# Patient Record
Sex: Female | Born: 1950 | Race: White | Hispanic: No | Marital: Married | Smoking: Never smoker
Health system: Southern US, Community
[De-identification: ages and names within clinical notes are randomized; demographics above are authoritative.]

## PROBLEM LIST (undated history)

## (undated) DIAGNOSIS — I639 Cerebral infarction, unspecified: Secondary | ICD-10-CM

## (undated) DIAGNOSIS — F0390 Unspecified dementia without behavioral disturbance: Secondary | ICD-10-CM

## (undated) DIAGNOSIS — R51 Headache: Secondary | ICD-10-CM

## (undated) DIAGNOSIS — R413 Other amnesia: Secondary | ICD-10-CM

## (undated) HISTORY — DX: Other amnesia: R41.3

## (undated) HISTORY — PX: SKIN CANCER EXCISION: SHX779

## (undated) HISTORY — DX: Headache: R51

## (undated) HISTORY — PX: MOUTH SURGERY: SHX715

---

## 2002-08-29 LAB — CONVERTED CEMR LAB: Pap Smear: NORMAL

## 2008-10-24 ENCOUNTER — Emergency Department (HOSPITAL_COMMUNITY): Admission: EM | Admit: 2008-10-24 | Discharge: 2008-10-24 | Payer: Self-pay | Admitting: Emergency Medicine

## 2009-01-15 ENCOUNTER — Ambulatory Visit: Payer: Self-pay | Admitting: Family Medicine

## 2009-01-16 LAB — CONVERTED CEMR LAB
ALT: 18 units/L (ref 0–35)
AST: 25 units/L (ref 0–37)
Albumin: 4.1 g/dL (ref 3.5–5.2)
Alkaline Phosphatase: 68 units/L (ref 39–117)
BUN: 9 mg/dL (ref 6–23)
Basophils Absolute: 0 10*3/uL (ref 0.0–0.1)
Basophils Relative: 0.4 % (ref 0.0–3.0)
Bilirubin, Direct: 0.1 mg/dL (ref 0.0–0.3)
CO2: 31 meq/L (ref 19–32)
Calcium: 10.1 mg/dL (ref 8.4–10.5)
Chloride: 105 meq/L (ref 96–112)
Cholesterol: 205 mg/dL — ABNORMAL HIGH (ref 0–200)
Creatinine, Ser: 0.8 mg/dL (ref 0.4–1.2)
Direct LDL: 145.3 mg/dL
Eosinophils Absolute: 0.2 10*3/uL (ref 0.0–0.7)
Eosinophils Relative: 3.9 % (ref 0.0–5.0)
Folate: >20 ng/mL
GFR calc non Af Amer: 77.73 mL/min (ref >60)
Glucose, Bld: 83 mg/dL (ref 70–99)
HCT: 40.1 % (ref 36.0–46.0)
HDL: 51.2 mg/dL (ref >39.00)
Hemoglobin: 13.9 g/dL (ref 12.0–15.0)
Lymphocytes Relative: 30.5 % (ref 12.0–46.0)
Lymphs Abs: 1.9 10*3/uL (ref 0.7–4.0)
MCHC: 34.5 g/dL (ref 30.0–36.0)
MCV: 86.2 fL (ref 78.0–100.0)
Monocytes Absolute: 0.5 10*3/uL (ref 0.1–1.0)
Monocytes Relative: 8.4 % (ref 3.0–12.0)
Neutro Abs: 3.7 10*3/uL (ref 1.4–7.7)
Neutrophils Relative %: 56.8 % (ref 43.0–77.0)
Platelets: 256 10*3/uL (ref 150.0–400.0)
Potassium: 3.6 meq/L (ref 3.5–5.1)
RBC: 4.65 M/uL (ref 3.87–5.11)
RDW: 12 % (ref 11.5–14.6)
Sodium: 142 meq/L (ref 135–145)
TSH: 1.32 microintl units/mL (ref 0.35–5.50)
Total Bilirubin: 0.6 mg/dL (ref 0.3–1.2)
Total CHOL/HDL Ratio: 4
Total Protein: 6.7 g/dL (ref 6.0–8.3)
Triglycerides: 62 mg/dL (ref 0.0–149.0)
VLDL: 12.4 mg/dL (ref 0.0–40.0)
Vitamin B-12: 226 pg/mL (ref 211–911)
WBC: 6.3 10*3/uL (ref 4.5–10.5)

## 2009-01-19 LAB — CONVERTED CEMR LAB

## 2009-01-30 ENCOUNTER — Ambulatory Visit: Payer: Self-pay | Admitting: Family Medicine

## 2009-01-31 ENCOUNTER — Encounter: Admission: RE | Admit: 2009-01-31 | Discharge: 2009-01-31 | Payer: Self-pay | Admitting: Family Medicine

## 2009-03-04 ENCOUNTER — Encounter: Payer: Self-pay | Admitting: Family Medicine

## 2009-03-09 ENCOUNTER — Encounter: Payer: Self-pay | Admitting: Family Medicine

## 2009-04-21 ENCOUNTER — Encounter: Payer: Self-pay | Admitting: Family Medicine

## 2009-04-22 ENCOUNTER — Other Ambulatory Visit: Admission: RE | Admit: 2009-04-22 | Discharge: 2009-04-22 | Payer: Self-pay | Admitting: Family Medicine

## 2009-04-22 ENCOUNTER — Encounter: Payer: Self-pay | Admitting: Family Medicine

## 2009-04-22 ENCOUNTER — Ambulatory Visit: Payer: Self-pay | Admitting: Family Medicine

## 2009-04-27 ENCOUNTER — Encounter (INDEPENDENT_AMBULATORY_CARE_PROVIDER_SITE_OTHER): Payer: Self-pay | Admitting: *Deleted

## 2009-04-27 ENCOUNTER — Ambulatory Visit: Payer: Self-pay | Admitting: Family Medicine

## 2009-04-28 ENCOUNTER — Encounter (INDEPENDENT_AMBULATORY_CARE_PROVIDER_SITE_OTHER): Payer: Self-pay | Admitting: *Deleted

## 2009-04-28 ENCOUNTER — Encounter: Admission: RE | Admit: 2009-04-28 | Discharge: 2009-04-28 | Payer: Self-pay | Admitting: Family Medicine

## 2009-04-29 ENCOUNTER — Encounter (INDEPENDENT_AMBULATORY_CARE_PROVIDER_SITE_OTHER): Payer: Self-pay | Admitting: *Deleted

## 2009-05-25 ENCOUNTER — Encounter: Payer: Self-pay | Admitting: Family Medicine

## 2009-06-23 ENCOUNTER — Ambulatory Visit: Payer: Self-pay | Admitting: Family Medicine

## 2009-06-24 ENCOUNTER — Ambulatory Visit: Payer: Self-pay | Admitting: Psychology

## 2009-06-24 LAB — CONVERTED CEMR LAB
ALT: 13 units/L (ref 0–35)
Direct LDL: 160.6 mg/dL
Total CHOL/HDL Ratio: 5
VLDL: 19 mg/dL (ref 0.0–40.0)

## 2009-07-13 ENCOUNTER — Encounter: Payer: Self-pay | Admitting: Family Medicine

## 2009-07-22 ENCOUNTER — Encounter: Payer: Self-pay | Admitting: Family Medicine

## 2010-05-26 ENCOUNTER — Ambulatory Visit: Payer: Self-pay | Admitting: Family Medicine

## 2010-05-28 ENCOUNTER — Encounter: Admission: RE | Admit: 2010-05-28 | Discharge: 2010-05-28 | Payer: Self-pay | Admitting: Family Medicine

## 2010-08-17 ENCOUNTER — Ambulatory Visit: Payer: Self-pay | Admitting: Family Medicine

## 2010-08-23 LAB — CONVERTED CEMR LAB
ALT: 16 units/L (ref 0–35)
BUN: 11 mg/dL (ref 6–23)
Basophils Relative: 0.6 % (ref 0.0–3.0)
Bilirubin, Direct: 0.1 mg/dL (ref 0.0–0.3)
CO2: 30 meq/L (ref 19–32)
Chloride: 107 meq/L (ref 96–112)
Cholesterol: 248 mg/dL — ABNORMAL HIGH (ref 0–200)
Creatinine, Ser: 0.6 mg/dL (ref 0.4–1.2)
Direct LDL: 170.3 mg/dL
Eosinophils Absolute: 0.4 10*3/uL (ref 0.0–0.7)
Eosinophils Relative: 5.8 % — ABNORMAL HIGH (ref 0.0–5.0)
HCT: 41.6 % (ref 36.0–46.0)
Lymphs Abs: 2.4 10*3/uL (ref 0.7–4.0)
MCHC: 34.3 g/dL (ref 30.0–36.0)
MCV: 88.1 fL (ref 78.0–100.0)
Monocytes Absolute: 0.5 10*3/uL (ref 0.1–1.0)
Neutrophils Relative %: 50.3 % (ref 43.0–77.0)
Platelets: 237 10*3/uL (ref 150.0–400.0)
Potassium: 4.1 meq/L (ref 3.5–5.1)
RBC: 4.72 M/uL (ref 3.87–5.11)
TSH: 3.22 microintl units/mL (ref 0.35–5.50)
Total Protein: 6.5 g/dL (ref 6.0–8.3)
WBC: 6.7 10*3/uL (ref 4.5–10.5)

## 2010-08-27 ENCOUNTER — Ambulatory Visit: Payer: Self-pay | Admitting: Family Medicine

## 2010-08-27 LAB — CONVERTED CEMR LAB: Cholesterol, target level: 200 mg/dL

## 2010-09-08 ENCOUNTER — Ambulatory Visit
Admission: RE | Admit: 2010-09-08 | Discharge: 2010-09-08 | Payer: Self-pay | Source: Home / Self Care | Attending: Family Medicine | Admitting: Family Medicine

## 2010-09-08 ENCOUNTER — Other Ambulatory Visit: Payer: Self-pay | Admitting: Family Medicine

## 2010-09-09 LAB — FECAL OCCULT BLOOD, IMMUNOCHEMICAL: Fecal Occult Bld: NEGATIVE

## 2010-09-30 NOTE — Letter (Signed)
Summary: [facility]: Immunoassay Fecal Occult Blood (iFOB) Order Form  [facility]  [address] [place], [place] [address]   Phone: [phone]  Fax: [phone]       Lab: Immunoassay Fecal Occult Blood (iFOB) Order Form   August 27, 2010 MRN: [id number]   Toni Sullivan 1951/08/10   Physicican Name:____________Bedsole_____________  Diagnosis Code:_______________________v76.41___      [name] MD

## 2010-09-30 NOTE — Assessment & Plan Note (Signed)
Summary: FLU SHOT/CLE  Nurse Visit   Allergies: 1)  ! Penicillin  Immunizations Administered:  Influenza Vaccine # 1:    Vaccine Type: Fluvax 3+    Site: left deltoid    Mfr: GlaxoSmithKline    Dose: 0.5 ml    Route: IM    Given by: Mervin Hack CMA (AAMA)    Exp. Date: 02/26/2011    Lot #: [id number]    VIS given: 03/23/10 version given May 26, 2010.  Flu Vaccine Consent Questions:    Do you have a history of severe allergic reactions to this vaccine? no    Any prior history of allergic reactions to egg and/or gelatin? no    Do you have a sensitivity to the preservative Thimersol? no    Do you have a past history of Guillan-Barre Syndrome? no    Do you currently have an acute febrile illness? no    Have you ever had a severe reaction to latex? no    Vaccine information given and explained to patient? yes    Are you currently pregnant? no  Orders Added: 1)  Flu Vaccine 23yrs + [90658] 2)  Admin 1st Vaccine [09811]

## 2010-09-30 NOTE — Assessment & Plan Note (Signed)
Summary: CPX/CLE   Vital Signs:  Patient profile:   60 year old female Height:      64.5 inches Weight:      143.50 pounds BMI:     24.34 Temp:     98.3 degrees F oral Pulse rate:   64 / minute Pulse rhythm:   regular BP sitting:   110 / 80  (left arm) Cuff size:   regular  Vitals Entered By: Benny Lennert CMA Duncan Dull) (August 27, 2010 2:00 PM)  History of Present Illness: Chief complaint cpx  60 year old female wih history of TIA and early memory loss...seeing neuro...dx with early Alzheimer's dz. On exelon and axona...stable..no progression.     Has had to quit work, husband has retired. Next appt with Dr. Vickey Huger in 10/2010. Walking daily. Able to ADLs and driving... main problem is word finding.   Doing fairly well without depression... occ crying spells goes away in 24 hours.  No SI.  Was on zocor 40 for cholesterol.. worsened control since last check.. Goal LDL <70. Does not appear she is taking cholesterol med.   Was not at goal on simvastatin previously...Marland Kitchenno SE.            Lipid Management History:      Positive NCEP/ATP III risk factors include female age 77 years old or older.  Negative NCEP/ATP III risk factors include non-tobacco-user status.     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 As Directed On Hands 2)  Exelon 9.5 Mg/24hr Pt24 (Rivastigmine) .... Once Daily 3)  Axona  Pack (Dietary Management Product) .... 4 8 Ounces Daily 4)  Omega-3 350 Mg Caps (Omega-3 Fatty Acids) .... One Tablet Daily 5)  Algal-900 Dha 300 Mg Caps (Docosahexaenoic Acid)  .... Daily 6)  Aspir-Low 81 Mg Tbec (Aspirin) .... Daily 7)  Vitamin D3 400 Unit Tabs (Cholecalciferol) .... Daily  Allergies: 1)  ! Penicillin  Past History:  Past medical, surgical, family and social histories (including risk factors) reviewed, and no changes noted (except as noted below).  Past Surgical History: Reviewed history from 03/27/2009 and no changes required. 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 Donzetta Starch EEG negative for seizure 02/2009  Family History: Reviewed history from 01/15/2009 and no changes required. father: unknown mother: heathy at age 3 brother: healthy no CAD, no  cancer  Social History: Reviewed history from 01/15/2009 and no changes required. Occupation: Technical brewer Married 2 children: healthy..except son alcoholic Never Smoked Alcohol use-yes, wine 2-3 times a month Drug use-no Regular exercise-yes, 3 times a week Diet: fruits and veggies, water, weight watcher dinner's  Review of Systems General:  Denies fatigue and fever. CV:  Denies chest pain or discomfort. Resp:  Denies shortness of breath. GI:  Denies abdominal pain. GU:  Denies dysuria.  Physical Exam  General:  Well-developed,well-nourished,in no acute distress; alert,appropriate and cooperative throughout examination Eyes:  No corneal or conjunctival inflammation noted. EOMI. Perrla. Funduscopic exam benign, without hemorrhages, exudates or papilledema. Vision grossly normal. Ears:  External ear exam shows no significant lesions or deformities.  Otoscopic examination reveals clear canals, tympanic membranes are intact  bilaterally without bulging, retraction, inflammation or discharge. Hearing is grossly normal bilaterally. Nose:  External nasal examination shows no deformity or inflammation. Nasal mucosa are pink and moist without lesions or exudates. Mouth:  Oral mucosa and oropharynx without lesions or exudates.  Teeth in good repair. Neck:  no carotid bruit or  thyromegaly no cervical or supraclavicular lymphadenopathy  Breasts:  per GYN Lungs:  Normal respiratory effort, chest expands symmetrically. Lungs are clear to auscultation, no crackles or wheezes. Heart:  Normal rate and regular rhythm. S1 and S2 normal without gallop, murmur, click, rub or other extra sounds. Abdomen:  Bowel sounds positive,abdomen soft and non-tender without masses, organomegaly or hernias noted. Genitalia:  per GYN Pulses:  R and L posterior tibial pulses are full and equal bilaterally  Extremities:  no edema  Neurologic:  No cranial nerve deficits noted.  Constantly asking about "whats the thing called.Marland Kitchen"Station and gait are normal. DTRs are symmetrical throughout. Sensory, motor and coordinative functions appear intact. Skin:  Intact without suspicious lesions or rashes Psych:  Cognition and judgment appear intact. Alert and cooperative with normal attention span and concentration. 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 Continue exelon and axona per Neuro. Reviewed notes in detail.   Problem # 2:  HYPERLIPIDEMIA (ICD-272.4) Poor control on or off simvastatin. Goal LDL <70. Start back on chol med at higher dose.. 80 mg daily.  The following medications were removed from the medication list:    Zocor 40 Mg Tabs (Simvastatin) .Marland Kitchen... Take 1 at bedtime daily Her updated medication list for this problem includes:    Lipitor 80 Mg Tabs (Atorvastatin calcium) .Marland Kitchen... 1 tab by mouth daily  Problem # 3:  SCREENING FOR MALIGNANT NEOPLASM OF THE RECTUM (ICD-V76.41) Stool cards.   Complete Medication List: 1)  Tretinoin 0.05 % Crea (Tretinoin) .... As directed on hands 2)  Exelon 9.5 Mg/24hr Pt24 (Rivastigmine) .... Once daily 3)  Axona Pack (Dietary management product) .... 4 8 ounces daily 4)  Omega-3 350 Mg Caps (Omega-3 fatty acids) .... One  tablet daily 5)  Algal-900 Dha 300 Mg Caps (Docosahexaenoic acid) .... Daily 6)  Aspir-low 81 Mg Tbec (Aspirin) .... Daily 7)  Vitamin D3 400 Unit Tabs (Cholecalciferol) .... Daily 8)  Lipitor 80 Mg Tabs (Atorvastatin calcium) .Marland Kitchen.. 1 tab by mouth daily  Lipid Assessment/Plan:      Based on NCEP/ATP III, the patient's risk factor category is "0-1 risk factors".  The patient's lipid goals are as follows: Total cholesterol goal is 200; LDL cholesterol goal is 160; HDL cholesterol goal is 40; Triglyceride goal is 150.  Her LDL cholesterol goal has not been met.    Patient Instructions: 1)  Stop simvastatin (zocor).. .change to lipitor 80 mg daily. 2)  Recheck fasting LIPIDS, AST,ALT  in 3 months Dx 272.0    3)   Return stool cards.  4)  Call insurance about whether they cover shingles vaccine.. call office if interested.  5)  Please schedule a follow-up appointment in 1 year.  Prescriptions: LIPITOR 80 MG TABS (ATORVASTATIN CALCIUM) 1 tab by mouth daily  #30 x 11   Entered and Authorized by:   [name] MD   Signed by:   [name] MD on 08/27/2010   Method used:   Electronically to        [facility]  [address] [phone]* (retail)  [address]       [place], [place]  [address]       Ph: [phone] or [phone]       Fax: [phone]   RxID:   [id number]    Orders Added: 1)  Est. Patient Level IV [27253]    Current Allergies (reviewed today): ! 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 Donzetta Starch       EEG negative for seizure 02/2009   Colonoscopy Next Due:  Refused Last PAP:  NEGATIVE FOR INTRAEPITHELIAL LESIONS OR MALIGNANCY. (04/22/2009 12:00:00 AM) PAP Result Date:  04/29/2010 PAP Result:  normal per GYN PAP Next Due:  1 yr Last Mammogram:  ASSESSMENT: Negative - BI-RADS 1^MM DIGITAL SCREENING (05/28/2010 2:12:00 PM) Mammogram Next Due:  1 yr

## 2010-11-25 ENCOUNTER — Other Ambulatory Visit: Payer: Self-pay | Admitting: Family Medicine

## 2010-11-25 DIAGNOSIS — E7800 Pure hypercholesterolemia, unspecified: Secondary | ICD-10-CM

## 2010-11-26 ENCOUNTER — Other Ambulatory Visit (INDEPENDENT_AMBULATORY_CARE_PROVIDER_SITE_OTHER): Payer: 59 | Admitting: Family Medicine

## 2010-11-26 DIAGNOSIS — E7800 Pure hypercholesterolemia, unspecified: Secondary | ICD-10-CM

## 2010-11-26 LAB — LIPID PANEL
Cholesterol: 142 mg/dL (ref 0–200)
HDL: 60.1 mg/dL (ref >39.00)
LDL Cholesterol: 74 mg/dL (ref 0–99)
VLDL: 7.6 mg/dL (ref 0.0–40.0)

## 2010-11-26 LAB — ALT: ALT: 24 U/L (ref 0–35)

## 2011-06-20 ENCOUNTER — Other Ambulatory Visit: Payer: Self-pay | Admitting: Family Medicine

## 2011-06-20 DIAGNOSIS — Z1231 Encounter for screening mammogram for malignant neoplasm of breast: Secondary | ICD-10-CM

## 2011-06-24 ENCOUNTER — Ambulatory Visit (HOSPITAL_COMMUNITY): Payer: 59

## 2011-07-12 ENCOUNTER — Ambulatory Visit (HOSPITAL_COMMUNITY)
Admission: RE | Admit: 2011-07-12 | Discharge: 2011-07-12 | Disposition: A | Discharge status: Home / Self Care | Payer: 59 | Source: Ambulatory Visit | Attending: Family Medicine | Admitting: Family Medicine

## 2011-07-12 DIAGNOSIS — Z1231 Encounter for screening mammogram for malignant neoplasm of breast: Secondary | ICD-10-CM

## 2011-09-09 ENCOUNTER — Other Ambulatory Visit: Payer: Self-pay | Admitting: Family Medicine

## 2012-02-28 ENCOUNTER — Other Ambulatory Visit: Payer: Self-pay | Admitting: Family Medicine

## 2012-07-17 ENCOUNTER — Other Ambulatory Visit: Payer: Self-pay | Admitting: Family Medicine

## 2012-07-19 ENCOUNTER — Telehealth: Payer: Self-pay | Admitting: Family Medicine

## 2012-07-19 ENCOUNTER — Other Ambulatory Visit (INDEPENDENT_AMBULATORY_CARE_PROVIDER_SITE_OTHER): Payer: 59

## 2012-07-19 DIAGNOSIS — E785 Hyperlipidemia, unspecified: Secondary | ICD-10-CM

## 2012-07-19 LAB — LIPID PANEL
HDL: 55.8 mg/dL (ref >39.00)
Total CHOL/HDL Ratio: 2
Triglycerides: 69 mg/dL (ref 0.0–149.0)
VLDL: 13.8 mg/dL (ref 0.0–40.0)

## 2012-07-19 LAB — COMPREHENSIVE METABOLIC PANEL
AST: 31 U/L (ref 0–37)
Albumin: 3.9 g/dL (ref 3.5–5.2)
Alkaline Phosphatase: 80 U/L (ref 39–117)
BUN: 11 mg/dL (ref 6–23)
Potassium: 3.8 mEq/L (ref 3.5–5.1)
Sodium: 142 mEq/L (ref 135–145)
Total Bilirubin: 0.7 mg/dL (ref 0.3–1.2)

## 2012-07-19 NOTE — Telephone Encounter (Signed)
Message copied by Excell Seltzer on Thu Jul 19, 2012  9:35 AM ------      Message from: Alvina Chou      Created: Tue Jul 10, 2012  3:56 PM      Regarding: lab orders for Thursday, 11.21.13       Patient is scheduled for CPX labs, please order future labs, Thanks , Camelia Eng

## 2012-07-27 ENCOUNTER — Ambulatory Visit (INDEPENDENT_AMBULATORY_CARE_PROVIDER_SITE_OTHER): Payer: 59 | Admitting: Family Medicine

## 2012-07-27 ENCOUNTER — Encounter: Payer: Self-pay | Admitting: Family Medicine

## 2012-07-27 VITALS — BP 102/60 | HR 69 | Temp 98.1°F | Ht 64.5 in | Wt 134.5 lb

## 2012-07-27 DIAGNOSIS — R531 Weakness: Secondary | ICD-10-CM | POA: Insufficient documentation

## 2012-07-27 DIAGNOSIS — F0280 Dementia in other diseases classified elsewhere without behavioral disturbance: Secondary | ICD-10-CM

## 2012-07-27 DIAGNOSIS — Z23 Encounter for immunization: Secondary | ICD-10-CM

## 2012-07-27 DIAGNOSIS — N959 Unspecified menopausal and perimenopausal disorder: Secondary | ICD-10-CM

## 2012-07-27 DIAGNOSIS — Z1231 Encounter for screening mammogram for malignant neoplasm of breast: Secondary | ICD-10-CM

## 2012-07-27 DIAGNOSIS — G309 Alzheimer's disease, unspecified: Secondary | ICD-10-CM

## 2012-07-27 DIAGNOSIS — E785 Hyperlipidemia, unspecified: Secondary | ICD-10-CM

## 2012-07-27 DIAGNOSIS — R5381 Other malaise: Secondary | ICD-10-CM

## 2012-07-27 DIAGNOSIS — Z1212 Encounter for screening for malignant neoplasm of rectum: Secondary | ICD-10-CM

## 2012-07-27 DIAGNOSIS — Z780 Asymptomatic menopausal state: Secondary | ICD-10-CM

## 2012-07-27 DIAGNOSIS — Z2911 Encounter for prophylactic immunotherapy for respiratory syncytial virus (RSV): Secondary | ICD-10-CM

## 2012-07-27 DIAGNOSIS — Z0000 Encounter for general adult medical examination without abnormal findings: Secondary | ICD-10-CM

## 2012-07-27 LAB — CBC WITH DIFFERENTIAL/PLATELET
Basophils Relative: 0.5 % (ref 0.0–3.0)
Eosinophils Absolute: 0.2 10*3/uL (ref 0.0–0.7)
HCT: 43.2 % (ref 36.0–46.0)
Lymphs Abs: 2.3 10*3/uL (ref 0.7–4.0)
MCHC: 32.5 g/dL (ref 30.0–36.0)
MCV: 89.8 fl (ref 78.0–100.0)
Monocytes Absolute: 0.5 10*3/uL (ref 0.1–1.0)
Neutrophils Relative %: 58 % (ref 43.0–77.0)
Platelets: 248 10*3/uL (ref 150.0–400.0)
RBC: 4.81 Mil/uL (ref 3.87–5.11)

## 2012-07-27 LAB — TSH: TSH: 1.35 u[IU]/mL (ref 0.35–5.50)

## 2012-07-27 NOTE — Addendum Note (Signed)
Addended by: Consuello Masse on: 07/27/2012 11:22 AM   Modules accepted: Orders

## 2012-07-27 NOTE — Progress Notes (Signed)
Subjective:    Patient ID: Toni Sullivan, female    DOB: 1950-09-13, 61 y.o.   MRN: [id number]  HPI  61 year old female with early Alzheimer's Followed by Neuro presents for CPX.  On exelon, axona and namenda for Alzheimer's  Pt's husband here today. Neuro is Dr. Vickey Huger.  Some progression. She cleans.  watches TV. Has stopped golf due to decreased energy. Sleeps well at night. No apnea spells.   Diet: limited...she picks at her food and eats little. Exercise: 2-3 miles 2 times a week.   Wt Readings from Last 3 Encounters:  07/27/12 134 lb 8 oz (61.009 kg)  08/27/10 143 lb 8 oz (65.091 kg)  04/22/09 144 lb 2.1 oz (65.378 kg)     Elevated Cholesterol: Well controlled with TIA history on atorvastatin Lab Results  Component Value Date   CHOL 133 07/19/2012   HDL 55.80 07/19/2012   LDLCALC 63 07/19/2012   LDLDIRECT 170.3 08/17/2010   TRIG 69.0 07/19/2012   CHOLHDL 2 07/19/2012    Using medications without problems: Muscle aches:  Diet compliance: Exercise: Other complaints:   Review of Systems  Constitutional: Positive for fatigue. Negative for fever and unexpected weight change.  HENT: Negative for ear pain, congestion, sore throat, sneezing, trouble swallowing and sinus pressure.   Eyes: Negative for pain and itching.  Respiratory: Negative for cough, shortness of breath and wheezing.   Cardiovascular: Negative for chest pain, palpitations and leg swelling.  Gastrointestinal: Negative for nausea, abdominal pain, diarrhea, constipation and blood in stool.  Genitourinary: Negative for dysuria, hematuria, vaginal discharge, difficulty urinating and menstrual problem.  Skin: Negative for rash.  Neurological: Positive for weakness. Negative for syncope, light-headedness, numbness and headaches.  Psychiatric/Behavioral: Negative for confusion and dysphoric mood. The patient is not nervous/anxious.        Objective:   Physical Exam  Constitutional: Vital signs are  normal. She appears well-developed and well-nourished. She is cooperative.  Non-toxic appearance. She does not appear ill. No distress.  HENT:  Head: Normocephalic.  Right Ear: Hearing, tympanic membrane, external ear and ear canal normal.  Left Ear: Hearing, tympanic membrane, external ear and ear canal normal.  Nose: Nose normal.  Eyes: Conjunctivae normal, EOM and lids are normal. Pupils are equal, round, and reactive to light. No foreign bodies found.  Neck: Trachea normal and normal range of motion. Neck supple. Carotid bruit is not present. No mass and no thyromegaly present.  Cardiovascular: Normal rate, regular rhythm, S1 normal, S2 normal, normal heart sounds and intact distal pulses.  Exam reveals no gallop.   No murmur heard. Pulmonary/Chest: Effort normal and breath sounds normal. No respiratory distress. She has no wheezes. She has no rhonchi. She has no rales.  Abdominal: Soft. Normal appearance and bowel sounds are normal. She exhibits no distension, no fluid wave, no abdominal bruit and no mass. There is no hepatosplenomegaly. There is no tenderness. There is no rebound, no guarding and no CVA tenderness. No hernia.  Genitourinary: Vagina normal and uterus normal. No breast swelling, tenderness, discharge or bleeding. Pelvic exam was performed with patient prone. There is no rash, tenderness or lesion on the right labia. There is no rash, tenderness or lesion on the left labia. Uterus is not enlarged and not tender. Right adnexum displays no mass, no tenderness and no fullness. Left adnexum displays no mass, no tenderness and no fullness.  Lymphadenopathy:    She has no cervical adenopathy.    She has no axillary adenopathy.  Neurological: She is alert. She has normal strength. No cranial nerve deficit or sensory deficit.  Skin: Skin is warm, dry and intact. No rash noted.  Psychiatric: Her speech is normal and behavior is normal. Judgment normal. Her mood appears not anxious.  Cognition and memory are normal. She does not exhibit a depressed mood.          Assessment & Plan:  The patient's preventative maintenance and recommended screening tests for an annual wellness exam were reviewed in full today. Brought up to date unless services declined.  Counselled on the importance of diet, exercise, and its role in overall health and mortality. The patient's FH and SH was reviewed, including their home life, tobacco status, and drug and alcohol status.   Vaccines: uptodate with TD,  flu and  wil get shingles vaccine today .Marland Kitchen  PAP/DVE: last pap 2011, on q 3 year schedule, DVE yearly  Mammo: will schedule DEXA: will schedule  Colon: ifob Nonsmoker

## 2012-07-27 NOTE — Patient Instructions (Addendum)
Stop at front desk for referral for mammogram and bone density.

## 2012-07-27 NOTE — Assessment & Plan Note (Signed)
Eval with labs. This is more decrease in strenght than fatogue.. Mentioned for them to discuss it with Dr. Vickey Huger as well.

## 2012-07-27 NOTE — Assessment & Plan Note (Signed)
Encouraged exercise, weight loss, healthy eating habits. Well controlled. Continue current medication.  

## 2012-08-20 ENCOUNTER — Other Ambulatory Visit: Payer: Self-pay | Admitting: Family Medicine

## 2012-09-11 ENCOUNTER — Encounter: Payer: Self-pay | Admitting: *Deleted

## 2012-09-11 ENCOUNTER — Ambulatory Visit
Admission: RE | Admit: 2012-09-11 | Discharge: 2012-09-11 | Disposition: A | Discharge status: Home / Self Care | Payer: 59 | Source: Ambulatory Visit | Attending: Family Medicine | Admitting: Family Medicine

## 2012-09-11 ENCOUNTER — Encounter: Payer: Self-pay | Admitting: Family Medicine

## 2012-09-11 DIAGNOSIS — N959 Unspecified menopausal and perimenopausal disorder: Secondary | ICD-10-CM

## 2012-09-11 DIAGNOSIS — Z1231 Encounter for screening mammogram for malignant neoplasm of breast: Secondary | ICD-10-CM

## 2012-09-21 ENCOUNTER — Other Ambulatory Visit: Payer: Self-pay | Admitting: Family Medicine

## 2012-10-23 ENCOUNTER — Other Ambulatory Visit: Payer: Self-pay | Admitting: Family Medicine

## 2012-11-16 ENCOUNTER — Other Ambulatory Visit: Payer: Self-pay | Admitting: Neurology

## 2012-11-20 ENCOUNTER — Other Ambulatory Visit: Payer: Self-pay | Admitting: Family Medicine

## 2012-12-10 ENCOUNTER — Other Ambulatory Visit: Payer: Self-pay | Admitting: Neurology

## 2012-12-22 ENCOUNTER — Other Ambulatory Visit: Payer: Self-pay | Admitting: Family Medicine

## 2013-01-03 ENCOUNTER — Other Ambulatory Visit: Payer: Self-pay

## 2013-01-03 MED ORDER — RIVASTIGMINE 13.3 MG/24HR TD PT24: 13.3000 mg | MEDICATED_PATCH | Freq: Every day | TRANSDERMAL | Status: DC

## 2013-01-29 ENCOUNTER — Other Ambulatory Visit: Payer: Self-pay | Admitting: Family Medicine

## 2013-03-03 ENCOUNTER — Other Ambulatory Visit: Payer: Self-pay | Admitting: Family Medicine

## 2013-04-01 ENCOUNTER — Other Ambulatory Visit: Payer: Self-pay | Admitting: Family Medicine

## 2013-05-09 ENCOUNTER — Ambulatory Visit: Payer: Self-pay | Admitting: Nurse Practitioner

## 2013-06-01 ENCOUNTER — Other Ambulatory Visit: Payer: Self-pay | Admitting: Family Medicine

## 2013-06-29 ENCOUNTER — Other Ambulatory Visit: Payer: Self-pay | Admitting: Neurology

## 2013-07-03 ENCOUNTER — Encounter: Payer: Self-pay | Admitting: Nurse Practitioner

## 2013-07-03 ENCOUNTER — Encounter (INDEPENDENT_AMBULATORY_CARE_PROVIDER_SITE_OTHER): Payer: Self-pay

## 2013-07-03 ENCOUNTER — Ambulatory Visit (INDEPENDENT_AMBULATORY_CARE_PROVIDER_SITE_OTHER): Payer: Medicare Other | Admitting: Nurse Practitioner

## 2013-07-03 VITALS — BP 106/72 | HR 58 | Ht 65.5 in | Wt 133.0 lb

## 2013-07-03 DIAGNOSIS — F0280 Dementia in other diseases classified elsewhere without behavioral disturbance: Secondary | ICD-10-CM

## 2013-07-03 DIAGNOSIS — I6350 Cerebral infarction due to unspecified occlusion or stenosis of unspecified cerebral artery: Secondary | ICD-10-CM

## 2013-07-03 DIAGNOSIS — Z8673 Personal history of transient ischemic attack (TIA), and cerebral infarction without residual deficits: Secondary | ICD-10-CM | POA: Insufficient documentation

## 2013-07-03 DIAGNOSIS — F05 Delirium due to known physiological condition: Secondary | ICD-10-CM

## 2013-07-03 MED ORDER — MEMANTINE HCL ER 28 MG PO CP24: 28.0000 mg | ORAL_CAPSULE | Freq: Every day | ORAL | Status: DC

## 2013-07-03 MED ORDER — RIVASTIGMINE 13.3 MG/24HR TD PT24: 13.3000 mg | MEDICATED_PATCH | Freq: Every day | TRANSDERMAL | Status: DC

## 2013-07-03 NOTE — Progress Notes (Signed)
[facility]  Sullivan: CHASITIE Toni Sullivan DOB: 1951-04-08   REASON FOR VISIT: Followup for Alzheimer's disease   HISTORY OF PRESENT ILLNESS: Toni Sullivan, 62 year old female returns for followup with Toni Sullivan for memory loss. Toni memory loss has been going on for about 3 years and she is disabled from working. He claims she has no problems with memory at home however she scored poorly on Mini-Mental Status exam at last visit, 9/30 and is crying and refuses Toni test today. Sullivan denies any behavior issues, no wondering, sleeping well at night. Appetite is only fair. He was unable to obtain Namenda XR so he stopped Toni medication. She remains on Toni Exelon patch.    11-11-11 Toni. Sullivan here for memory re evaluation- and states she scored only 9 points on MMSE and 4 on AFT because "Toni Sullivan made Toni mad". She no longer cooks, Toni Sullivan does, she plays golf and walks , she reads sometimes, not longer every day Toni Sullivan added. Never did balance Toni check book. He feels she is more impulsive. He is surprisingly supportive of Toni still driving at this advanced stage of dementia, which is characterized by more agitation and aphasia.  04/26/12. Returns for followup unaccompanied.  She feels Toni memory is stable, says she continues to drive to places in Toni community, very short distances, has not gotten lost. Denies any behavior issues, no wandering, sleeping well at night. See ROS.  HISTORY:This 62 year old, Caucasian female, returns for follow-up for memory loss beginning about 2 years ago, getting progressively worse. She has seen Dr. Leonides Cave, whose detailed neuropsychological battery test was showing a likely Alzheimer's dementia with Toni Sullivan scoring under average in parts of Toni test that she would be expected to score higher (by profession and education). She presents without major vascular component, nor Toni clinical manifestations of Lewy body type.  She has trouble  spelling, getting Toni password to Toni computer and calculation is impaired. Aricept gave Toni severe headaches. Toni MMSE scores have fluctuated. She is no longer working. She had more of an episodic character of confusion, disorientation at work, forgetting Toni password, Toni way to fill out a form that must be familiar to Toni. She got a bit better in feeling not so much under stress after quitting work.  She denies headaches.  Still has a problem with spelling.   She was  evaluated by Dr. Leonides Cave  October 2011. Dr. Albertina Senegal' office show normal labs, MRI in normal limits but MMT 23/30 in early June 2010. She had one spell of severe vertigo in March and was treated in Toni ER at [facility], placed on Meclizine.  She sometimes has noticed a flickering light in Toni peripheral visual field and these are not followed by a headache or dizziness. She is no longer on Meclizine. She continues to drive without difficulty.  She is accompanied today by Toni Sullivan.   She feels Toni combination of Axona and Exelon patches have stabilized Toni memory.  She continues to have problems when she feels rushed to do things especially by Toni Sullivan. Pt says she is playing golf weekly. She is also walking. She denies depression.  REVIEW OF SYSTEMS: Full 14 system review of systems performed and notable only for:  Constitutional: N/A  Cardiovascular: N/A  Ear/Nose/Throat: N/A  Skin: N/A  Eyes: N/A  Respiratory: N/A  Gastroitestinal: N/A  Hematology/Lymphatic: N/A  Endocrine: N/A Musculoskeletal:N/A  Allergy/Immunology: N/A  Neurological: Memory loss , confusion  Psychiatric: N/A  ALLERGIES: Allergies  Allergen Reactions  . Penicillins     HOME MEDICATIONS: Outpatient Prescriptions Prior to Visit  Medication Sig Dispense Refill  . atorvastatin (LIPITOR) 80 MG tablet TAKE 1 TABLET BY MOUTH ONCE A DAY  30 tablet  2  . Dietary Management Product (AXONA) packet 1 PACKET OF POWDER MIXED DAILY.  30 packet  12  . EXELON 13.3  MG/24HR PT24 PLACE 1 PATCH (13.3 MG TOTAL) ONTO Toni SKIN DAILY.  90 patch  1  . aspirin 81 MG tablet Take 81 mg by mouth daily.      . Docosahexaenoic Acid (ALGAL-900 DHA) 300 MG CAPS Take by mouth.      Marland Kitchen NAMENDA XR 28 MG CP24 TAKE 1 CAPSULE BY MOUTH ONCE A DAY  30 capsule  11  . tretinoin (RETIN-A) 0.05 % cream Apply topically at bedtime.       No facility-administered medications prior to visit.    PAST MEDICAL HISTORY: History reviewed. No pertinent past medical history.  PAST SURGICAL HISTORY: Past Surgical History  Procedure Laterality Date  . Skin cancer excision      FAMILY HISTORY: History reviewed. No pertinent family history.  SOCIAL HISTORY: History   Social History  . Marital Status: Married    Spouse Name: Earlene Plater    Number of Children: 2  . Years of Education: 12   Occupational History  . retired    Social History Main Topics  . Smoking status: Never Smoker   . Smokeless tobacco: Never Used  . Alcohol Use: 0.0 oz/week     Comment: 1 glass daily  . Drug Use: No  . Sexual Activity: Not on file   Other Topics Concern  . Not on file   Social History Narrative   Sullivan is married Earlene Plater) and lives with Toni Sullivan.   Sullivan has two children.   Sullivan is retired.   Sullivan has a high school education.   Sullivan is right-handed.   Sullivan drinks 5-6 cups of coffee daily.           PHYSICAL EXAM  Filed Vitals:   07/03/13 1403  BP: 106/72  Pulse: 58  Height: 5' 5.5" (1.664 m)  Weight: 133 lb (60.328 kg)   Body mass index is 21.79 kg/(m^2).  Generalized: Well developed, in no acute distress  Head: normocephalic and atraumatic,. Oropharynx benign  Neck: Supple, no carotid bruits  Cardiac: Regular rate rhythm, no murmur   Neurological examination   Mentation: Alert oriented to time, place, history taking. Follows all commands speech and language fluent  Cranial nerve II-XII: Pupils were equal round reactive to light extraocular  movements were full, visual field were full on confrontational test. Facial sensation and strength were normal. hearing was intact to finger rubbing bilaterally. Uvula tongue midline. head turning and shoulder shrug and were normal and symmetric.Tongue protrusion into cheek strength was normal. Motor: normal bulk and tone, full strength in Toni BUE, BLE, fine finger movements normal, no pronator drift. No focal weakness Coordination: finger-nose-finger, heel-to-shin bilaterally, apraxia with extremities  Reflexes: 1+ upper  lower and symmetric Gait and Station: Rising up from seated position without assistance, normal stance,, moderate stride, good arm swing, smooth turning, able to perform tiptoe, and heel walking without difficulty. Tandem gait steady  DIAGNOSTIC DATA (LABS, IMAGING, TESTING) -None to review  ASSESSMENT AND PLAN  61 y.o. year old female  has no past medical history on file. to followup for memory  Will restart Namenda, given starter pack and  coupon for 1 free month with RX  Refill Exelon Followup in 6 months Nilda Riggs, [facility], [facility], APRN  [facility] [address], [address] [place], [place] [address] [phone]

## 2013-07-03 NOTE — Patient Instructions (Signed)
Will restart Namenda, given starter pack and coupon for 1 free month Refill Exelon Followup in 6 months

## 2013-08-27 ENCOUNTER — Other Ambulatory Visit: Payer: Self-pay | Admitting: Family Medicine

## 2013-09-02 ENCOUNTER — Telehealth: Payer: Self-pay | Admitting: Nurse Practitioner

## 2013-09-02 NOTE — Telephone Encounter (Signed)
Patient's husband wants to discuss new medication Toni HymenBonnie is on and the effectiveness (Namenda XR). Please call back. GB

## 2013-09-03 ENCOUNTER — Other Ambulatory Visit: Payer: Self-pay

## 2013-09-03 MED ORDER — MEMANTINE HCL ER 28 MG PO CP24: 28.0000 mg | ORAL_CAPSULE | Freq: Every day | ORAL | Status: DC

## 2013-09-03 NOTE — Telephone Encounter (Signed)
TC to husband. Patient has had some nausea beginning with the Namenda but he has decided he will let her take it for another month and if nausea continues he will stop it. I am in agreement with that.

## 2013-09-30 ENCOUNTER — Other Ambulatory Visit: Payer: Self-pay | Admitting: Family Medicine

## 2013-11-01 ENCOUNTER — Other Ambulatory Visit: Payer: Self-pay | Admitting: Dermatology

## 2013-11-14 ENCOUNTER — Other Ambulatory Visit (INDEPENDENT_AMBULATORY_CARE_PROVIDER_SITE_OTHER): Payer: 59

## 2013-11-14 DIAGNOSIS — M8580 Other specified disorders of bone density and structure, unspecified site: Secondary | ICD-10-CM

## 2013-11-14 DIAGNOSIS — E785 Hyperlipidemia, unspecified: Secondary | ICD-10-CM

## 2013-11-14 DIAGNOSIS — Z0000 Encounter for general adult medical examination without abnormal findings: Secondary | ICD-10-CM

## 2013-11-14 LAB — COMPREHENSIVE METABOLIC PANEL
ALBUMIN: 4 g/dL (ref 3.5–5.2)
ALK PHOS: 77 U/L (ref 39–117)
ALT: 24 U/L (ref 0–35)
AST: 37 U/L (ref 0–37)
BUN: 15 mg/dL (ref 6–23)
CALCIUM: 9.3 mg/dL (ref 8.4–10.5)
CHLORIDE: 105 meq/L (ref 96–112)
CO2: 26 mEq/L (ref 19–32)
Creatinine, Ser: 0.8 mg/dL (ref 0.4–1.2)
GFR: 78.79 mL/min (ref >60.00)
GLUCOSE: 84 mg/dL (ref 70–99)
Potassium: 3.9 mEq/L (ref 3.5–5.1)
Sodium: 141 mEq/L (ref 135–145)
TOTAL PROTEIN: 6.1 g/dL (ref 6.0–8.3)
Total Bilirubin: 0.6 mg/dL (ref 0.3–1.2)

## 2013-11-14 LAB — CBC WITH DIFFERENTIAL/PLATELET
BASOS ABS: 0 10*3/uL (ref 0.0–0.1)
Basophils Relative: 0.4 % (ref 0.0–3.0)
EOS ABS: 0.3 10*3/uL (ref 0.0–0.7)
Eosinophils Relative: 5.1 % — ABNORMAL HIGH (ref 0.0–5.0)
HEMATOCRIT: 39.1 % (ref 36.0–46.0)
HEMOGLOBIN: 13 g/dL (ref 12.0–15.0)
LYMPHS ABS: 2.2 10*3/uL (ref 0.7–4.0)
Lymphocytes Relative: 38.9 % (ref 12.0–46.0)
MCHC: 33.3 g/dL (ref 30.0–36.0)
MCV: 89 fl (ref 78.0–100.0)
Monocytes Absolute: 0.4 10*3/uL (ref 0.1–1.0)
Monocytes Relative: 7 % (ref 3.0–12.0)
Neutro Abs: 2.7 10*3/uL (ref 1.4–7.7)
Neutrophils Relative %: 48.6 % (ref 43.0–77.0)
Platelets: 201 10*3/uL (ref 150.0–400.0)
RBC: 4.39 Mil/uL (ref 3.87–5.11)
RDW: 13.2 % (ref 11.5–14.6)
WBC: 5.6 10*3/uL (ref 4.5–10.5)

## 2013-11-14 LAB — LIPID PANEL
CHOLESTEROL: 118 mg/dL (ref 0–200)
HDL: 54.8 mg/dL (ref >39.00)
LDL Cholesterol: 53 mg/dL (ref 0–99)
Total CHOL/HDL Ratio: 2
Triglycerides: 49 mg/dL (ref 0.0–149.0)
VLDL: 9.8 mg/dL (ref 0.0–40.0)

## 2013-11-14 LAB — TSH: TSH: 1.91 u[IU]/mL (ref 0.35–5.50)

## 2013-11-15 LAB — VITAMIN D 25 HYDROXY (VIT D DEFICIENCY, FRACTURES): Vit D, 25-Hydroxy: 71 ng/mL (ref 30–89)

## 2013-11-20 ENCOUNTER — Encounter: Payer: Self-pay | Admitting: Family Medicine

## 2013-11-20 ENCOUNTER — Other Ambulatory Visit (HOSPITAL_COMMUNITY)
Admission: RE | Admit: 2013-11-20 | Discharge: 2013-11-20 | Disposition: A | Discharge status: Home / Self Care | Payer: Medicare HMO | Source: Ambulatory Visit | Attending: Family Medicine | Admitting: Family Medicine

## 2013-11-20 ENCOUNTER — Ambulatory Visit (INDEPENDENT_AMBULATORY_CARE_PROVIDER_SITE_OTHER): Payer: 59 | Admitting: Family Medicine

## 2013-11-20 VITALS — BP 122/72 | HR 50 | Temp 98.2°F | Ht 63.75 in | Wt 130.0 lb

## 2013-11-20 DIAGNOSIS — E785 Hyperlipidemia, unspecified: Secondary | ICD-10-CM

## 2013-11-20 DIAGNOSIS — Z0000 Encounter for general adult medical examination without abnormal findings: Secondary | ICD-10-CM

## 2013-11-20 DIAGNOSIS — Z1151 Encounter for screening for human papillomavirus (HPV): Secondary | ICD-10-CM | POA: Insufficient documentation

## 2013-11-20 DIAGNOSIS — Z124 Encounter for screening for malignant neoplasm of cervix: Secondary | ICD-10-CM

## 2013-11-20 DIAGNOSIS — Z1211 Encounter for screening for malignant neoplasm of colon: Secondary | ICD-10-CM

## 2013-11-20 DIAGNOSIS — Z01419 Encounter for gynecological examination (general) (routine) without abnormal findings: Secondary | ICD-10-CM | POA: Insufficient documentation

## 2013-11-20 NOTE — Progress Notes (Addendum)
HPI  63 year old female with early Alzheimer's followed by neuro presents for CPX.   On exelon, axona and namenda for Alzheimer's  Pt's husband here today.  Neuro is Dr. Vickey Huger.  Some progression.  She cleans. watches TV.  Has stopped golf due to decreased energy. Sleeps well at night. No apnea spells.  Diet: Improvement in eating habits.  Exercise: 2-3 miles 2 times a week.   Wt Readings from Last 3 Encounters:  11/20/13 130 lb (58.968 kg)  07/03/13 133 lb (60.328 kg)  07/27/12 134 lb 8 oz (61.009 kg)    Elevated Cholesterol: Well controlled with TIA history on atorvastatin  Lab Results  Component Value Date   CHOL 118 11/14/2013   HDL 54.80 11/14/2013   LDLCALC 53 11/14/2013   LDLDIRECT 170.3 08/17/2010   TRIG 49.0 11/14/2013   CHOLHDL 2 11/14/2013  Using medications without problems:  None Muscle aches: None Diet compliance:  Improved. Exercise:  Minimal, wlaks some. Other complaints:   Review of Systems  Constitutional: Negative for fatigue.. Negative for fever and unexpected weight change.  HENT: Negative for ear pain, congestion, sore throat, sneezing, trouble swallowing and sinus pressure.  Eyes: Negative for pain and itching.  Respiratory: Negative for cough, shortness of breath and wheezing.  Cardiovascular: Negative for chest pain, palpitations and leg swelling.  Gastrointestinal: Negative for nausea, abdominal pain, diarrhea, constipation and blood in stool.  Genitourinary: Negative for dysuria, hematuria, vaginal discharge, difficulty urinating and menstrual problem.  Skin: Negative for rash.  Neurological:  Negative for syncope, light-headedness, numbness and headaches.  Psychiatric/Behavioral: Negative for confusion and dysphoric mood. The patient is not nervous/anxious.  Objective:   Physical Exam  Constitutional: Vital signs are normal. She appears well-developed and well-nourished. She is cooperative. Non-toxic appearance. She does not appear ill. No  distress.  HENT:  Head: Normocephalic.  Right Ear: Hearing, tympanic membrane, external ear and ear canal normal.  Left Ear: Hearing, tympanic membrane, external ear and ear canal normal.  Nose: Nose normal.  Eyes: Conjunctivae normal, EOM and lids are normal. Pupils are equal, round, and reactive to light. No foreign bodies found.  Neck: Trachea normal and normal range of motion. Neck supple. Carotid bruit is not present. No mass and no thyromegaly present.  Cardiovascular: Normal rate, regular rhythm, S1 normal, S2 normal, normal heart sounds and intact distal pulses. Exam reveals no gallop.  No murmur heard.  Pulmonary/Chest: Effort normal and breath sounds normal. No respiratory distress. She has no wheezes. She has no rhonchi. She has no rales.  Abdominal: Soft. Normal appearance and bowel sounds are normal. She exhibits no distension, no fluid wave, no abdominal bruit and no mass. There is no hepatosplenomegaly. There is no tenderness. There is no rebound, no guarding and no CVA tenderness. No hernia.  Genitourinary: Vagina normal and uterus normal. No breast swelling, tenderness, discharge or bleeding. Pelvic exam was performed with patient supine. There is no rash, tenderness or lesion on the right labia. There is no rash, tenderness or lesion on the left labia. No cervical changes. Uterus is not enlarged and not tender. Right adnexum displays no mass, no tenderness and no fullness. Left adnexum displays no mass, no tenderness and no fullness.  PAP performed Hemeoccult negative. Lymphadenopathy:  She has no cervical adenopathy.  She has no axillary adenopathy.  Neurological: She is alert. She has normal strength. No cranial nerve deficit or sensory deficit.  Skin: Skin is warm, dry and intact. No rash noted.  Psychiatric: Her  speech is normal and behavior is normal. Judgment normal. Her mood appears not anxious. Cognition and memory are normal. She does not exhibit a depressed mood.   Assessment & Plan:   The patient's preventative maintenance and recommended screening tests for an annual wellness exam were reviewed in full today.  Brought up to date unless services declined.  Counselled on the importance of diet, exercise, and its role in overall health and mortality.  The patient's FH and SH was reviewed, including their home life, tobacco status, and drug and alcohol status.   Vaccines: uptodate with TD, flu and will get shingles vaccine today . PAP/DVE: last pap 2011, on q 3 year schedule, DVE yearly  Mammo: will schedule  DEXA: osteopenia 2014, on C and Vit D. Colon: She never returns ifob... Refuses. Would like to move forward with colonoscopy. Did do office hemeoccult.. Negative. Nonsmoker

## 2013-11-20 NOTE — Assessment & Plan Note (Signed)
Well controlled. Continue current medication.  

## 2013-11-20 NOTE — Progress Notes (Signed)
Pre visit review using our clinic review tool, if applicable. No additional management support is needed unless otherwise documented below in the visit note. 

## 2013-11-20 NOTE — Patient Instructions (Addendum)
Call to set up mammogram on your own. Continue Ca and vit d.  Work on increasing walking. Stop at front desk to set up colonoscopy.

## 2013-11-20 NOTE — Addendum Note (Signed)
Addended by: Damita LackLORING, Booker Bhatnagar S on: 11/20/2013 02:45 PM   Modules accepted: Orders

## 2013-11-22 ENCOUNTER — Encounter: Payer: Self-pay | Admitting: *Deleted

## 2013-11-28 ENCOUNTER — Other Ambulatory Visit: Payer: Self-pay | Admitting: Family Medicine

## 2013-12-01 ENCOUNTER — Other Ambulatory Visit: Payer: Self-pay | Admitting: Family Medicine

## 2013-12-26 ENCOUNTER — Other Ambulatory Visit: Payer: Self-pay | Admitting: Neurology

## 2014-01-01 ENCOUNTER — Encounter: Payer: Self-pay | Admitting: Family Medicine

## 2014-01-06 ENCOUNTER — Other Ambulatory Visit: Payer: Self-pay | Admitting: Neurology

## 2014-01-07 ENCOUNTER — Encounter: Payer: Self-pay | Admitting: Nurse Practitioner

## 2014-01-07 ENCOUNTER — Ambulatory Visit (INDEPENDENT_AMBULATORY_CARE_PROVIDER_SITE_OTHER): Payer: Medicare HMO | Admitting: Nurse Practitioner

## 2014-01-07 VITALS — BP 102/63 | HR 64 | Ht 64.75 in | Wt 124.0 lb

## 2014-01-07 DIAGNOSIS — G309 Alzheimer's disease, unspecified: Principal | ICD-10-CM

## 2014-01-07 DIAGNOSIS — F0280 Dementia in other diseases classified elsewhere without behavioral disturbance: Secondary | ICD-10-CM

## 2014-01-07 MED ORDER — AXONA PO PACK: 40.0000 g | PACK | Freq: Every day | ORAL | Status: DC

## 2014-01-07 MED ORDER — MEMANTINE HCL ER 28 MG PO CP24: 28.0000 mg | ORAL_CAPSULE | Freq: Every day | ORAL | Status: DC

## 2014-01-07 MED ORDER — RIVASTIGMINE 13.3 MG/24HR TD PT24: 13.3000 mg | MEDICATED_PATCH | Freq: Every day | TRANSDERMAL | Status: DC

## 2014-01-07 NOTE — Progress Notes (Signed)
[facility]  PATIENT: Toni Sullivan DOB: 04/25/1951   REASON FOR VISIT: Followup for Alzheimer's disease   HISTORY OF PRESENT ILLNESS: Toni Sullivan 63 year old female returns for followup with her husband. Her memory problems have been going on for about 5 years getting progressively worse. Neuropsych testing showing likely Alzheimer's dementia by Dr. Leonides CaveZelson. She is currently on Exelon patch and Namenda. MMSE score is 10/30 today. It has fluctuated in the past. There have been no behavior issues. According to the husband she is eating well and sleeping well. She returns for reevaluation  HISTORY:This 63 year old, Caucasian female, returns for follow-up for memory loss beginning about 2 years ago, getting progressively worse. She has seen Dr. Leonides CaveZelson, whose detailed neuropsychological battery test was showing a likely Alzheimer's dementia with the patient scoring under average in parts of the test that she would be expected to score higher (by profession and education). She presents without major vascular component, nor the clinical manifestations of Lewy body type.  She has trouble spelling, getting the password to her computer and calculation is impaired. Aricept gave her severe headaches.  Her MMSE scores have fluctuated. She is no longer working. She had more of an episodic character of confusion, disorientation at work, forgetting the password, the way to fill out a form that must be familiar to her. She got a bit better in feeling not so much under stress after quitting work. She denies headaches.  Still has a problem with spelling. She was evaluated by Dr. Leonides CaveZelson October 2011. Dr. Albertina SenegalBedsoles' office show normal labs, MRI in normal limits but MMT 23/30 in early June 2010. She had one spell of severe vertigo in March and [facility], placed on Meclizine.  She sometimes has noticed a flickering light in the peripheral visual field and these are not followed by a  headache or dizziness. She is no longer on Meclizine. She continues to drive without difficulty. She is accompanied today by her husband. She feels the combination of Axona and Exelon patches have stabilized her memory. She continues to have problems when she feels rushed to do things especially by her husband. Pt says she is playing golf weekly. She is also walking. She denies depression.      REVIEW OF SYSTEMS: Full 14 system review of systems performed and notable only for those listed, all others are neg:  Constitutional: N/A  Cardiovascular: N/A  Ear/Nose/Throat: N/A  Skin: N/A  Eyes: N/A  Respiratory: N/A  Gastroitestinal: N/A  Hematology/Lymphatic: N/A  Endocrine: N/A Musculoskeletal:N/A  Allergy/Immunology: N/A  Neurological: Memory loss Psychiatric: N/A Sleep : NA   ALLERGIES: Allergies  Allergen Reactions  . Penicillins     HOME MEDICATIONS: Outpatient Prescriptions Prior to Visit  Medication Sig Dispense Refill  . atorvastatin (LIPITOR) 80 MG tablet TAKE 1 TABLET BY MOUTH ONCE A DAY  30 tablet  11  . calcium carbonate (OS-CAL) 600 MG TABS tablet Take 600 mg by mouth. Take 2 tabs in the morning      . Dietary Management Product (AXONA) packet USE ONE PACKET OF POWDER MIXED DAILY  30 packet  0  . Memantine HCl ER (NAMENDA XR) 28 MG CP24 Take 28 mg by mouth daily.  30 capsule  6  . Multiple Vitamins-Minerals (QC WOMENS DAILY MULTIVITAMIN PO) Take by mouth.      . Rivastigmine (EXELON) 13.3 MG/24HR PT24 Apply 1 patch (13.3 mg total) topically daily.  90 patch  1  . EXELON 13.3  MG/24HR PT24 PLACE 1 PATCH (13.3 MG TOTAL) ONTO THE SKIN DAILY.  90 patch  0   No facility-administered medications prior to visit.    PAST MEDICAL HISTORY: Past Medical History  Diagnosis Date  . Memory loss   . Headache(784.0)     PAST SURGICAL HISTORY: Past Surgical History  Procedure Laterality Date  . Skin cancer excision      FAMILY HISTORY: History reviewed. No pertinent  family history.  SOCIAL HISTORY: History   Social History  . Marital Status: Married    Spouse Name: Earlene Plater    Number of Children: 2  . Years of Education: 12   Occupational History  . retired    Social History Main Topics  . Smoking status: Never Smoker   . Smokeless tobacco: Never Used  . Alcohol Use: 0.0 oz/week     Comment: 1 glass daily  . Drug Use: No  . Sexual Activity: Not on file   Other Topics Concern  . Not on file   Social History Narrative   [name] and lives with her husband.   Patient has two children.   Patient is retired.   Patient has a high school education.   Patient is right-handed.   Patient drinks 5-6 cups of coffee daily.           PHYSICAL EXAM  Filed Vitals:   01/07/14 1333  BP: 102/63  Pulse: 64  Height: 5' 4.75" (1.645 m)  Weight: 124 lb (56.246 kg)   Body mass index is 20.79 kg/(m^2).  Generalized: Well developed, in no acute distress , well groomed Head: normocephalic and atraumatic,. Oropharynx benign  Neck: Supple, no carotid bruits  Cardiac: Regular rate rhythm, no murmur  Musculoskeletal: No deformity   Neurological examination   Mentation: Alert MMSE 10/30. GDS=0. Follows some  commands, word finding problems.  Cranial nerve II-XII: Pupils were equal round reactive to light extraocular movements were full, visual field were full on confrontational test. Facial sensation and strength were normal. hearing was intact to finger rubbing bilaterally. Uvula tongue midline. head turning and shoulder shrug were normal and symmetric.Tongue protrusion into cheek strength was normal. Motor: normal bulk and tone, full strength in the BUE, BLE, No focal weakness Coordination: finger-nose-finger, heel-to-shin bilaterally, apraxia with extremities  Reflexes: 1+ upper lower and symmetric,  plantar responses were flexor bilaterally. Gait and Station: Rising up from seated position without assistance, normal stance,   moderate stride, good arm swing, smooth turning, able to perform tiptoe, and heel walking without difficulty. Tandem gait is steady  DIAGNOSTIC DATA (LABS, IMAGING, TESTING) - I reviewed patient records, labs, notes, testing and imaging myself where available.  Lab Results  Component Value Date   WBC 5.6 11/14/2013   HGB 13.0 11/14/2013   HCT 39.1 11/14/2013   MCV 89.0 11/14/2013   PLT 201.0 11/14/2013      Component Value Date/Time   NA 141 11/14/2013 1242   K 3.9 11/14/2013 1242   CL 105 11/14/2013 1242   CO2 26 11/14/2013 1242   GLUCOSE 84 11/14/2013 1242   BUN 15 11/14/2013 1242   CREATININE 0.8 11/14/2013 1242   CALCIUM 9.3 11/14/2013 1242   PROT 6.1 11/14/2013 1242   ALBUMIN 4.0 11/14/2013 1242   AST 37 11/14/2013 1242   ALT 24 11/14/2013 1242   ALKPHOS 77 11/14/2013 1242   BILITOT 0.6 11/14/2013 1242   GFRNONAA 103.76 08/17/2010 0825   Lab Results  Component Value Date   CHOL  118 11/14/2013   HDL 54.80 11/14/2013   LDLCALC 53 11/14/2013   LDLDIRECT 170.3 08/17/2010   TRIG 49.0 11/14/2013   CHOLHDL 2 11/14/2013    Lab Results  Component Value Date   TSH 1.91 11/14/2013      ASSESSMENT AND PLAN  63 y.o. year old female  has a past medical history of Alzheimer's dementia here to followup. Mini-Mental Status exam 10/30.   Continue Namenda , Exelon and Axona Rx to husband per request F/U in 6 months next appt with Dr. Vickey Hugerohmeier Nilda RiggsNancy Carolyn Zakaiya Lares, [facility] Hospital[facility]NP, [facility], APRN  [facility] [address], [address] [place], [place] [address] [phone]

## 2014-01-07 NOTE — Patient Instructions (Signed)
Continue Namenda , Exelon and Axona Rx to patient F/U in 6 months next appt with dr. Vickey Hugerohmeier

## 2014-01-08 NOTE — Progress Notes (Signed)
I agree with the assessment and plan as directed by NP .The patient is known to me .   Arles Rumbold, MD  

## 2014-01-28 ENCOUNTER — Telehealth: Payer: Self-pay | Admitting: Nurse Practitioner

## 2014-01-28 NOTE — Telephone Encounter (Signed)
I called the patient back.  Toni Sullivan will ask the pharmacy to only fill 30 days rather than 90 days.  They will call back if anything further is needed.

## 2014-01-28 NOTE — Telephone Encounter (Signed)
Spouse called requesting Rx for 30 day supply of Memantine HCl ER (NAMENDA XR) 28 MG CP24, due to 90 day supply is not affordable.

## 2014-02-13 ENCOUNTER — Other Ambulatory Visit: Payer: Self-pay | Admitting: Neurology

## 2014-03-03 ENCOUNTER — Other Ambulatory Visit: Payer: Self-pay | Admitting: *Deleted

## 2014-03-03 MED ORDER — ATORVASTATIN CALCIUM 80 MG PO TABS: ORAL_TABLET | ORAL | Status: DC

## 2014-03-03 NOTE — Telephone Encounter (Signed)
[facility] [place] [facility].

## 2014-03-26 ENCOUNTER — Other Ambulatory Visit: Payer: Self-pay | Admitting: Neurology

## 2014-05-28 ENCOUNTER — Other Ambulatory Visit: Payer: Self-pay | Admitting: Nurse Practitioner

## 2014-07-16 ENCOUNTER — Ambulatory Visit: Payer: Medicare HMO | Admitting: Neurology

## 2014-07-28 ENCOUNTER — Telehealth: Payer: Self-pay | Admitting: Nurse Practitioner

## 2014-07-28 MED ORDER — RIVASTIGMINE 13.3 MG/24HR TD PT24: 13.3000 mg | MEDICATED_PATCH | Freq: Every day | TRANSDERMAL | Status: DC

## 2014-07-28 NOTE — Telephone Encounter (Signed)
Patient's husband is calling to get a new Rx called in for generic Exelon patch. The insurance company will not pay for the brand name of Exelon patch. [facility] [place].

## 2014-09-02 ENCOUNTER — Encounter: Payer: Self-pay | Admitting: Neurology

## 2014-09-02 ENCOUNTER — Ambulatory Visit (INDEPENDENT_AMBULATORY_CARE_PROVIDER_SITE_OTHER): Payer: Medicare HMO | Admitting: Neurology

## 2014-09-02 VITALS — BP 113/72 | HR 60 | Resp 12 | Ht 64.75 in | Wt 141.0 lb

## 2014-09-02 DIAGNOSIS — G309 Alzheimer's disease, unspecified: Secondary | ICD-10-CM

## 2014-09-02 DIAGNOSIS — F0280 Dementia in other diseases classified elsewhere without behavioral disturbance: Secondary | ICD-10-CM

## 2014-09-02 MED ORDER — MEMANTINE HCL ER 28 MG PO CP24: 1.0000 | ORAL_CAPSULE | Freq: Every day | ORAL | Status: DC

## 2014-09-02 MED ORDER — AXONA PO PACK: PACK | ORAL | Status: DC

## 2014-09-02 MED ORDER — RIVASTIGMINE 13.3 MG/24HR TD PT24: 13.3000 mg | MEDICATED_PATCH | Freq: Every day | TRANSDERMAL | Status: DC

## 2014-09-02 NOTE — Progress Notes (Signed)
Provider:  Melvyn Novasarmen  Jerusalem Wert, M D  Referring Provider: Excell SeltzerBedsole, Amy E, MD Primary Care Physician:  Kerby NoraAmy Bedsole, MD  Chief Complaint  Patient presents with  . RV Memory    Rm 11, husband    HPI:  Toni Sullivan is a 64 y.o. married, right handed, caucasian female   ,  And seen here as a  revisit  from Dr. Ermalene SearingBedsole for  Alzheimer's dementia.  As his every 6 months revisit we performed today another Mini-Mental Status Examination. In the past [name] had sometimes high and sometimes low scores was a great variability. Today she had a very bad day. She scored only 5 out of 30 points on the Mini-Mental status exam is unable to an image or to draw a clock face. Her husband confirms that her current cognitive processes are represented by this test results. She is easily anxious and slightly agitated and restless and I think to some degree she is very frustrated when not able to answer questions.  Sleep is regular and deep, refreshed , and she wakes without headaches or pains. Her husband has noted her behavior to have changed, she wants all shades drawn, no  sun downing. She has not been driving for years, she lives  With her husband in the same private residence for over a decade. She has no physical ailments. Her appetite is very good, her weight is stable.      Review of Systems: Out of a complete 14 system review, the patient complains of only the following symptoms, and all other reviewed systems are negative.   History   Social History  . Marital Status: Married    Spouse Name: [name]    Number of Children: 2  . Years of Education: 12   Occupational History  . retired    Social History Main Topics  . Smoking status: Never Smoker   . Smokeless tobacco: Never Used  . Alcohol Use: 0.0 oz/week     Comment: 1 glass daily  . Drug Use: No  . Sexual Activity: Not on file   Other Topics Concern  . Not on file   Social History Narrative   [name]  and lives with her husband.   Patient has two children.   Patient is retired.   Patient has a high school education.   Patient is right-handed.   Patient drinks 5-6 cups of coffee daily.          History reviewed. No pertinent family history.  Past Medical History  Diagnosis Date  . Memory loss   . ZOXWRUEA(540.9Headache(784.0)     Past Surgical History  Procedure Laterality Date  . Skin cancer excision      Current Outpatient Prescriptions  Medication Sig Dispense Refill  . atorvastatin (LIPITOR) 80 MG tablet TAKE 1 TABLET BY MOUTH ONCE A DAY 90 tablet 2  . calcium carbonate (OS-CAL) 600 MG TABS tablet Take 600 mg by mouth. Take 1 tabs in the morning    . Dietary Management Product (AXONA) packet TAKE 1 PACKET OF POWDER BY MOUTH MIXED IN LIQUID DAILY AS DIRECTED 30 packet 6  . Multiple Vitamins-Minerals (QC WOMENS DAILY MULTIVITAMIN PO) Take by mouth.    Marland Kitchen. NAMENDA XR 28 MG CP24 TAKE 1 CAPSULE BY MOUTH DAILY 90 capsule 1  . Rivastigmine (EXELON) 13.3 MG/24HR PT24 Apply 1 patch (13.3 mg total) topically daily. 90 patch 0   No current facility-administered medications for this visit.  Allergies as of 09/02/2014 - Review Complete 09/02/2014  Allergen Reaction Noted  . Penicillins      Vitals: BP 113/72 mmHg  Pulse 60  Resp 12  Ht 5' 4.75" (1.645 m)  Wt 141 lb (63.957 kg)  BMI 23.64 kg/m2 Last Weight:  Wt Readings from Last 1 Encounters:  09/02/14 141 lb (63.957 kg)   Last Height:   Ht Readings from Last 1 Encounters:  09/02/14 5' 4.75" (1.645 m)    Physical exam:  General: The patient is awake, alert and appears not in acute distress. The patient is well groomed. Head: Normocephalic, atraumatic. Neck is supple. Mallampati 2 , neck circumference: 14  Cardiovascular:  Regular rate and rhythm , without  murmurs or carotid bruit, and without distended neck veins. Respiratory: Lungs are clear to auscultation. Skin:  Without evidence of edema, or rash Trunk: the  patient   has  A normal posture.  Neurologic exam : The patient is awake, pays attention to the people in the romm and visible frustrated about not being able to answer all questions. Looks towards her husband for help.   Memory  impaired, she is restless.  & concentration ability. Speech is not longer  without  dysarthria, dysphonia - hesitant rather than  aphasia.  Small talk preserved.  Poverty of speech. Unable to understand verbal instructions.  Mood and affect are appropriate. She no longer plays golf and now walks the dog twice a day.   Cranial nerves: Pupils are equal and briskly reactive to light. Funduscopic exam without evidence of pallor . Extraocular movements  in vertical and horizontal planes coarse but covering all planes without nystagmus.  Visual fields by finger perimetry are intact. Hearing to finger rub intact.   Facial sensation intact to fine touch. Facial motor strength is symmetric and tongue and uvula move midline.  Tongue protrusion into either cheek is normal. Shoulder shrug is normal.   Motor exam: Normal tone , muscle bulk and symmetric strength in all extremities.  Sensory:  Fine touch, pinprick and vibration were tested in all extremities. The patient is unable to state if she feels the vibration.   Coordination: Rapid alternating movements in the fingers, Finger-to-nose maneuver  without evidence of ataxia, dysmetria or tremor.  Gait and station: Patient walks without assistive device and is able unassisted to climb up to the exam table. Strength within normal limits. Stance is stable and normal.  Deep tendon reflexes: in the  upper and lower extremities are symmetric and intact. Babinski maneuver response is downgoing.   Assessment:  After physical and neurologic examination,  30 minute visit  With review :, i neurophysiology testing and pre-existing records, The patient had seen Dr. self in good and detailed neuropsychological battery test in 2012. At the time she  had noticed trouble spelling getting the passwords to the computer was difficult and calculation was at least slowed if not impaired. She tried Aricept which gave her severe headache at the time. An MRI was within normal limits but her Mini-Mental test in early June 2010 was 23 out of 30 and showed early impairment. In March 2011 she had severe vertigo and was treated in the emergency room and placed on meclizine. At that time she can discontinue to drive. The combination of axonal and Exelon patches had for a while stabilized her memory. Blood tests from March and December last year were reviewed and showed normal CBC, CMED and lipid panel normal TSH.   I will refill today the  moment teen Namenda in the Exelon patch. I also will refill her axona.    I suggested to investigate the adult enrichment center or center for adult patient enrichment on [employer].    assessment is that of :  Advancing dementia, now with 35 -30 points on MMSE . Alzheimer's disease .      Plan:  Treatment plan and additional workup : continue meds, day care facility recommended.  [place] and her husband can leave her alone for the play time.  Patient's husband is the only caretaker. Discussed burn out syndrome, day activities, respite care .        Porfirio Mylar Aislynn Cifelli MD 09/02/2014

## 2014-12-01 ENCOUNTER — Telehealth: Payer: Self-pay | Admitting: Family Medicine

## 2014-12-01 NOTE — Telephone Encounter (Signed)
Patient received phone call from [place] about flu shot.  [facility].

## 2014-12-04 ENCOUNTER — Telehealth: Payer: Self-pay | Admitting: Neurology

## 2014-12-04 ENCOUNTER — Other Ambulatory Visit: Payer: Self-pay

## 2014-12-04 MED ORDER — MEMANTINE HCL 10 MG PO TABS: 10.0000 mg | ORAL_TABLET | Freq: Two times a day (BID) | ORAL | Status: DC

## 2014-12-04 NOTE — Telephone Encounter (Signed)
Pt's husband is calling back stating he would like for the Generic drug ([facility]antine) to [facility] [address]  Please call and advise.

## 2014-12-04 NOTE — Telephone Encounter (Signed)
Patient would like to change from Namenda XR 28mg  daily to generic Namenda IR 10mg  twice daily due to difference in co-pay.  I will be happy to update and send Rx if permissible.  Dr Vickey Hugerohmeier and Eber Jonesarolyn are both out of the office.  [facility] to [facility] for review.

## 2014-12-04 NOTE — Telephone Encounter (Signed)
Hi, Jessica:  Please do so, I am ok with the switch. Thanks for your help.  Marvel PlanJindong Kimmy Totten, MD PhD Stroke Neurology 12/04/2014 5:09 PM

## 2014-12-04 NOTE — Telephone Encounter (Signed)
Rx updated and sent.  I called the patient back.  They are aware.

## 2014-12-04 NOTE — Telephone Encounter (Signed)
Pt's husband is calling stating that pt is in her medicare gap and the Rx are cost $170 for Memantine HCl ER (NAMENDA XR) 28 MG CP24. He wants to know if this medication is necessary or is there something else that she can try also with Rivastigmine (EXELON) 13.3 MG/24HR PT24. Please call and advise.

## 2014-12-04 NOTE — Telephone Encounter (Signed)
I called back and provided info for Patient Assistance with this medication [phone]).  They will contact the program to see if they qualify and call us back if anything further is needed.

## 2014-12-09 ENCOUNTER — Telehealth: Payer: Self-pay | Admitting: *Deleted

## 2014-12-09 NOTE — Telephone Encounter (Signed)
[facility] Hug[facility]ier 12-09-14.

## 2014-12-10 DIAGNOSIS — Z0289 Encounter for other administrative examinations: Secondary | ICD-10-CM

## 2014-12-11 NOTE — Telephone Encounter (Signed)
Pts husband aware.  Will await for Dr. Oliva Bustardohmeier's return.

## 2014-12-11 NOTE — Telephone Encounter (Signed)
I called and spoke to husband, [name].  Relating Dr. Vickey Hugerohmeier who saw pt last is out ot office and will return 12-23-14.  He asked if Eber JonesCarolyn who saw pt perviously would do.  I would forward note to her and if does to call him back.  (if not no need to call back).

## 2014-12-11 NOTE — Telephone Encounter (Signed)
Toni CampanileSandy I saw her in May of 2015. I do  Not feel comfortable writing this letter

## 2014-12-22 ENCOUNTER — Other Ambulatory Visit: Payer: Self-pay | Admitting: Family Medicine

## 2014-12-23 NOTE — Telephone Encounter (Signed)
Please call and schedule CPE with fasting labs prior with Dr. Bedsole sometime in the next three months. 

## 2014-12-26 NOTE — Telephone Encounter (Signed)
I called and LMVM for Mr. Dickey GaveMoricle [phone] about appt for pt needed prior to letter written.  Possible appt. 12-29-14 at 1030.( can use both the 1045 and 1115 slots to make 30 min).   I relayed would not make appt until having heard from them.

## 2014-12-26 NOTE — Telephone Encounter (Signed)
She needs a Rv with Np or me to have a recent face to face  [facility].

## 2014-12-31 ENCOUNTER — Telehealth: Payer: Self-pay | Admitting: Nurse Practitioner

## 2014-12-31 NOTE — Telephone Encounter (Signed)
Patients husband returned your call about possibly making an appt. For his wife. Please call and advise. (For some reason I could not add on to the previous telephone encounter but the call was on 4/29).

## 2015-01-01 ENCOUNTER — Encounter: Payer: Self-pay | Admitting: Neurology

## 2015-01-01 ENCOUNTER — Ambulatory Visit (INDEPENDENT_AMBULATORY_CARE_PROVIDER_SITE_OTHER): Payer: Medicare HMO | Admitting: Neurology

## 2015-01-01 VITALS — BP 116/78 | HR 80 | Resp 20

## 2015-01-01 DIAGNOSIS — F0280 Dementia in other diseases classified elsewhere without behavioral disturbance: Secondary | ICD-10-CM

## 2015-01-01 DIAGNOSIS — G309 Alzheimer's disease, unspecified: Secondary | ICD-10-CM | POA: Diagnosis not present

## 2015-01-01 NOTE — Progress Notes (Signed)
Provider:  Melvyn Novasarmen  Jacklyne Baik, M D  Referring Provider: Excell SeltzerBedsole, Amy E, MD Primary Care Physician:  Kerby NoraAmy Bedsole, MD  Chief Complaint  Patient presents with  . Memory Loss    wants a letter stating competency for POA, rm 11, husband    HPI:  Toni Sullivan is a 64 y.o. married, right handed, caucasian female , seen here as a  revisit from Dr. Ermalene SearingBedsole for Alzheimer's dementia.  Rv with  Mini-Mental Status Examination. In the past Toni Sullivan had sometimes high and sometimes low scores was a great variability.  Today she had a very bad day. She scored only 5 out of 30 points on the Mini-Mental status exam is unable to an image or to draw a clock face.  Her husband confirms that her current cognitive processes are represented by this test results. She is easily anxious and slightly agitated and restless and I think to some degree she is very frustrated when not able to answer questions. Sleep is regular and deep, refreshed , and she wakes without headaches or pains. Her husband has noted her behavior to have changed, she wants all shades drawn, no sun downing.  She has not been driving for years, she lives with her husband in the same private residence for over a decade.  She has arthritis related  physical ailments.  Her appetite is very good, her weight is stable.   01-01-15 CD Is Toni Sullivan is here today to undergo a competency examination this was requested by her and her husband's attorney. Her husband would like to become the power of attorney and healthcare decision-maker however Toni Sullivan is no longer considered competent to execute a power of attorney of healthcare decision delegation by herself. I performed today a geriatric depression scale which the patient is not able to comprehend or answer a Montral cognitive assessment test in which she scored 0 points she refused a clock drawing test . she was smiling  And giggling inappropriately throughout the attempted exam. She is not  hard of hearing and she does not have known visual deficits.    Review of Systems: Out of a complete 14 system review, the patient complains of only the following symptoms, and all other reviewed systems are negative.   History   Social History  . Marital Status: Married    Spouse Name: [name]  . Number of Children: 2  . Years of Education: 12   Occupational History  . retired    Social History Main Topics  . Smoking status: Never Smoker   . Smokeless tobacco: Never Used  . Alcohol Use: 0.0 oz/week     Comment: 1 glass daily  . Drug Use: No  . Sexual Activity: Not on file   Other Topics Concern  . Not on file   Social History Narrative   [name] and lives with her husband.   Patient has two children.   Patient is retired.   Patient has a high school education.   Patient is right-handed.   Patient drinks 5-6 cups of coffee daily.          No family history on file.  Past Medical History  Diagnosis Date  . Memory loss   . ZOXWRUEA(540.9Headache(784.0)     Past Surgical History  Procedure Laterality Date  . Skin cancer excision      Current Outpatient Prescriptions  Medication Sig Dispense Refill  . atorvastatin (LIPITOR) 80 MG tablet TAKE 1 TABLET BY  MOUTH EVERY DAY 30 tablet 2  . calcium carbonate (OS-CAL) 600 MG TABS tablet Take 600 mg by mouth. Take 1 tabs in the morning    . Dietary Management Product (AXONA) packet TAKE 1 PACKET OF POWDER BY MOUTH MIXED IN LIQUID DAILY AS DIRECTED 30 packet 6  . memantine (NAMENDA) 10 MG tablet Take 1 tablet (10 mg total) by mouth 2 (two) times daily. 180 tablet 1  . Multiple Vitamins-Minerals (QC WOMENS DAILY MULTIVITAMIN PO) Take by mouth.    . Rivastigmine (EXELON) 13.3 MG/24HR PT24 Apply 1 patch (13.3 mg total) topically daily. 90 patch 2   No current facility-administered medications for this visit.    Allergies as of 01/01/2015 - Review Complete 01/01/2015  Allergen Reaction Noted  . Penicillins       Vitals: There were no vitals taken for this visit. Last Weight:  Wt Readings from Last 1 Encounters:  09/02/14 141 lb (63.957 kg)   Last Height:   Ht Readings from Last 1 Encounters:  09/02/14 5' 4.75" (1.645 m)    Physical exam:  General: The patient is awake, alert and appears not in acute distress. The patient is well groomed. Head: Normocephalic, atraumatic. Neck is supple. Mallampati 1 , neck circumference: 12.25   Cardiovascular:  Regular rate and rhythm, without  murmurs or carotid bruit, and without distended neck veins. Respiratory: Lungs are clear to auscultation. Skin:  Without evidence of edema, or rash. She is very well groomed,  Trunk: the patient  has a normal posture.   Neurologic exam : The patient is awake, inappropriate affect,  Giggling.   Memory  impaired, she is restless  & there is no measurable  concentration ability. Speech  With dysphonia -    Small talk preserved.  Fluency of speech  reduced.  Poverty of speech.  Mood and affect are appropriate. She  Is talkative.  Cranial nerves: Pupils are equal and briskly reactive to light. Funduscopic exam without evidence of pallor.  Extraocular movements  in vertical and horizontal planes coarse but covering all planes without nystagmus.  Visual fields by finger perimetry are intact. Hearing to finger rub intact.   Facial sensation intact to fine touch. Facial motor strength is symmetric and tongue and uvula move midline.  Tongue protrusion into either cheek is normal. Shoulder shrug is normal.   Motor exam: Normal tone, muscle bulk and symmetric strength in all extremities. Sensory:  Fine touch, pinprick and vibration were tested in all extremities. The patient is unable to state if she feels the vibration.  Coordination: Rapid alternating movements in the fingers, Finger-to-nose maneuver  without evidence of ataxia, dysmetria or tremor. Gait and station: Patient walks without assistive device and is able  unassisted to climb up to the exam table. Strength within normal limits. Stance is stable and normal.  Deep tendon reflexes: in the  upper and lower extremities are symmetric and intact. Babinski maneuver response is downgoing.   Assessment:  After physical and neurologic examination,  15 minute visit  With review of  neurophysiology testing and pre-existing records,    Advancing dementia, now with 0 -30 points on  MOCA ,  Alzheimer's disease progressed.   NOT COMPETENT TO DESIGNATE A  FINANCIAL POA OR HEALTH CARE POA  .      Plan:  Treatment plan and additional workup : continue meds, day care facility recommended.  POA examination is usually done by PCP,    Patient's husband is the only caretaker.  Discussed burn  out syndrome, day activities, respite care .      Toni Mylar Cashis Rill MD 01/01/2015

## 2015-01-01 NOTE — Telephone Encounter (Signed)
I called husband back and appt made today 01-01-15 at 1400 (30 min), for face to face MOCA, requesting letter.

## 2015-01-02 ENCOUNTER — Telehealth: Payer: Self-pay | Admitting: *Deleted

## 2015-01-02 NOTE — Telephone Encounter (Signed)
Letter was given to patient at appointment by [facility] and Dr Vickey Hugerohmeier 01/01/15.

## 2015-02-23 ENCOUNTER — Other Ambulatory Visit: Payer: Self-pay

## 2015-03-05 ENCOUNTER — Ambulatory Visit: Payer: Medicare HMO | Admitting: Nurse Practitioner

## 2015-03-31 ENCOUNTER — Ambulatory Visit: Payer: Medicare HMO | Admitting: Nurse Practitioner

## 2015-04-12 ENCOUNTER — Other Ambulatory Visit: Payer: Self-pay | Admitting: Family Medicine

## 2015-04-12 NOTE — Telephone Encounter (Signed)
Last office visit 11/20/2013.  No future appointments scheduled.  Refill?

## 2015-04-14 NOTE — Telephone Encounter (Signed)
Needs appt before refill

## 2015-05-02 ENCOUNTER — Other Ambulatory Visit: Payer: Self-pay | Admitting: Family Medicine

## 2015-06-07 ENCOUNTER — Other Ambulatory Visit: Payer: Self-pay

## 2015-06-07 MED ORDER — AXONA PO PACK: PACK | ORAL | Status: DC

## 2015-06-16 ENCOUNTER — Ambulatory Visit (INDEPENDENT_AMBULATORY_CARE_PROVIDER_SITE_OTHER): Payer: Medicare HMO | Admitting: Nurse Practitioner

## 2015-06-16 ENCOUNTER — Encounter: Payer: Self-pay | Admitting: Nurse Practitioner

## 2015-06-16 VITALS — BP 119/74 | HR 64 | Ht 64.0 in | Wt 162.4 lb

## 2015-06-16 DIAGNOSIS — I6350 Cerebral infarction due to unspecified occlusion or stenosis of unspecified cerebral artery: Secondary | ICD-10-CM | POA: Diagnosis not present

## 2015-06-16 DIAGNOSIS — G309 Alzheimer's disease, unspecified: Secondary | ICD-10-CM

## 2015-06-16 DIAGNOSIS — F0280 Dementia in other diseases classified elsewhere without behavioral disturbance: Secondary | ICD-10-CM

## 2015-06-16 MED ORDER — MEMANTINE HCL 10 MG PO TABS: 10.0000 mg | ORAL_TABLET | Freq: Two times a day (BID) | ORAL | Status: DC

## 2015-06-16 MED ORDER — RIVASTIGMINE 13.3 MG/24HR TD PT24: 13.3000 mg | MEDICATED_PATCH | Freq: Every day | TRANSDERMAL | Status: DC

## 2015-06-16 NOTE — Progress Notes (Signed)
I agree with the assessment and plan as directed by NP .The patient is known to me .   Lilybeth Vien, MD  

## 2015-06-16 NOTE — Patient Instructions (Signed)
Continue Namenda at current dose will refill Continue Exelon patch at current dose will refill Follow-up in 6-8 months

## 2015-06-16 NOTE — Progress Notes (Signed)
[facility]  PATIENT: Toni Sullivan DOB: 12/04/1950   REASON FOR VISIT: follow-up for severe Alzheimer dementia HISTORY FROM:husband    HISTORY OF PRESENT ILLNESS: HISTORYBonnie G Sullivan is a 64 y.o. married, right handed, caucasian female , seen here as a revisit from Dr. Ermalene SearingBedsole for Alzheimer's dementia.  Rv with Mini-Mental Status Examination. In the past [name] had sometimes high and sometimes low scores was a great variability.  Today she had a very bad day. She scored only 5 out of 30 points on the Mini-Mental status exam is unable to an image or to draw a clock face. Her husband confirms that her current cognitive processes are represented by this test results. She is easily anxious and slightly agitated and restless and I think to some degree she is very frustrated when not able to answer questions. Sleep is regular and deep, refreshed , and she wakes without headaches or pains. Her husband has noted her behavior to have changed, she wants all shades drawn, no sun downing. She has not been driving for years, she lives with her husband in the same private residence for over a decade. She has arthritis related physical ailments.  Her appetite is very good, her weight is stable. 01-01-15 CD UPDATEMonical is here today to undergo a competency examination this was requested by her and her husband's attorney. Her husband would like to become the power of attorney and healthcare decision-maker however [name] is no longer considered competent to execute a power of attorney of healthcare decision delegation by herself. I performed today a geriatric depression scale which the patient is not able to comprehend or answer a Montral cognitive assessment test in which she scored 0 points she refused a clock drawing test . she was smiling And giggling inappropriately throughout the attempted exam. She is not hard of hearing and she does not have known visual  deficits.   UPDATE 06/16/2015.Toni Sullivan, 64 year old female returns for follow-up. She was last seen by Dr. Vickey Hugerohmeier 01/01/2015.she has a history of Alzheimer's dementia and has difficulty following commands. Her husband is now her power of attorney she is no longer able to make decisions. She is currently on Namenda and Exelon patch without reported side effects. She does not attend any adult daycare program. She looks to her husband to answer questions that are asked. He reports that she is eating well and sleeping well. No wandering behavior. She returns for reevaluation   REVIEW OF SYSTEMS: Full 14 system review of systems performed and notable only for those listed, all others are neg:  Constitutional: neg  Cardiovascular: neg Ear/Nose/Throat: neg  Skin: neg Eyes: neg Respiratory: neg Gastroitestinal: neg  Hematology/Lymphatic: neg  Endocrine: neg Musculoskeletal:neg Allergy/Immunology: neg Neurological: memory loss Psychiatric: neg Sleep : neg   ALLERGIES: Allergies  Allergen Reactions  . Penicillins     HOME MEDICATIONS: Outpatient Prescriptions Prior to Visit  Medication Sig Dispense Refill  . atorvastatin (LIPITOR) 80 MG tablet TAKE 1 TABLET BY MOUTH EVERY DAY 30 tablet 2  . calcium carbonate (OS-CAL) 600 MG TABS tablet Take 600 mg by mouth. Take 1 tabs in the morning    . Dietary Management Product (AXONA) packet TAKE 1 PACKET OF POWDER BY MOUTH MIXED IN LIQUID DAILY AS DIRECTED 30 packet 6  . memantine (NAMENDA) 10 MG tablet Take 1 tablet (10 mg total) by mouth 2 (two) times daily. 180 tablet 1  . Multiple Vitamins-Minerals (QC WOMENS DAILY MULTIVITAMIN PO) Take by mouth.    .Marland Kitchen  Rivastigmine (EXELON) 13.3 MG/24HR PT24 Apply 1 patch (13.3 mg total) topically daily. 90 patch 2   No facility-administered medications prior to visit.    PAST MEDICAL HISTORY: Past Medical History  Diagnosis Date  . Memory loss   . Headache(784.0)     PAST SURGICAL HISTORY: Past  Surgical History  Procedure Laterality Date  . Skin cancer excision    . Mouth surgery      FAMILY HISTORY: History reviewed. No pertinent family history.  SOCIAL HISTORY: Social History   Social History  . Marital Status: Married    Spouse Name: Earlene Plater  . Number of Children: 2  . Years of Education: 12   Occupational History  . retired    Social History Main Topics  . Smoking status: Never Smoker   . Smokeless tobacco: Never Used  . Alcohol Use: 0.0 oz/week     Comment: 1 glass daily  . Drug Use: No  . Sexual Activity: Not on file   Other Topics Concern  . Not on file   Social History Narrative   [name] and lives with her husband.   Patient has two children.   Patient is retired.   Patient has a high school education.   Patient is right-handed.   Patient drinks 5-6 cups of coffee daily.           PHYSICAL EXAM  Filed Vitals:   06/16/15 1050  BP: 119/74  Pulse: 64  Height:  (1.626 m)  Weight: 162 lb 6.4 oz (73.664 kg)   Body mass index is 27.86 kg/(m^2). General: The patient is awake, alert and appears not in acute distress. The patient is well groomed. Head: Normocephalic, atraumatic.  Neck is supple., no bruits   Cardiovascular: Regular rate and rhythm, without murmurs  Respiratory: Lungs are clear to auscultation. Skin: Without evidence of edema, or rash.  Neurologic exam : The patient is awake, inappropriate affect, Giggling.  Memory impaired, she is restless & there is no measurable concentration ability. MMSE 2/30. Speech With dysphonia -little speech Mood and affect are appropriate.  Cranial nerves: Pupils are equal and briskly reactive to light. Funduscopic exam without evidence of pallor. Extraocular movements in vertical and horizontal planes coarse but covering all planes without nystagmus. Unable to obtain Visual fields by finger perimetry .Hearing to finger rub intact.  Facial sensation  intact to fine touch. Facial motor strength is symmetric and tongue and uvula move midline. Tongue protrusion into either cheek is normal. Shoulder shrug is normal.  Motor exam: Normal tone, muscle bulk and symmetric strength in all extremities. Sensory:withdraws to pain   Coordination: Rapid alternating movements in the fingers, Finger-to-nose maneuver without evidence of ataxia, dysmetria or tremor. Gait and station: Patient walks without assistive device and is able unassisted to climb up to the exam table. Strength within normal limits. Stance is stable and normal.  Deep tendon reflexes: in the upper and lower extremities are symmetric and intact. Babinski maneuver response is downgoing. DIAGNOSTIC DATA (LABS, IMAGING, TESTING) -    ASSESSMENT AND PLAN  64 y.o. year old female  has a past medical history of severe Memory loss/ Alzheimer's dementia here to follow up.   Continue Namenda at current dose will refill Continue Exelon patch at current dose will refill I spent additional 5 minutes in total face to face time with the husband power of attorney  more than 50% of which was spent counseling and coordination of care, reviewing test results reviewing medications  and discussing and reviewing the diagnosis of Alzheimer's dementia. I would like for him to consider adult daycare at least for occasional respite. There  also needs  to be a plan B if something happens to him such as a hospitalization. Follow-up in 6-8 months Vst time 20 min Nilda Riggs, [facility], [facility], APRN  [facility] [address] [place], [place] [address] [phone]

## 2015-07-08 ENCOUNTER — Ambulatory Visit (INDEPENDENT_AMBULATORY_CARE_PROVIDER_SITE_OTHER): Payer: Medicare HMO

## 2015-07-08 DIAGNOSIS — Z23 Encounter for immunization: Secondary | ICD-10-CM

## 2015-07-30 ENCOUNTER — Encounter: Payer: Self-pay | Admitting: *Deleted

## 2015-07-30 NOTE — Progress Notes (Signed)
Letter written for husband who is primary caregiver.  His jury duty is Monday 08-03-15.  He sent in the summons with the request for excuse or postponement siting he was the caregiver for his wife who had alzheiemer's.  They requested letter from us stating this.  Husband and wife here and waited for letter, since needing it 08-03-15.

## 2015-09-08 ENCOUNTER — Telehealth: Payer: Self-pay | Admitting: Family Medicine

## 2015-09-08 ENCOUNTER — Ambulatory Visit (INDEPENDENT_AMBULATORY_CARE_PROVIDER_SITE_OTHER): Payer: PPO | Admitting: Primary Care

## 2015-09-08 ENCOUNTER — Encounter: Payer: Self-pay | Admitting: Primary Care

## 2015-09-08 ENCOUNTER — Ambulatory Visit (INDEPENDENT_AMBULATORY_CARE_PROVIDER_SITE_OTHER)
Admission: RE | Admit: 2015-09-08 | Discharge: 2015-09-08 | Disposition: A | Discharge status: Home / Self Care | Payer: PPO | Source: Ambulatory Visit | Attending: Primary Care | Admitting: Primary Care

## 2015-09-08 VITALS — BP 124/74 | HR 79 | Temp 97.9°F | Ht 64.0 in | Wt 168.8 lb

## 2015-09-08 DIAGNOSIS — M25512 Pain in left shoulder: Secondary | ICD-10-CM | POA: Diagnosis not present

## 2015-09-08 NOTE — Telephone Encounter (Signed)
Pt was in to see Toni Sullivan today for fall.  Spouse stated pt has alzheimer and refuses to see dr.  He wanted to know if she could have labs done and go from there on what she needs to do  She has not be taking her chol meds  He stated her hygiene has gotten bad and she has gain a lot weight From size 8 to 18 in 2 years

## 2015-09-08 NOTE — Progress Notes (Signed)
Subjective:    Patient ID: Toni Sullivan, female    DOB: Nov 24, 1950, 65 y.o.   MRN: [id number]  HPI  Toni Sullivan is a 65 year old female who presents today with a chief complaint of left shoulder pain. Her pain has been present since falling on 09/07/2015. She was trying to walk across the ice and fell from standing. Her husband was with her and attemtped to help her to the ground. The patient has a history of alzheimers and has been complaining of pain since the incident. She is non-contributory to the [facility] today. Her husband has noticed pain with movement of her arm, especially when putting on her coat. Denies bruising, swelling, obvious deformity. She did not hit her head on the ice. Her husband has not provided her with any medication for pain OTC and has not applied heat or ice.  Review of Systems  Musculoskeletal: Negative for back pain, joint swelling and neck pain.       Left shoulder pain.  Neurological: Negative for headaches.       Past Medical History  Diagnosis Date  . Memory loss   . Headache(784.0)     Social History   Social History  . Marital Status: Married    Spouse Name: Earlene Plater  . Number of Children: 2  . Years of Education: 12   Occupational History  . retired    Social History Main Topics  . Smoking status: Never Smoker   . Smokeless tobacco: Never Used  . Alcohol Use: 0.0 oz/week     Comment: 1 glass daily  . Drug Use: No  . Sexual Activity: Not on file   Other Topics Concern  . Not on file   Social History Narrative   [name] and lives with her husband.   Patient has two children.   Patient is retired.   Patient has a high school education.   Patient is right-handed.   Patient drinks 5-6 cups of coffee daily.          Past Surgical History  Procedure Laterality Date  . Skin cancer excision    . Mouth surgery      No family history on file.  Allergies  Allergen Reactions  . Penicillins     Current  Outpatient Prescriptions on File Prior to Visit  Medication Sig Dispense Refill  . atorvastatin (LIPITOR) 80 MG tablet TAKE 1 TABLET BY MOUTH EVERY DAY 30 tablet 2  . calcium carbonate (OS-CAL) 600 MG TABS tablet Take 600 mg by mouth. Take 1 tabs in the morning    . Dietary Management Product (AXONA) packet TAKE 1 PACKET OF POWDER BY MOUTH MIXED IN LIQUID DAILY AS DIRECTED 30 packet 6  . memantine (NAMENDA) 10 MG tablet Take 1 tablet (10 mg total) by mouth 2 (two) times daily. 60 tablet 6  . Multiple Vitamins-Minerals (QC WOMENS DAILY MULTIVITAMIN PO) Take by mouth.    . Rivastigmine (EXELON) 13.3 MG/24HR PT24 Apply 1 patch (13.3 mg total) topically daily. 30 patch 6   No current facility-administered medications on file prior to visit.    BP 124/74 mmHg  Pulse 79  Temp(Src) 97.9 F (36.6 C) (Oral)  Ht 5\' 4"  (1.626 m)  Wt 168 lb 12.8 oz (76.567 kg)  BMI 28.96 kg/m2  SpO2 98%    Objective:   Physical Exam  Constitutional: She appears well-nourished.  Cardiovascular: Normal rate and regular rhythm.   Pulmonary/Chest: Effort normal and breath sounds normal.  Musculoskeletal: Normal range of motion.  No complaints of pain during active and passive ROM. No obvious swelling or deformity noted to left shoulder or chest wall.  Neurological: She is alert.  Late stage alzheimers.  Skin: Skin is warm and dry.          Assessment & Plan:  Left Shoulder Pain;  S/p fall on 09/07/15. Patient had reported pain with movement yesterday. No complaints of pain during active and passive ROM in office today. No obvious deformity to chest wall. Will obtain xray today due to recent trauma and her late alzhemiers diagnosis. Xray returned and is unremarkable. Ibuprofen or tylenol PRN. Ice/heat PRN. Her husband is to call me if no improvement in pain in 2 weeks.

## 2015-09-08 NOTE — Telephone Encounter (Signed)
Okay to bring her in for labs if he is able to bring her.   She will need chol, CMET and hep C. Will order once scheduled.

## 2015-09-08 NOTE — Progress Notes (Signed)
Pre visit review using our clinic review tool, if applicable. No additional management support is needed unless otherwise documented below in the visit note. 

## 2015-09-08 NOTE — Patient Instructions (Signed)
Complete xray(s) prior to leaving today. I will notify you of your results once received.  Pain: Ibuprofen 600 mg three times daily for 3-4 days or Tylenol 500 mg three times daily for 3-4 days.  Apply ice/heat to shoulder for relief.   Please call if no improvement in pain in 2 weeks.  It was a pleasure seeing you!

## 2015-09-10 ENCOUNTER — Other Ambulatory Visit: Payer: Self-pay | Admitting: Family Medicine

## 2015-09-10 ENCOUNTER — Other Ambulatory Visit (INDEPENDENT_AMBULATORY_CARE_PROVIDER_SITE_OTHER): Payer: PPO

## 2015-09-10 DIAGNOSIS — Z1159 Encounter for screening for other viral diseases: Secondary | ICD-10-CM

## 2015-09-10 DIAGNOSIS — E785 Hyperlipidemia, unspecified: Secondary | ICD-10-CM | POA: Diagnosis not present

## 2015-09-10 LAB — COMPREHENSIVE METABOLIC PANEL
ALBUMIN: 3.8 g/dL (ref 3.5–5.2)
ALK PHOS: 99 U/L (ref 39–117)
ALT: 16 U/L (ref 0–35)
AST: 23 U/L (ref 0–37)
BILIRUBIN TOTAL: 0.4 mg/dL (ref 0.2–1.2)
BUN: 15 mg/dL (ref 6–23)
CALCIUM: 9.5 mg/dL (ref 8.4–10.5)
CO2: 29 mEq/L (ref 19–32)
Chloride: 107 mEq/L (ref 96–112)
Creatinine, Ser: 0.75 mg/dL (ref 0.40–1.20)
GFR: 81.98 mL/min (ref >60.00)
GLUCOSE: 94 mg/dL (ref 70–99)
POTASSIUM: 4 meq/L (ref 3.5–5.1)
Sodium: 141 mEq/L (ref 135–145)
TOTAL PROTEIN: 6.4 g/dL (ref 6.0–8.3)

## 2015-09-10 LAB — LIPID PANEL
CHOLESTEROL: 206 mg/dL — AB (ref 0–200)
HDL: 49.3 mg/dL (ref >39.00)
LDL Cholesterol: 140 mg/dL — ABNORMAL HIGH (ref 0–99)
NonHDL: 157.17
TRIGLYCERIDES: 87 mg/dL (ref 0.0–149.0)
Total CHOL/HDL Ratio: 4
VLDL: 17.4 mg/dL (ref 0.0–40.0)

## 2015-09-10 NOTE — Telephone Encounter (Signed)
i spoke with spouse he would like to bring pt in this morning for lab work

## 2015-09-11 LAB — HEPATITIS C ANTIBODY: HCV AB: NEGATIVE

## 2015-09-22 ENCOUNTER — Telehealth: Payer: Self-pay | Admitting: Neurology

## 2015-09-22 MED ORDER — RIVASTIGMINE 13.3 MG/24HR TD PT24: 13.3000 mg | MEDICATED_PATCH | Freq: Every day | TRANSDERMAL | Status: DC

## 2015-09-22 NOTE — Telephone Encounter (Signed)
Pt's husband called requesting refill for Rivastigmine (EXELON) 13.3 MG/24HR PT24 . Please refill with 90day supply. Please send to [facility] Fax # [phone]

## 2015-11-19 ENCOUNTER — Other Ambulatory Visit: Payer: Self-pay | Admitting: Neurology

## 2015-11-19 MED ORDER — MEMANTINE HCL 10 MG PO TABS: 10.0000 mg | ORAL_TABLET | Freq: Two times a day (BID) | ORAL | Status: DC

## 2015-11-19 NOTE — Telephone Encounter (Signed)
Namenda RX refilled and [facility] [place]

## 2015-11-19 NOTE — Telephone Encounter (Signed)
Pt's husband called requesting refill for memantine (NAMENDA) 10 MG tablet  90 day supply. [facility] [place] [facility]

## 2015-11-25 MED ORDER — MEMANTINE HCL 10 MG PO TABS: 10.0000 mg | ORAL_TABLET | Freq: Two times a day (BID) | ORAL | Status: DC

## 2015-11-25 NOTE — Telephone Encounter (Signed)
Called pt's husband to inform him that Dr. Vickey Hugerohmeier fixed the RX for namenda to a 90 day supply and it was sent to [facility]. No answer, left a detailed message per DPR on the home phone explaining this and asked them to call back for further questions.

## 2015-11-25 NOTE — Addendum Note (Signed)
Addended by: Geronimo RunningINKINS, Brick Ketcher A on: 11/25/2015 10:41 AM   Modules accepted: Orders

## 2015-11-25 NOTE — Telephone Encounter (Signed)
Spouse called back regarding request for 90 day refill of memantine (NAMENDA) 10 MG tablet, states pharmacy received the refill but it was only for 30 day supply and mail order pharmacy will only do a minimum of 90 day supply.

## 2015-12-15 ENCOUNTER — Telehealth: Payer: Self-pay | Admitting: Neurology

## 2015-12-15 MED ORDER — RIVASTIGMINE 13.3 MG/24HR TD PT24: 13.3000 mg | MEDICATED_PATCH | Freq: Every day | TRANSDERMAL | Status: DC

## 2015-12-15 NOTE — Telephone Encounter (Signed)
Exxelon patches refilled and sent to [facility].

## 2015-12-15 NOTE — Telephone Encounter (Signed)
Patient's husband is calling to order Rx Rivastigmine 13.3 MG/24 hr PT24 to [facility].  Thanks!

## 2016-02-02 ENCOUNTER — Telehealth: Payer: Self-pay | Admitting: Nurse Practitioner

## 2016-02-02 NOTE — Telephone Encounter (Signed)
Patient's husband is calling and states [facility] [place] says the pharmacy has quit making the Axona packet.  Is there something else that can be prescribed?  Thanks!

## 2016-02-02 NOTE — Telephone Encounter (Signed)
Do you have any recommendations for another dietary supplement for this pt since Axona is not being made?

## 2016-02-03 NOTE — Telephone Encounter (Signed)
I am not aware it is no longer made. Check another drug store.

## 2016-02-03 NOTE — Telephone Encounter (Signed)
[facility] [place]. They report that they are unable to get axona either (it is not being made by the manufacturer currently and there is not a new release date).  Do you know of any alternatives for axona? Or should patient speak to their PCP?

## 2016-02-03 NOTE — Telephone Encounter (Signed)
A diet with less carbohydrates and more protein would be the best way to go, there is no other supplement out that seems to do the same.   We can ask a nutritionist - Toni Sullivan.

## 2016-02-03 NOTE — Telephone Encounter (Signed)
Toni Sullivan was a  "ketonic" supplement, i did not know it was not longer produced, or did [facility] just stop carrying it ?  I have not investigated alternatives. I will. Let me send this to Darrol Angelarolyn martin, NP-

## 2016-02-18 ENCOUNTER — Ambulatory Visit (INDEPENDENT_AMBULATORY_CARE_PROVIDER_SITE_OTHER): Payer: PPO | Admitting: Nurse Practitioner

## 2016-02-18 ENCOUNTER — Encounter: Payer: Self-pay | Admitting: Nurse Practitioner

## 2016-02-18 DIAGNOSIS — G309 Alzheimer's disease, unspecified: Secondary | ICD-10-CM | POA: Diagnosis not present

## 2016-02-18 DIAGNOSIS — F0280 Dementia in other diseases classified elsewhere without behavioral disturbance: Secondary | ICD-10-CM

## 2016-02-18 MED ORDER — RIVASTIGMINE 13.3 MG/24HR TD PT24: 13.3000 mg | MEDICATED_PATCH | Freq: Every day | TRANSDERMAL | Status: DC

## 2016-02-18 NOTE — Patient Instructions (Signed)
Continue Namenda at current dose  Continue Exelon patch at current dose  [facility] for [facility] check into their programs [facility]  Follow up in 6 months next with Dr. Vickey Hugerohmeier

## 2016-02-18 NOTE — Progress Notes (Signed)
[facility]  PATIENT: Toni Sullivan DOB: 11/05/1950   REASON FOR VISIT: Follow-up for Alzheimer's dementia,  HISTORY FROM: Husband    HISTORY OF PRESENT ILLNESS: HISTORYBonnie G Sullivan is a 65 y.o. married, right handed, caucasian female , seen here as a revisit from Dr. Ermalene SearingBedsole for Alzheimer's dementia.  Rv with Mini-Mental Status Examination. In the past [name] had sometimes high and sometimes low scores was a great variability.  Today she had a very bad day. She scored only 5 out of 30 points on the Mini-Mental status exam is unable to an image or to draw a clock face. Her husband confirms that her current cognitive processes are represented by this test results. She is easily anxious and slightly agitated and restless and I think to some degree she is very frustrated when not able to answer questions. Sleep is regular and deep, refreshed , and she wakes without headaches or pains. Her husband has noted her behavior to have changed, she wants all shades drawn, no sun downing. She has not been driving for years, she lives with her husband in the same private residence for over a decade. She has arthritis related physical ailments.  Her appetite is very good, her weight is stable. 01-01-15 CD UPDATEMonical is here today to undergo a competency examination this was requested by her and her husband's attorney. Her husband would like to become the power of attorney and healthcare decision-maker however [name] is no longer considered competent to execute a power of attorney of healthcare decision delegation by herself. I performed today a geriatric depression scale which the patient is not able to comprehend or answer a Montral cognitive assessment test in which she scored 0 points she refused a clock drawing test . she was smiling And giggling inappropriately throughout the attempted exam. She is not hard of hearing and she does not have known visual  deficits.   UPDATE 06/16/2015.CMMs [place], 65 year old female returns for follow-up. She was last seen by Dr. Vickey Hugerohmeier 01/01/2015.she has a history of Alzheimer's dementia and has difficulty following commands. Her husband is now her power of attorney she is no longer able to make decisions. She is currently on Namenda and Exelon patch without reported side effects. She does not attend any adult daycare program. She looks to her husband to answer questions that are asked. He reports that she is eating well and sleeping well. No wandering behavior. She returns for reevaluation UPDATE 06/22/2017[name], 65 year old female returns for follow-up. She has a history of Alzheimer's dementia without behavioral disturbance. She returns for reevaluation. She no longer follows commands. Her husband is her power of attorney. She continues to have a good appetite no swallowing difficulty no safety issues identified. She looks to her husband to answer questions that are asked on exam. She is left alone 2 days a week while he gets out for respite for several hours. She has not fallen. She returns for reevaluation  REVIEW OF SYSTEMS: Full 14 system review of systems performed and notable only for those listed, all others are neg:  Constitutional: neg  Cardiovascular: neg Ear/Nose/Throat: neg  Skin: neg Eyes: neg Respiratory: neg Gastroitestinal: neg  Hematology/Lymphatic: neg  Endocrine: neg Musculoskeletal:neg Allergy/Immunology: neg Neurological: Memory loss Psychiatric: neg Sleep : neg   ALLERGIES: Allergies  Allergen Reactions  . Penicillins     HOME MEDICATIONS: Outpatient Prescriptions Prior to Visit  Medication Sig Dispense Refill  . calcium carbonate (OS-CAL) 600 MG TABS tablet Take  600 mg by mouth. Take 1 tabs in the morning    . memantine (NAMENDA) 10 MG tablet Take 1 tablet (10 mg total) by mouth 2 (two) times daily. 180 tablet 2  . Multiple Vitamins-Minerals (QC WOMENS DAILY  MULTIVITAMIN PO) Take by mouth.    . Rivastigmine (EXELON) 13.3 MG/24HR PT24 Apply 1 patch (13.3 mg total) topically daily. 90 patch 0  . atorvastatin (LIPITOR) 80 MG tablet TAKE 1 TABLET BY MOUTH EVERY DAY (Patient not taking: Reported on 02/18/2016) 30 tablet 2  . Dietary Management Product (AXONA) packet TAKE 1 PACKET OF POWDER BY MOUTH MIXED IN LIQUID DAILY AS DIRECTED (Patient not taking: Reported on 02/18/2016) 30 packet 6   No facility-administered medications prior to visit.    PAST MEDICAL HISTORY: Past Medical History  Diagnosis Date  . Memory loss   . Headache(784.0)     PAST SURGICAL HISTORY: Past Surgical History  Procedure Laterality Date  . Skin cancer excision    . Mouth surgery      FAMILY HISTORY: History reviewed. No pertinent family history.  SOCIAL HISTORY: Social History   Social History  . Marital Status: Married    Spouse Name: [name]  . Number of Children: 2  . Years of Education: 12   Occupational History  . retired    Social History Main Topics  . Smoking status: Never Smoker   . Smokeless tobacco: Never Used  . Alcohol Use: 0.0 oz/week     Comment: 1 glass daily  . Drug Use: No  . Sexual Activity: Not on file   Other Topics Concern  . Not on file   Social History Narrative   [name] and lives with her husband.   Patient has two children.   Patient is retired.   Patient has a high school education.   Patient is right-handed.   Patient drinks 5-6 cups of coffee daily.           PHYSICAL EXAM  Filed Vitals:   02/18/16 1053  BP: 104/76  Pulse: 64  Height: 5\' 4"  (1.626 m)  Weight: 167 lb 9.6 oz (76.023 kg)   Body mass index is 28.75 kg/(m^2). General: The patient is awake, alert and appears not in acute distress. The patient is well groomed. Head: Normocephalic, atraumatic.  Neck is supple., no bruits   Cardiovascular: Regular rate and rhythm, without murmurs  Respiratory: Lungs are clear to  auscultation. Skin: Without evidence of edema, or rash.  Neurologic exam : The patient is awake, inappropriate affect, Giggling.  Memory impaired, she is restless & there is no measurable concentration ability. MMSE unable to cooperate  -little speech Mood and affect are appropriate.  Cranial nerves:Pupils are equal and briskly reactive to light. Extraocular movements in vertical and horizontal planes coarse but covering all planes without nystagmus. Unable to obtain Visual fields by finger perimetry .Hearing to finger rub intact.  Facial sensation intact to fine touch. Facial motor strength is symmetric and tongue and uvula move midline. Tongue protrusion into either cheek is normal.   Motor exam: Normal tone, muscle bulk and symmetric strength in all extremities. Sensory:withdraws to pain  Coordination: Unable to perform Gait and station: Patient walks without assistive device Stance is stable and normal.  Deep tendon reflexes: in the upper and lower extremities are symmetric and intact. Babinski maneuver response is downgoing. DIAGNOSTIC DATA (LABS, IMAGING, TESTING) -    Component Value Date/Time   NA 141 09/10/2015 1122   K 4.0  09/10/2015 1122   CL 107 09/10/2015 1122   CO2 29 09/10/2015 1122   GLUCOSE 94 09/10/2015 1122   BUN 15 09/10/2015 1122   CREATININE 0.75 09/10/2015 1122   CALCIUM 9.5 09/10/2015 1122   PROT 6.4 09/10/2015 1122   ALBUMIN 3.8 09/10/2015 1122   AST 23 09/10/2015 1122   ALT 16 09/10/2015 1122   ALKPHOS 99 09/10/2015 1122   BILITOT 0.4 09/10/2015 1122   GFRNONAA 103.76 08/17/2010 0825   Lab Results  Component Value Date   CHOL 206* 09/10/2015   HDL 49.30 09/10/2015   LDLCALC 140* 09/10/2015   LDLDIRECT 170.3 08/17/2010   TRIG 87.0 09/10/2015   CHOLHDL 4 09/10/2015      ASSESSMENT AND PLAN 65 y.o. year old female has a past medical history of severe Memory loss/ Alzheimer's dementia here to follow up.   Continue Namenda  at current dose  Continue Exelon patch at current dose [facility] check into their programs [employer] for older adults check [facility]  Follow up in 6 months next with Dr. Vickey Huger Additional questions answered. There also needs to be a plan B if something happens to him such as a hospitalization. Nilda Riggs, [facility], [facility], APRN  [facility] [address], [address] [place], [place] [address] [phone]

## 2016-02-18 NOTE — Progress Notes (Signed)
I agree with the assessment and plan as directed by NP .The patient is known to me .   Ruta Capece, MD  

## 2016-03-14 ENCOUNTER — Telehealth: Payer: Self-pay | Admitting: Neurology

## 2016-03-14 NOTE — Telephone Encounter (Signed)
I spoke to [facility]. The RX for exelon patches was last refilled on 12/17/15. They will send out a shipment tomorrow.  I spoke to pt's husband and advised him of this. Pt's husband verbalized understanding.

## 2016-03-14 NOTE — Telephone Encounter (Signed)
Patient's husband is calling to get a 90 day supply Rx Rivastigmine 13.3 MG/24 HR PT24 sent to [facility].

## 2016-04-26 ENCOUNTER — Telehealth: Payer: Self-pay | Admitting: Nurse Practitioner

## 2016-04-26 DIAGNOSIS — G309 Alzheimer's disease, unspecified: Principal | ICD-10-CM

## 2016-04-26 DIAGNOSIS — F0280 Dementia in other diseases classified elsewhere without behavioral disturbance: Secondary | ICD-10-CM

## 2016-04-26 NOTE — Telephone Encounter (Signed)
Do you want to send to palliative care?

## 2016-04-26 NOTE — Telephone Encounter (Signed)
Ok to send I dont think they do custodial care. I had given them numbers for respite at last visit.

## 2016-04-26 NOTE — Telephone Encounter (Signed)
Pt called said he needs help in taking care of her. Hospice advised provider could send palliative care order to help. Please call

## 2016-04-27 NOTE — Telephone Encounter (Signed)
Spoke to husband.   He called and spoke to [facility] of [place] [phone] and they do care for chronic illness, ALZ pts.  He would like copy of referral sent to his address too.  [facility] and gave her some information.  They do service [place], [place].  They have access to EPIC.  Referral placed.

## 2016-05-03 DIAGNOSIS — R531 Weakness: Secondary | ICD-10-CM | POA: Diagnosis not present

## 2016-06-14 ENCOUNTER — Ambulatory Visit (INDEPENDENT_AMBULATORY_CARE_PROVIDER_SITE_OTHER): Payer: PPO

## 2016-06-14 DIAGNOSIS — Z23 Encounter for immunization: Secondary | ICD-10-CM

## 2016-06-27 ENCOUNTER — Telehealth: Payer: Self-pay | Admitting: Nurse Practitioner

## 2016-06-27 MED ORDER — MEMANTINE HCL 10 MG PO TABS: 10.0000 mg | ORAL_TABLET | Freq: Two times a day (BID) | ORAL | 1 refills | Status: DC

## 2016-06-27 MED ORDER — RIVASTIGMINE 13.3 MG/24HR TD PT24: 13.3000 mg | MEDICATED_PATCH | Freq: Every day | TRANSDERMAL | 3 refills | Status: DC

## 2016-06-27 NOTE — Telephone Encounter (Signed)
Re ordered to pharmacy's per request.

## 2016-06-27 NOTE — Telephone Encounter (Signed)
Pt's husband called request refill for memantine (NAMENDA) 10 MG tablet 60 day supply sent to [facility]. Rivastigmine (EXELON) 13.3 MG/24HR PT24 30 day to [facility].  He said the pt is in the "doughnut hole" right now and he is trying to find the cheapest pharmacy to get these meds.

## 2016-07-28 ENCOUNTER — Telehealth: Payer: Self-pay | Admitting: Family Medicine

## 2016-07-28 NOTE — Telephone Encounter (Signed)
LVM for pt to call back and schedule AWV + labs with Lesia and CPE with PCP. °

## 2016-09-01 ENCOUNTER — Encounter: Payer: Self-pay | Admitting: Nurse Practitioner

## 2016-09-01 ENCOUNTER — Ambulatory Visit (INDEPENDENT_AMBULATORY_CARE_PROVIDER_SITE_OTHER): Payer: PPO | Admitting: Nurse Practitioner

## 2016-09-01 VITALS — HR 66 | Ht 64.0 in | Wt 164.8 lb

## 2016-09-01 DIAGNOSIS — R443 Hallucinations, unspecified: Secondary | ICD-10-CM

## 2016-09-01 DIAGNOSIS — R451 Restlessness and agitation: Secondary | ICD-10-CM

## 2016-09-01 DIAGNOSIS — F0280 Dementia in other diseases classified elsewhere without behavioral disturbance: Secondary | ICD-10-CM | POA: Diagnosis not present

## 2016-09-01 DIAGNOSIS — G309 Alzheimer's disease, unspecified: Secondary | ICD-10-CM

## 2016-09-01 MED ORDER — RIVASTIGMINE 13.3 MG/24HR TD PT24: 13.3000 mg | MEDICATED_PATCH | Freq: Every day | TRANSDERMAL | 1 refills | Status: AC

## 2016-09-01 MED ORDER — MEMANTINE HCL 10 MG PO TABS: 10.0000 mg | ORAL_TABLET | Freq: Two times a day (BID) | ORAL | 1 refills | Status: DC

## 2016-09-01 MED ORDER — DIVALPROEX SODIUM 125 MG PO CSDR: 125.0000 mg | DELAYED_RELEASE_CAPSULE | Freq: Three times a day (TID) | ORAL | 1 refills | Status: DC

## 2016-09-01 NOTE — Progress Notes (Signed)
[facility]  PATIENT: Toni Sullivan DOB: [birth year]   REASON FOR VISIT: Follow-up for Alzheimer's dementia, now with agitation HISTORY FROM: Husband who is her POA    HISTORY OF PRESENT ILLNESS: HISTORYBonnie G Sullivan is a 66 y.o. married, right handed, caucasian female , seen here as a revisit from Dr. Ermalene Searing for Alzheimer's dementia.  Rv with Mini-Mental Status Examination. In the past Toni Sullivan had sometimes high and sometimes low scores was a great variability.  Today she had a very bad day. She scored only 5 out of 30 points on the Mini-Mental status exam is unable to an image or to draw a clock face. Her husband confirms that her current cognitive processes are represented by this test results. She is easily anxious and slightly agitated and restless and I think to some degree she is very frustrated when not able to answer questions. Sleep is regular and deep, refreshed , and she wakes without headaches or pains. Her husband has noted her behavior to have changed, she wants all shades drawn, no sun downing. She has not been driving for years, she lives with her husband in the same private residence for over a decade. She has arthritis related physical ailments.  Her appetite is very good, her weight is stable. 01-01-15 CD UPDATEMonical is here today to undergo a competency examination this was requested by her and her husband's attorney. Her husband would like to become the power of attorney and healthcare decision-maker however Toni Sullivan is no longer considered competent to execute a power of attorney of healthcare decision delegation by herself. I performed today a geriatric depression scale which the patient is not able to comprehend or answer a Montral cognitive assessment test in which she scored 0 points she refused a clock drawing test . she was smiling And giggling inappropriately throughout the attempted exam. She is not hard of hearing and  she does not have known visual deficits.   UPDATE 06/16/2015.CMMs Opd[place], 66 year old female returns for follow-up. She was last seen by Dr. Vickey Huger 01/01/2015.she has a history of Alzheimer's dementia and has difficulty following commands. Her husband is now her power of attorney she is no longer able to make decisions. She is currently on Namenda and Exelon patch without reported side effects. She does not attend any adult daycare program. She looks to her husband to answer questions that are asked. He reports that she is eating well and sleeping well. No wandering behavior. She returns for reevaluation UPDATE 06/22/2017[name], 66 year old female returns for follow-up. She has a history of Alzheimer's dementia without behavioral disturbance. She returns for reevaluation. She no longer follows commands. Her husband is her power of attorney. She continues to have a good appetite no swallowing difficulty no safety issues identified. She looks to her husband to answer questions that are asked on exam. She is left alone 2 days a week while he gets out for respite for several hours. She has not fallen. She returns for reevaluation  UPDATE 01/04/2018[name], 66 year old female returns with her husband. She has a history of Alzheimer's dementia without behavior disturbance previously but is now becoming more agitated. Husband also reports that she has occasional hallucinations. She is uncooperative with exam today. She does not follow commands. She will go a week for 2 weeks without changing clothes or taking a bath. Home instead comes once a week for several hours for respite. He reports that he is getting ready to activate a long-term care  policy. She returns for reevaluation   REVIEW OF SYSTEMS: Full 14 system review of systems performed and notable only for those listed, all others are neg:  Constitutional: neg  Cardiovascular: neg Ear/Nose/Throat: neg  Skin: neg Eyes: neg Respiratory:  neg Gastroitestinal: neg  Hematology/Lymphatic: neg  Endocrine: neg Musculoskeletal:neg Allergy/Immunology: neg Neurological: Memory loss Psychiatric: Agitation behavior problem confusion, rare hallucination Sleep : neg   ALLERGIES: Allergies  Allergen Reactions  . Penicillins     HOME MEDICATIONS: Outpatient Medications Prior to Visit  Medication Sig Dispense Refill  . memantine (NAMENDA) 10 MG tablet Take 1 tablet (10 mg total) by mouth 2 (two) times daily. 120 tablet 1  . Rivastigmine (EXELON) 13.3 MG/24HR PT24 Apply 1 patch (13.3 mg total) topically daily. 30 patch 3  . Dietary Management Product (AXONA) packet TAKE 1 PACKET OF POWDER BY MOUTH MIXED IN LIQUID DAILY AS DIRECTED (Patient not taking: Reported on 09/01/2016) 30 packet 6  . atorvastatin (LIPITOR) 80 MG tablet TAKE 1 TABLET BY MOUTH EVERY DAY (Patient not taking: Reported on 09/01/2016) 30 tablet 2  . calcium carbonate (OS-CAL) 600 MG TABS tablet Take 600 mg by mouth. Take 1 tabs in the morning    . Multiple Vitamins-Minerals (QC WOMENS DAILY MULTIVITAMIN PO) Take by mouth.     No facility-administered medications prior to visit.     PAST MEDICAL HISTORY: Past Medical History:  Diagnosis Date  . Headache(784.0)   . Memory loss     PAST SURGICAL HISTORY: Past Surgical History:  Procedure Laterality Date  . MOUTH SURGERY    . SKIN CANCER EXCISION      FAMILY HISTORY: No family history on file.  SOCIAL HISTORY: Social History   Social History  . Marital status: Married    Spouse name: Earlene Plater  . Number of children: 2  . Years of education: 12   Occupational History  . retired Unemployed   Social History Main Topics  . Smoking status: Never Smoker  . Smokeless tobacco: Never Used  . Alcohol use 0.0 oz/week     Comment: 1 glass daily  . Drug use: No  . Sexual activity: Not on file   Other Topics Concern  . Not on file   Social History Narrative   [name] and lives with  her husband.   Patient has two children.   Patient is retired.   Patient has a high school education.   Patient is right-handed.   Patient drinks 5-6 cups of coffee daily.           PHYSICAL EXAM  Vitals:   09/01/16 1040  Pulse: 66  Weight: 164 lb 12.8 oz (74.8 kg)  Height: 5\' 4"  (1.626 m)   Body mass index is 28.29 kg/m. General: The patient is awake, alert and appears not in acute distress. The patient is well groomed. Head: Normocephalic, atraumatic.  Neck is supple., no bruits   Skin: Without evidence of edema, or rash, but very dry.  Neurologic exam : The patient is awake, inappropriate affect, Giggling.  Memory impaired, she is restless & there is no measurable concentration ability. MMSE unable to cooperate  -no  speech   Cranial nerves:Pupils are equal and briskly reactive to light. Extraocular movements in vertical and horizontal planes coarse but covering all planes without nystagmus. Unable to obtain Visual fields by finger perimetry .Hearing to finger rub intact.  Facial sensation intact to fine touch. Facial motor strength is symmetric and tongue and uvula move midline.  Motor exam: Normal tone, muscle bulk and symmetric strength in all extremities. Sensory:withdraws to pain  Coordination: Unable to perform Gait and station: Patient walks without assistive device Stance is stable and normal.  Deep tendon reflexes: in the upper and lower extremities are symmetric and intact. Babinski maneuver response is downgoing. DIAGNOSTIC DATA (LABS, IMAGING, TESTING) -    Component Value Date/Time   NA 141 09/10/2015 1122   K 4.0 09/10/2015 1122   CL 107 09/10/2015 1122   CO2 29 09/10/2015 1122   GLUCOSE 94 09/10/2015 1122   BUN 15 09/10/2015 1122   CREATININE 0.75 09/10/2015 1122   CALCIUM 9.5 09/10/2015 1122   PROT 6.4 09/10/2015 1122   ALBUMIN 3.8 09/10/2015 1122   AST 23 09/10/2015 1122   ALT 16 09/10/2015 1122   ALKPHOS 99 09/10/2015  1122   BILITOT 0.4 09/10/2015 1122   GFRNONAA 103.76 08/17/2010 0825   Lab Results  Component Value Date   CHOL 206 (H) 09/10/2015   HDL 49.30 09/10/2015   LDLCALC 140 (H) 09/10/2015   LDLDIRECT 170.3 08/17/2010   TRIG 87.0 09/10/2015   CHOLHDL 4 09/10/2015      ASSESSMENT AND PLAN 66 y.o. year old female has a past medical history of severe Memory loss/ Alzheimer's dementia here to follow up. She has recently developed more agitation and having rare hallucination. She does not follow commands uncooperative with exam.The patient is a current patient of Dr. Vickey Hugerohmeier who is out of the office today . This note is sent to the work in doctor.      Continue Exelon patch will refill Continue Namenda twice daily will refill Try Depakote 125 sprinkle on food 3 times daily for agitation Discussed antipsychotic medications however due to stroke risk patient's husband is not interested Follow up in 6 months next with Dr. Vickey Hugerohmeier Additional questions answered. There also needs to be a plan B if something happens to him such as a hospitalization. I recommend that he activate his long-term care policy for his wife Create a safe environment, remove locks on bathroom  Doors Reduced confusion, keep familiar objects and people around, stick to a routine Use effective communication such as simple words and short sentences Reduce nighttime restlessness, a consistent nighttime routine,  avoid napping during the day Encourage good nutrition and hydrationVst time 25 min Nilda RiggsNancy Carolyn Martin, [facility]GNP, [facility], APRN  [facility] [address], [address] [place], [place] [id number] [phone]

## 2016-09-01 NOTE — Patient Instructions (Signed)
Continue Exelon patch will refill Continue Namenda twice daily will refill Try Depakote 125 sprinkle on food 3 times daily Follow-up in 6 months next visit with Dr. Vickey Hugerohmeier

## 2016-09-01 NOTE — Progress Notes (Signed)
I have reviewed and agreed above plan. 

## 2016-11-01 ENCOUNTER — Telehealth: Payer: Self-pay | Admitting: Family Medicine

## 2016-11-01 NOTE — Telephone Encounter (Signed)
LVM for pt to call back and schedule AWV + labs with Lesia and CPE with PCP. °

## 2016-11-23 ENCOUNTER — Telehealth: Payer: Self-pay

## 2016-11-23 NOTE — Telephone Encounter (Signed)
Noted  

## 2016-11-23 NOTE — Telephone Encounter (Signed)
Betty nurse with [facility] left v/m; last time NP was out to see pt was 04/2016 and Kathie RhodesBetty has been calling to speak with pts husband and the last time pts husband said do not think need palliative care now. Kathie RhodesBetty going to sign off on pt case and at any time if PCP wants to restart palliative care send order in.FYI to Dr Ermalene SearingBedsole.

## 2016-12-20 NOTE — Telephone Encounter (Signed)
Left pt message asking to call Allison back directly at [phone] to schedule AWV.+ labs with Lesia and CPE with PCP. °

## 2017-01-26 ENCOUNTER — Telehealth: Payer: Self-pay | Admitting: Family Medicine

## 2017-01-26 ENCOUNTER — Ambulatory Visit (INDEPENDENT_AMBULATORY_CARE_PROVIDER_SITE_OTHER): Payer: PPO | Admitting: Family Medicine

## 2017-01-26 ENCOUNTER — Encounter: Payer: Self-pay | Admitting: Family Medicine

## 2017-01-26 VITALS — BP 119/84 | HR 59 | Ht 64.0 in

## 2017-01-26 DIAGNOSIS — G300 Alzheimer's disease with early onset: Secondary | ICD-10-CM

## 2017-01-26 DIAGNOSIS — F0281 Dementia in other diseases classified elsewhere with behavioral disturbance: Secondary | ICD-10-CM

## 2017-01-26 DIAGNOSIS — Z7189 Other specified counseling: Secondary | ICD-10-CM

## 2017-01-26 DIAGNOSIS — Z111 Encounter for screening for respiratory tuberculosis: Secondary | ICD-10-CM | POA: Diagnosis not present

## 2017-01-26 NOTE — Assessment & Plan Note (Addendum)
FL2 form completed for admission to memory care. Total visit time 25 minutes, > 50% spent counseling, cordinating patients care and completion of paperwork.
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
## 2017-01-26 NOTE — Assessment & Plan Note (Signed)
Husband has been named as her guardian. Counseled husband on advance directive and info given.

## 2017-01-26 NOTE — Patient Instructions (Signed)
Continue to follow up with neurology as planned.

## 2017-01-26 NOTE — Telephone Encounter (Signed)
Pt does not NEED AMW given dementia severe

## 2017-01-27 ENCOUNTER — Ambulatory Visit: Payer: PPO | Admitting: Family Medicine

## 2017-01-30 ENCOUNTER — Telehealth: Payer: Self-pay | Admitting: *Deleted

## 2017-01-30 MED ORDER — LORAZEPAM 0.5 MG PO TABS: 0.5000 mg | ORAL_TABLET | Freq: Two times a day (BID) | ORAL | 0 refills | Status: DC | PRN

## 2017-01-30 NOTE — Telephone Encounter (Addendum)
[facility] approximately 1 hour ago.  She is very angry, hostile towards staff and belligerent.  Claris CheMargaret is asking for something to be called in asap for her agitation and anxiety.    Ph[facility] is [facility].  Patient last seen by Dr. Ermalene SearingBedsole on 01/26/17 and FL2 completed.    Current meds are: Namenda 10mg  daily  Exelon Patch.  **Our medication list states that she also has depakote for anxiety but husband has not been giving it to her at home and this was not ordered on the FL2.  Please advise.

## 2017-01-30 NOTE — Telephone Encounter (Addendum)
Dr. Patsy Lageropland gone for the day.  Discussed with Mayra ReelKate Clark, NP who orders the following:  1.  Patient to start back on the Depakote 125mg  1 capsule tid with meals.    2.  Okay order for Lorazepam 0.5mg  1 po bid PRN anxiety #10, 0 refills.  Patient's husband has the Depakote.  R/X phoned in for the Lorazepam per above orders.  Margaret [facility] notified and copy of orders faxed to the facility.

## 2017-01-30 NOTE — Telephone Encounter (Signed)
Dr. Ermalene SearingBedsole out of the office today.  Will forward to Dr. Patsy Lageropland for review.

## 2017-01-31 ENCOUNTER — Telehealth: Payer: Self-pay | Admitting: Family Medicine

## 2017-01-31 MED ORDER — LORAZEPAM 2 MG/ML PO CONC: 0.6000 mg | Freq: Two times a day (BID) | ORAL | 0 refills | Status: DC | PRN

## 2017-01-31 NOTE — Telephone Encounter (Signed)
I appreciate assistance - personal appt late afternoon. This is very appropriate.   I will forward to Dr. Leonard SchwartzB, her PCP.

## 2017-01-31 NOTE — Telephone Encounter (Signed)
Claris CheMargaret called to discuss this patient.  She did not indicate the reason for the call.  Can you please please call her at [phone]

## 2017-01-31 NOTE — Telephone Encounter (Signed)
Print[facility] out [facility]  As order.

## 2017-01-31 NOTE — Telephone Encounter (Signed)
Spoke to Toni Sullivan. Was asking if they could get an order for lorazepam gel instead of the pills. The pt is resisting taking the pill.

## 2017-02-01 ENCOUNTER — Telehealth: Payer: Self-pay | Admitting: Nurse Practitioner

## 2017-02-01 ENCOUNTER — Telehealth: Payer: Self-pay | Admitting: Internal Medicine

## 2017-02-01 NOTE — Telephone Encounter (Signed)
If she continues to have episodes she can be admitted to behavioral health at [facility].

## 2017-02-01 NOTE — Telephone Encounter (Signed)
Spoke to husband and relayed that if she had continued episodes she could be admitted to behavioral health at [facility]  I told him that they would evaluate and depending on evaluation behavioral health would be consulted.  I was not familiar with [facility].  I looked online and stated to pt that it was on [address].  He was to investigate this.
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
## 2017-02-01 NOTE — Telephone Encounter (Signed)
Spoke with Claris CheMargaret at [facility].  They are wanting a prescription for Lorazepam gel that comes in a syringe that can be placed on back of patient's wrist.  They Rx needs to be sent to patient's pharmacy for husband to pick up.  They also need a written order for the administration of the lorazepam gel faxed to [facility] at [phone].  Spoke with [facility] and they can't get that medication commercially.  Spoke to [place] at [facility] and she states they use [facility] for that medication.  Spoke with Pharmacist at [facility]. Rx needs to be written like 0.5 mg/ml (or what ever mg doctor want to prescribe) to apply 1 ml to skin with frequency doctor wishes to prescribe (q 4 hours, etc)  He states use dose  just like the oral tablets and then order the number of syringes they want to prescribe.  Fax Rx to [phone].
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
## 2017-02-03 ENCOUNTER — Inpatient Hospital Stay (HOSPITAL_COMMUNITY)
Admission: EM | Admit: 2017-02-03 | Discharge: 2017-02-16 | DRG: 071 | Disposition: A | Discharge status: Home / Self Care | Payer: PPO | Attending: Nephrology | Admitting: Nephrology

## 2017-02-03 ENCOUNTER — Encounter (HOSPITAL_COMMUNITY): Payer: Self-pay | Admitting: Emergency Medicine

## 2017-02-03 DIAGNOSIS — E876 Hypokalemia: Secondary | ICD-10-CM | POA: Diagnosis not present

## 2017-02-03 DIAGNOSIS — Z85828 Personal history of other malignant neoplasm of skin: Secondary | ICD-10-CM

## 2017-02-03 DIAGNOSIS — R4689 Other symptoms and signs involving appearance and behavior: Secondary | ICD-10-CM

## 2017-02-03 DIAGNOSIS — Z880 Allergy status to penicillin: Secondary | ICD-10-CM

## 2017-02-03 DIAGNOSIS — G9341 Metabolic encephalopathy: Secondary | ICD-10-CM | POA: Diagnosis not present

## 2017-02-03 DIAGNOSIS — M6281 Muscle weakness (generalized): Secondary | ICD-10-CM | POA: Diagnosis not present

## 2017-02-03 DIAGNOSIS — Z79899 Other long term (current) drug therapy: Secondary | ICD-10-CM

## 2017-02-03 DIAGNOSIS — R4589 Other symptoms and signs involving emotional state: Secondary | ICD-10-CM | POA: Diagnosis not present

## 2017-02-03 DIAGNOSIS — E7220 Disorder of urea cycle metabolism, unspecified: Secondary | ICD-10-CM | POA: Diagnosis not present

## 2017-02-03 DIAGNOSIS — G309 Alzheimer's disease, unspecified: Secondary | ICD-10-CM | POA: Diagnosis not present

## 2017-02-03 DIAGNOSIS — F918 Other conduct disorders: Secondary | ICD-10-CM | POA: Diagnosis not present

## 2017-02-03 DIAGNOSIS — G300 Alzheimer's disease with early onset: Secondary | ICD-10-CM | POA: Diagnosis not present

## 2017-02-03 DIAGNOSIS — N390 Urinary tract infection, site not specified: Secondary | ICD-10-CM | POA: Diagnosis not present

## 2017-02-03 DIAGNOSIS — R54 Age-related physical debility: Secondary | ICD-10-CM | POA: Diagnosis not present

## 2017-02-03 DIAGNOSIS — G9340 Encephalopathy, unspecified: Secondary | ICD-10-CM | POA: Diagnosis not present

## 2017-02-03 DIAGNOSIS — F419 Anxiety disorder, unspecified: Secondary | ICD-10-CM | POA: Diagnosis not present

## 2017-02-03 DIAGNOSIS — F0281 Dementia in other diseases classified elsewhere with behavioral disturbance: Secondary | ICD-10-CM | POA: Diagnosis present

## 2017-02-03 DIAGNOSIS — I639 Cerebral infarction, unspecified: Secondary | ICD-10-CM | POA: Diagnosis not present

## 2017-02-03 DIAGNOSIS — Z8673 Personal history of transient ischemic attack (TIA), and cerebral infarction without residual deficits: Secondary | ICD-10-CM | POA: Diagnosis not present

## 2017-02-03 LAB — URINALYSIS, ROUTINE W REFLEX MICROSCOPIC
BILIRUBIN URINE: NEGATIVE
GLUCOSE, UA: NEGATIVE mg/dL
Ketones, ur: 5 mg/dL — AB
NITRITE: NEGATIVE
PH: 5 (ref 5.0–8.0)
Protein, ur: 30 mg/dL — AB
SPECIFIC GRAVITY, URINE: 1.02 (ref 1.005–1.030)
Squamous Epithelial / LPF: NONE SEEN

## 2017-02-03 LAB — CBC WITH DIFFERENTIAL/PLATELET
Basophils Absolute: 0 10*3/uL (ref 0.0–0.1)
Basophils Relative: 0 %
EOS ABS: 0.1 10*3/uL (ref 0.0–0.7)
EOS PCT: 2 %
HCT: 38 % (ref 36.0–46.0)
Hemoglobin: 12.9 g/dL (ref 12.0–15.0)
LYMPHS PCT: 30 %
Lymphs Abs: 2.1 10*3/uL (ref 0.7–4.0)
MCH: 28.4 pg (ref 26.0–34.0)
MCHC: 33.9 g/dL (ref 30.0–36.0)
MCV: 83.5 fL (ref 78.0–100.0)
MONO ABS: 0.5 10*3/uL (ref 0.1–1.0)
MONOS PCT: 7 %
Neutro Abs: 4.2 10*3/uL (ref 1.7–7.7)
Neutrophils Relative %: 61 %
PLATELETS: 210 10*3/uL (ref 150–400)
RBC: 4.55 MIL/uL (ref 3.87–5.11)
RDW: 13.6 % (ref 11.5–15.5)
WBC: 6.9 10*3/uL (ref 4.0–10.5)

## 2017-02-03 LAB — COMPREHENSIVE METABOLIC PANEL
ALT: 13 U/L — AB (ref 14–54)
ANION GAP: 6 (ref 5–15)
AST: 21 U/L (ref 15–41)
Albumin: 3.4 g/dL — ABNORMAL LOW (ref 3.5–5.0)
Alkaline Phosphatase: 89 U/L (ref 38–126)
BUN: 18 mg/dL (ref 6–20)
CALCIUM: 8.9 mg/dL (ref 8.9–10.3)
CHLORIDE: 111 mmol/L (ref 101–111)
CO2: 26 mmol/L (ref 22–32)
Creatinine, Ser: 0.78 mg/dL (ref 0.44–1.00)
Glucose, Bld: 93 mg/dL (ref 65–99)
Potassium: 3.5 mmol/L (ref 3.5–5.1)
SODIUM: 143 mmol/L (ref 135–145)
Total Bilirubin: 0.7 mg/dL (ref 0.3–1.2)
Total Protein: 6.2 g/dL — ABNORMAL LOW (ref 6.5–8.1)

## 2017-02-03 LAB — RAPID URINE DRUG SCREEN, HOSP PERFORMED
AMPHETAMINES: NOT DETECTED
Barbiturates: NOT DETECTED
Benzodiazepines: NOT DETECTED
Cocaine: NOT DETECTED
Opiates: NOT DETECTED
TETRAHYDROCANNABINOL: NOT DETECTED

## 2017-02-03 LAB — ETHANOL: Alcohol, Ethyl (B): <5 mg/dL (ref <5)

## 2017-02-03 MED ORDER — DIVALPROEX SODIUM 125 MG PO CSDR
125.0000 mg | DELAYED_RELEASE_CAPSULE | Freq: Three times a day (TID) | ORAL | Status: DC
  Filled 2017-02-03 (×2): qty 1

## 2017-02-03 MED ORDER — DEXTROSE 5 % IV SOLN
1.0000 g | INTRAVENOUS | Status: DC
  Administered 2017-02-03 – 2017-02-06 (×4): 1 g via INTRAVENOUS
  Filled 2017-02-03 (×4): qty 10

## 2017-02-03 MED ORDER — ONDANSETRON HCL 4 MG/2ML IJ SOLN: 4.0000 mg | Freq: Four times a day (QID) | INTRAMUSCULAR | Status: DC | PRN

## 2017-02-03 MED ORDER — HALOPERIDOL LACTATE 5 MG/ML IJ SOLN
INTRAMUSCULAR | Status: AC
  Administered 2017-02-03: 2 mg via INTRAMUSCULAR
  Filled 2017-02-03: qty 1

## 2017-02-03 MED ORDER — ONDANSETRON HCL 4 MG PO TABS: 4.0000 mg | ORAL_TABLET | Freq: Four times a day (QID) | ORAL | Status: DC | PRN

## 2017-02-03 MED ORDER — RIVASTIGMINE 13.3 MG/24HR TD PT24
13.3000 mg | MEDICATED_PATCH | Freq: Every day | TRANSDERMAL | Status: DC
  Administered 2017-02-04 – 2017-02-15 (×12): 13.3 mg via TOPICAL
  Filled 2017-02-03: qty 1

## 2017-02-03 MED ORDER — DIVALPROEX SODIUM 125 MG PO CSDR: 125.0000 mg | DELAYED_RELEASE_CAPSULE | Freq: Three times a day (TID) | ORAL | Status: DC

## 2017-02-03 MED ORDER — ACETAMINOPHEN 650 MG RE SUPP: 650.0000 mg | Freq: Four times a day (QID) | RECTAL | Status: DC | PRN

## 2017-02-03 MED ORDER — SODIUM CHLORIDE 0.9% FLUSH
3.0000 mL | Freq: Two times a day (BID) | INTRAVENOUS | Status: DC
  Administered 2017-02-09: 3 mL via INTRAVENOUS

## 2017-02-03 MED ORDER — SODIUM CHLORIDE 0.9 % IV SOLN
INTRAVENOUS | Status: DC
  Administered 2017-02-03 – 2017-02-08 (×9): via INTRAVENOUS

## 2017-02-03 MED ORDER — HALOPERIDOL LACTATE 5 MG/ML IJ SOLN
2.0000 mg | Freq: Four times a day (QID) | INTRAMUSCULAR | Status: DC | PRN
  Administered 2017-02-06: 2 mg via INTRAMUSCULAR

## 2017-02-03 MED ORDER — LORAZEPAM 0.5 MG PO TABS: 0.5000 mg | ORAL_TABLET | Freq: Two times a day (BID) | ORAL | Status: DC

## 2017-02-03 MED ORDER — LORAZEPAM 2 MG/ML IJ SOLN
1.0000 mg | Freq: Four times a day (QID) | INTRAMUSCULAR | Status: DC | PRN
  Administered 2017-02-03 – 2017-02-08 (×7): 1 mg via INTRAVENOUS
  Filled 2017-02-03 (×8): qty 1

## 2017-02-03 MED ORDER — SODIUM CHLORIDE 0.9 % IV SOLN
INTRAVENOUS | Status: DC
  Administered 2017-02-03: 125 mL/h via INTRAVENOUS

## 2017-02-03 MED ORDER — DIVALPROEX SODIUM 125 MG PO CSDR
125.0000 mg | DELAYED_RELEASE_CAPSULE | Freq: Three times a day (TID) | ORAL | Status: DC
  Administered 2017-02-03 – 2017-02-06 (×8): 125 mg via ORAL
  Filled 2017-02-03 (×9): qty 1

## 2017-02-03 MED ORDER — LORAZEPAM 2 MG/ML IJ SOLN
INTRAMUSCULAR | Status: AC
  Administered 2017-02-03: 1 mg via INTRAMUSCULAR
  Filled 2017-02-03: qty 1

## 2017-02-03 MED ORDER — LORAZEPAM 2 MG/ML IJ SOLN
1.0000 mg | Freq: Once | INTRAMUSCULAR | Status: AC
  Administered 2017-02-03: 1 mg via INTRAMUSCULAR

## 2017-02-03 MED ORDER — HALOPERIDOL LACTATE 5 MG/ML IJ SOLN
2.0000 mg | Freq: Once | INTRAMUSCULAR | Status: AC
  Administered 2017-02-03: 2 mg via INTRAMUSCULAR

## 2017-02-03 MED ORDER — MEMANTINE HCL 10 MG PO TABS
10.0000 mg | ORAL_TABLET | Freq: Two times a day (BID) | ORAL | Status: DC
  Administered 2017-02-03 – 2017-02-06 (×6): 10 mg via ORAL
  Filled 2017-02-03 (×6): qty 1

## 2017-02-03 MED ORDER — ACETAMINOPHEN 325 MG PO TABS
650.0000 mg | ORAL_TABLET | Freq: Four times a day (QID) | ORAL | Status: DC | PRN
Start: 2017-02-03 — End: 2017-02-16
  Administered 2017-02-08: 650 mg via ORAL
  Filled 2017-02-03 (×2): qty 2

## 2017-02-03 NOTE — Telephone Encounter (Signed)
noted 
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
## 2017-02-03 NOTE — ED Notes (Signed)
Patient resting with eyes closed.  Unhooked all restraints from bed.  Husband at bedside.
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
## 2017-02-03 NOTE — Telephone Encounter (Signed)
Rx for Lorazepam gel faxed to [facility] at [phone]. Orders to administer the Lor[facility]epam gel [facility] at [phone].
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
## 2017-02-03 NOTE — Progress Notes (Signed)
Consult request has been received. CSW attempting to follow up at present time.  CSW reviewed nots and pt's legal guardian is the pt's husband Theodis BlazeWallace Priestly at ph: [phone].    Dorothe PeaJonathan F. Marionna Gonia, Theresia MajorsLCSWA, LCAS Clinical Social Worker Ph: [phone]

## 2017-02-03 NOTE — H&P (Signed)
History and Physical    Toni Sullivan:[id number] DOB: [birth year] DOA: 02/03/2017  Referring MD/NP/PA: Dr. Freida Busman  PCP: Excell Seltzer, MD   Outpatient Specialists: Neurology, Dr. Vickey Huger   Patient coming from: home    Chief Complaint: altered mental status  HPI: Toni Sullivan is a 66 y.o. female with medical history significant   Pt presented to ED from with worsening mental status changes. Patient husband is at the bedside reported pt was a very suddenly aggressive, confused, combative. Apparently she has as needed ativan but did not receive any recently. She was discharged recently from ALF due to very aggressive behavior. No respiratory distress. No abdominal pain, nausea or vomiting. No fevers or chills. No cough, no chest pain. No falls or lightheadedness or loss of consciousness.  ED Course: BP was 212/80 and subsequently 126/96. Blood work was relatively unremarkable. She was   Review of Systems:  Constitutional: Negative for fever, chills, diaphoresis, activity change, appetite change and fatigue.  HENT: Negative for ear pain, nosebleeds, congestion, facial swelling, rhinorrhea, neck pain, neck stiffness and ear discharge.   Eyes: Negative for pain, discharge, redness, itching and visual disturbance.  Respiratory: Negative for cough, choking, chest tightness, shortness of breath, wheezing and stridor.   Cardiovascular: Negative for chest pain, palpitations and leg swelling.  Gastrointestinal: Negative for abdominal distention.  Genitourinary: Negative for dysuria, urgency, frequency, hematuria, flank pain, decreased urine volume, difficulty urinating and dyspareunia.  Musculoskeletal: Negative for back pain, joint swelling, arthralgias and gait problem.  Neurological: per HPI Hematological: Negative for adenopathy. Does not bruise/bleed easily.  Psychiatric/Behavioral: Negative for hallucinations, behavioral problems, confusion, dysphoric mood, decreased concentration  and agitation.   Past Medical History:  Diagnosis Date  . Headache(784.0)   . Memory loss     Past Surgical History:  Procedure Laterality Date  . MOUTH SURGERY    . SKIN CANCER EXCISION      Social history:  reports that she has never smoked. She has never used smokeless tobacco. She reports that she drinks alcohol. She reports that she does not use drugs.  Ambulation: ambulates without assistance at baseline. Uses as needed cane.  Allergies  Allergen Reactions  . Penicillins     Has patient had a PCN reaction causing immediate rash, facial/tongue/throat swelling, SOB or lightheadedness with hypotension: Unknown Has patient had a PCN reaction causing severe rash involving mucus membranes or skin necrosis: Unknown Has patient had a PCN reaction that required hospitalization: Unknown Has patient had a PCN reaction occurring within the last 10 years: No If all of the above answers are "NO", then may proceed with Cephalosporin use.     Family history: mother had hypertension.   Prior to Admission medications   Medication Sig Start Date End Date Taking? Authorizing Provider  divalproex (DEPAKOTE SPRINKLE) 125 MG capsule Take 1 capsule (125 mg total) by mouth 3 (three) times daily. With meals 09/01/16  Yes Nilda Riggs, NP  LORazepam (ATIVAN) 0.5 MG tablet Take 0.5 mg by mouth 2 (two) times daily.   Yes [provider]  memantine (NAMENDA) 10 MG tablet Take 1 tablet (10 mg total) by mouth 2 (two) times daily. 09/01/16  Yes Nilda Riggs, NP  Rivastigmine (EXELON) 13.3 MG/24HR PT24 Apply 1 patch (13.3 mg total) topically daily. 09/01/16  Yes Nilda Riggs, NP  LORazepam (LORAZEPAM INTENSOL) 2 MG/ML concentrated solution Take 0.3 mLs (0.6 mg total) by mouth 2 (two) times daily as needed for anxiety. 01/31/17  Excell Seltzer, MD    Physical Exam: Vitals:   02/03/17 1416 02/03/17 1500 02/03/17 1530 02/03/17 1540  BP: (!) 109/97 109/73 110/73 110/73    Pulse: 62 (!) 56  60  Resp: 15 13  14   Temp:      TempSrc:      SpO2: 97% 99%  98%    Constitutional: combative, restless  Vitals:   02/03/17 1416 02/03/17 1500 02/03/17 1530 02/03/17 1540  BP: (!) 109/97 109/73 110/73 110/73  Pulse: 62 (!) 56  60  Resp: 15 13  14   Temp:      TempSrc:      SpO2: 97% 99%  98%   Eyes: PERRL, lids and conjunctivae normal ENMT: Mucous membranes are moist. Posterior pharynx clear of any exudate or lesions. Neck: normal, supple, no masses, no thyromegaly Respiratory: clear to auscultation bilaterally, no wheezing, no crackles. Normal respiratory effort. No accessory muscle use.  Cardiovascular: Regular rate and rhythm, no murmurs / rubs / gallops. No extremity edema. 2+ pedal pulses. No carotid bruits.  Abdomen: no tenderness, no masses palpated. No hepatosplenomegaly. Bowel sounds positive.  Musculoskeletal: no clubbing / cyanosis. No joint deformity upper and lower extremities. Good ROM, no contractures. Normal muscle tone.  Skin: no rashes, lesions, ulcers. No induration Neurologic: CN 2-12 grossly intact. Sensation intact, DTR normal. Strength 5/5 in all 4.  Psychiatric: confused, disoriented, restless    Labs on Admission: I have personally reviewed following labs and imaging studies  CBC:  Recent Labs Lab 02/03/17 1357  WBC 6.9  NEUTROABS 4.2  HGB 12.9  HCT 38.0  MCV 83.5  PLT 210   Basic Metabolic Panel:  Recent Labs Lab 02/03/17 1357  NA 143  K 3.5  CL 111  CO2 26  GLUCOSE 93  BUN 18  CREATININE 0.78  CALCIUM 8.9   GFR: CrCl cannot be calculated (Unknown ideal weight.). Liver Function Tests:  Recent Labs Lab 02/03/17 1357  AST 21  ALT 13*  ALKPHOS 89  BILITOT 0.7  PROT 6.2*  ALBUMIN 3.4*   No results for input(s): LIPASE, AMYLASE in the last 168 hours. No results for input(s): AMMONIA in the last 168 hours. Coagulation Profile: No results for input(s): INR, PROTIME in the last 168 hours. Cardiac  Enzymes: No results for input(s): CKTOTAL, CKMB, CKMBINDEX, TROPONINI in the last 168 hours. BNP (last 3 results) No results for input(s): PROBNP in the last 8760 hours. HbA1C: No results for input(s): HGBA1C in the last 72 hours. CBG: No results for input(s): GLUCAP in the last 168 hours. Lipid Profile: No results for input(s): CHOL, HDL, LDLCALC, TRIG, CHOLHDL, LDLDIRECT in the last 72 hours. Thyroid Function Tests: No results for input(s): TSH, T4TOTAL, FREET4, T3FREE, THYROIDAB in the last 72 hours. Anemia Panel: No results for input(s): VITAMINB12, FOLATE, FERRITIN, TIBC, IRON, RETICCTPCT in the last 72 hours. Urine analysis:    Component Value Date/Time   COLORURINE YELLOW 02/03/2017 1357   APPEARANCEUR HAZY (A) 02/03/2017 1357   LABSPEC 1.020 02/03/2017 1357   PHURINE 5.0 02/03/2017 1357   GLUCOSEU NEGATIVE 02/03/2017 1357   HGBUR MODERATE (A) 02/03/2017 1357   BILIRUBINUR NEGATIVE 02/03/2017 1357   KETONESUR 5 (A) 02/03/2017 1357   PROTEINUR 30 (A) 02/03/2017 1357   NITRITE NEGATIVE 02/03/2017 1357   LEUKOCYTESUR LARGE (A) 02/03/2017 1357   Sepsis Labs: @LABRCNTIP (procalcitonin:4,lacticidven:4) )No results found for this or any previous visit (from the past 240 hour(s)).   Radiological Exams on Admission: No results found.  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 - Likely due to UTI and dementia - Use restraints if need if ativan and haldol PRN don't help - PT eval pending   Active Problems:   Acute lower UTI - UA with large leukocytes - Follow up urine cx results - Continue rocephin   DVT prophylaxis: SCD's Code Status: full code Family Communication: husband at bedside Disposition Plan: admission to telemetry due ot hypertension Consults called: none  Admission status: inpatient, requires IV rocephin for uti and follow up of  urine cx results    Manson PasseyAlma Aubryn Spinola MD [facility] Pager [phone]  If 7PM-7AM, please contact night-coverage [email or web address] Password [email or web address]  02/03/2017, 4:09 PM

## 2017-02-03 NOTE — ED Provider Notes (Signed)
[facility]-EMERGENCY DEPT Provider Note   CSN: [id number] Arrival date & time: 02/03/17  1229     History   Chief Complaint Chief Complaint  Patient presents with  . Aggressive Behavior    HPI Toni Sullivan is a 66 y.o. female.  66 year old female with history of dementia according to the husband who presents here with increased agitation and aggression times several days. Patient had been in a facility this week but because of her aggression had to be discharged. Husband states that she has been attacking him and other people. States that she has been prescribed when necessary Ativan but has not received any recently. Has been prescribed other medications but is currently not taking.      Past Medical History:  Diagnosis Date  . Headache(784.0)   . Memory loss     Patient Active Problem List   Diagnosis Date Noted  . 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 Counseling regarding end of life decision making 01/26/2017  . History of CVA (cerebrovascular accident) 07/03/2013  . Osteopenia 09/11/2012  . TRANSIENT ISCHEMIC ATTACKS, HX OF 04/22/2009  . HYPERLIPIDEMIA 01/15/2009  . NEOPLASM, MALIGNANT, CARCINOMA, BASAL CELL, NOSE 01/15/2009    Past Surgical History:  Procedure Laterality Date  . MOUTH SURGERY    . SKIN CANCER EXCISION      OB History    No data available       Home Medications    Prior to Admission medications   Medication Sig Start Date End Date Taking? Authorizing Provider  divalproex (DEPAKOTE SPRINKLE) 125 MG capsule Take 1 capsule (125 mg total) by mouth 3 (three) times daily. With meals 09/01/16   Nilda RiggsMartin, Nancy Carolyn, NP  LORazepam (LORAZEPAM INTENSOL) 2 MG/ML concentrated solution Take 0.3 mLs (0.6 mg total) by mouth 2 (two) times daily as needed for anxiety. 01/31/17   Bedsole, Amy E, MD  memantine (NAMENDA) 10 MG tablet Take 1 tablet (10 mg total) by mouth 2 (two) times daily. 09/01/16   Nilda RiggsMartin,  Nancy Carolyn, NP  Rivastigmine (EXELON) 13.3 MG/24HR PT24 Apply 1 patch (13.3 mg total) topically daily. 09/01/16   Nilda RiggsMartin, Nancy Carolyn, NP    Family History No family history on file.  Social History Social History  Substance Use Topics  . Smoking status: Never Smoker  . Smokeless tobacco: Never Used  . Alcohol use 0.0 oz/week     Comment: 1 glass daily     Allergies   Penicillins   Review of Systems Review of Systems  Unable to perform ROS: Psychiatric disorder     Physical Exam Updated Vital Signs There were no vitals taken for this visit.  Physical Exam  Constitutional: She is oriented to person, place, and time. She appears well-developed and well-nourished.  Non-toxic appearance. No distress.  HENT:  Head: Normocephalic and atraumatic.  Eyes: Conjunctivae, EOM and lids are normal. Pupils are equal, round, and reactive to light.  Neck: Normal range of motion. Neck supple. No tracheal deviation present. No thyroid mass present.  Cardiovascular: Normal rate, regular rhythm and normal heart sounds.  Exam reveals no gallop.   No murmur heard. Pulmonary/Chest: Effort normal and breath sounds normal. No stridor. No respiratory distress. She has no decreased breath sounds. She has no wheezes. She has no rhonchi. She has no rales.  Abdominal: Soft. Normal appearance and bowel sounds are normal. She exhibits no distension. There is no tenderness. There is no rebound and no CVA tenderness.  Musculoskeletal:  Normal range of motion. She exhibits no edema or tenderness.  Neurological: She is alert and oriented to person, place, and time. She has normal strength. No cranial nerve deficit or sensory deficit. GCS eye subscore is 4. GCS verbal subscore is 5. GCS motor subscore is 6.  Skin: Skin is warm and dry. No abrasion and no rash noted.  Psychiatric: Her affect is labile and inappropriate. Her speech is rapid and/or pressured. She is agitated and aggressive.  Nursing note and  vitals reviewed.    ED Treatments / Results  Labs (all labs ordered are listed, but only abnormal results are displayed) Labs Reviewed  ETHANOL  RAPID URINE DRUG SCREEN, HOSP PERFORMED  URINALYSIS, ROUTINE W REFLEX MICROSCOPIC  CBC WITH DIFFERENTIAL/PLATELET  COMPREHENSIVE METABOLIC PANEL    EKG  EKG Interpretation None       Radiology No results found.  Procedures Procedures (including critical care time)  Medications Ordered in ED Medications  haloperidol lactate (HALDOL) injection 2 mg (2 mg Intramuscular Given 02/03/17 1254)  LORazepam (ATIVAN) injection 1 mg (1 mg Intramuscular Given 02/03/17 1255)     Initial Impression / Assessment and Plan / ED Course  I have reviewed the triage vital signs and the nursing notes.  Pertinent labs & imaging results that were available during my care of the patient were reviewed by me and considered in my medical decision making (see chart for details).     Patient aggressive and combative here. Required sedation with Haldol and Ativan. Reassessment will times in is not much more cooperative. Has evidence of UTI on urinalysis and started on Rocephin. Will be admitted to the hospitalist service  CRITICAL CARE Performed by: Toy Baker Total critical care time: 45 minutes Critical care time was exclusive of separately billable procedures and treating other patients. Critical care was necessary to treat or prevent imminent or life-threatening deterioration. Critical care was time spent personally by me on the following activities: development of treatment plan with patient and/or surrogate as well as nursing, discussions with consultants, evaluation of patient's response to treatment, examination of patient, obtaining history from patient or surrogate, ordering and performing treatments and interventions, ordering and review of laboratory studies, ordering and review of radiographic studies, pulse oximetry and re-evaluation of  patient's condition.   Final Clinical Impressions(s) / ED Diagnoses   Final diagnoses:  None    New Prescriptions New Prescriptions   No medications on file     Lorre Nick, MD 02/03/17 1530

## 2017-02-03 NOTE — ED Notes (Signed)
Dr Elisabeth Pigeonevine hospitalist at bedside

## 2017-02-03 NOTE — Telephone Encounter (Signed)
Prescription in donna's box.   [facility] and if there is a MD that sees patient's inhouse.. As this may be most appropriate for pt.

## 2017-02-03 NOTE — Telephone Encounter (Signed)
[facility] [place] [facility]  She states that Mrs. Fariss was acting out so bad that her husband came yesterday and picked her up and took her home.  [facility] but if [facility], they have a doctor that does house calls but not on staff.

## 2017-02-03 NOTE — ED Notes (Signed)
Patient yelling, screaming and running through through hallway of ED.   Verbal order for restraints given from Dr Freida BusmanAllen.

## 2017-02-04 LAB — CBC
HEMATOCRIT: 38.3 % (ref 36.0–46.0)
Hemoglobin: 13.1 g/dL (ref 12.0–15.0)
MCH: 28.5 pg (ref 26.0–34.0)
MCHC: 34.2 g/dL (ref 30.0–36.0)
MCV: 83.3 fL (ref 78.0–100.0)
PLATELETS: 216 10*3/uL (ref 150–400)
RBC: 4.6 MIL/uL (ref 3.87–5.11)
RDW: 13.3 % (ref 11.5–15.5)
WBC: 8 10*3/uL (ref 4.0–10.5)

## 2017-02-04 LAB — GLUCOSE, CAPILLARY: Glucose-Capillary: 146 mg/dL — ABNORMAL HIGH (ref 65–99)

## 2017-02-04 LAB — BASIC METABOLIC PANEL
Anion gap: 8 (ref 5–15)
BUN: 10 mg/dL (ref 6–20)
CHLORIDE: 108 mmol/L (ref 101–111)
CO2: 25 mmol/L (ref 22–32)
Calcium: 8.6 mg/dL — ABNORMAL LOW (ref 8.9–10.3)
Creatinine, Ser: 0.55 mg/dL (ref 0.44–1.00)
GFR calc Af Amer: >60 mL/min (ref >60)
GFR calc non Af Amer: >60 mL/min (ref >60)
GLUCOSE: 104 mg/dL — AB (ref 65–99)
POTASSIUM: 3.2 mmol/L — AB (ref 3.5–5.1)
Sodium: 141 mmol/L (ref 135–145)

## 2017-02-04 MED ORDER — POTASSIUM CHLORIDE CRYS ER 20 MEQ PO TBCR
40.0000 meq | EXTENDED_RELEASE_TABLET | Freq: Two times a day (BID) | ORAL | Status: AC
  Administered 2017-02-04 (×2): 40 meq via ORAL
  Filled 2017-02-04 (×2): qty 2

## 2017-02-04 NOTE — Progress Notes (Signed)
PROGRESS NOTE    Toni Sullivan  UJW:[id number] DOB: 09/12/1950 DOA: 02/03/2017 PCP: Excell SeltzerBedsole, Amy E, MD    Brief Narrative:  66 y.o. female who presented to ED from with worsening mental status changes. Patient husband is at the bedside reported pt was a very suddenly aggressive, confused, combative. Apparently she has as needed ativan but did not receive any recently. She was discharged recently from ALF due to very aggressive behavior. No respiratory distress. No abdominal pain, nausea or vomiting. No fevers or chills. No cough, no chest pain. No falls or lightheadedness or loss of consciousness.  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 - Likely due to UTI and dementia - continue with restraints if need if ativan and haldol PRN - PT consulted - Continue with empiric rocephin per below  Active Problems:   Acute lower UTI - UA with large leukocytes - Follow up urine cx results - Continue rocephin as tolerated  Hypokalemia - replaced - repeat bmet in AM  DVT prophylaxis: SCD's Code Status: Full Family Communication: Family at bedside Disposition Plan: Uncertain at this time  Consultants:     Procedures:     Antimicrobials: Anti-infectives    Start     Dose/Rate Route Frequency Ordered Stop   02/03/17 1500  cefTRIAXone (ROCEPHIN) 1 g in dextrose 5 % 50 mL IVPB     1 g 100 mL/hr over 30 Minutes Intravenous Every 24 hours 02/03/17 1445         Subjective: Sedated, cannot obtain  Objective: Vitals:   02/03/17 1744 02/03/17 2148 02/04/17 0409 02/04/17 0700  BP: (!) 126/96 (!) 122/91  114/78  Pulse: 82 79  62  Resp: 18 18  18   Temp: 98.1 F (36.7 C) 98.7 F (37.1 C)  98 F (36.7  C)  TempSrc: Axillary Axillary  Axillary  SpO2:  98%  99%  Weight: 73.8 kg (162 lb 11.2 oz)  70.4 kg (155 lb 3.3 oz)   Height: 5\' 4"  (1.626 m)       Intake/Output Summary (Last 24 hours) at 02/04/17 1643 Last data filed at 02/04/17 1522  Gross per 24 hour  Intake          2680.42 ml  Output                0 ml  Net          2680.42 ml   Filed Weights   02/03/17 1744 02/04/17 0409  Weight: 73.8 kg (162 lb 11.2 oz) 70.4 kg (155 lb 3.3 oz)    Examination:  General exam: Appears calm and comfortable  Respiratory system: Clear to auscultation. Respiratory effort normal. Cardiovascular system: S1 & S2 heard, RRR.  Gastrointestinal system: Abdomen is nondistended, soft and nontender. No organomegaly or masses felt. Normal bowel sounds heard. Central nervous system: sedated. No focal neurological deficits. No tremors Extremities: Symmetric 5 x 5 power. Skin: No rashes, lesions Psychiatry: cannot obtain, currently sedated  Data Reviewed: I have personally reviewed following labs and imaging studies  CBC:  Recent Labs Lab 02/03/17 1357 02/04/17 0640  WBC 6.9 8.0  NEUTROABS 4.2  --   HGB 12.9 13.1  HCT 38.0 38.3  MCV 83.5 83.3  PLT 210 216  Basic Metabolic Panel:  Recent Labs Lab 02/03/17 1357 02/04/17 0640  NA 143 141  K 3.5 3.2*  CL 111 108  CO2 26 25  GLUCOSE 93 104*  BUN 18 10  CREATININE 0.78 0.55  CALCIUM 8.9 8.6*   GFR: Estimated Creatinine Clearance: 64.8 mL/min (by C-G formula based on SCr of 0.55 mg/dL). Liver Function Tests:  Recent Labs Lab 02/03/17 1357  AST 21  ALT 13*  ALKPHOS 89  BILITOT 0.7  PROT 6.2*  ALBUMIN 3.4*   No results for input(s): LIPASE, AMYLASE in the last 168 hours. No results for input(s): AMMONIA in the last 168 hours. Coagulation Profile: No results for input(s): INR, PROTIME in the last 168 hours. Cardiac Enzymes: No results for input(s): CKTOTAL, CKMB, CKMBINDEX, TROPONINI in the last 168 hours. BNP (last 3  results) No results for input(s): PROBNP in the last 8760 hours. HbA1C: No results for input(s): HGBA1C in the last 72 hours. CBG:  Recent Labs Lab 02/04/17 0851  GLUCAP 146*   Lipid Profile: No results for input(s): CHOL, HDL, LDLCALC, TRIG, CHOLHDL, LDLDIRECT in the last 72 hours. Thyroid Function Tests: No results for input(s): TSH, T4TOTAL, FREET4, T3FREE, THYROIDAB in the last 72 hours. Anemia Panel: No results for input(s): VITAMINB12, FOLATE, FERRITIN, TIBC, IRON, RETICCTPCT in the last 72 hours. Sepsis Labs: No results for input(s): PROCALCITON, LATICACIDVEN in the last 168 hours.  No results found for this or any previous visit (from the past 240 hour(s)).   Radiology Studies: No results found.  Scheduled Meds: . divalproex  125 mg Oral TID WC  . memantine  10 mg Oral BID  . potassium chloride  40 mEq Oral BID  . Rivastigmine  13.3 mg Topical Daily  . sodium chloride flush  3 mL Intravenous Q12H   Continuous Infusions: . sodium chloride Stopped (02/03/17 1900)  . sodium chloride 75 mL/hr at 02/04/17 1522  . cefTRIAXone (ROCEPHIN)  IV Stopped (02/04/17 1551)     LOS: 1 day   Destinae Neubecker, Scheryl Marten, MD [facility] Pager [phone]  If 7PM-7AM, please contact night-coverage [email or web address] Password [facility] 02/04/2017, 4:43 PM

## 2017-02-04 NOTE — Progress Notes (Signed)
PT Cancellation Note  Patient Details Name: Toni Sullivan MRN: [id number] DOB: 03/09/1951   Cancelled Treatment:    Reason Eval/Treat Not Completed: Other (comment) (pt aggressive/combative and in restraints. RN requested no PT this morning as pt is unsafe when unrestrained. Will attempt again tomorrow. )   [name], [name] 02/04/2017, 8:01 AM [phone]

## 2017-02-05 LAB — BASIC METABOLIC PANEL
Anion gap: 7 (ref 5–15)
BUN: 8 mg/dL (ref 6–20)
CHLORIDE: 110 mmol/L (ref 101–111)
CO2: 26 mmol/L (ref 22–32)
Calcium: 9.1 mg/dL (ref 8.9–10.3)
Creatinine, Ser: 0.65 mg/dL (ref 0.44–1.00)
GFR calc Af Amer: >60 mL/min (ref >60)
GFR calc non Af Amer: >60 mL/min (ref >60)
GLUCOSE: 85 mg/dL (ref 65–99)
POTASSIUM: 4.1 mmol/L (ref 3.5–5.1)
SODIUM: 143 mmol/L (ref 135–145)

## 2017-02-05 LAB — URINE CULTURE

## 2017-02-05 LAB — GLUCOSE, CAPILLARY: GLUCOSE-CAPILLARY: 78 mg/dL (ref 65–99)

## 2017-02-05 NOTE — Social Work (Signed)
Passr obtained; FL2 pending as no PT evaluation completed yet.   CSW met with spouse who advised that patient resided at [facility] and thinks patient will be accepted back.  CSW called [facility] [phone] and was advised that admission staff, Butch Penny, will be in office on Monday.   CSW discussed with spouse other options for SNF ([facility]) if [facility] does not accept patient back.    In review of chart appears  IVC completed on 02/03/17.

## 2017-02-05 NOTE — Progress Notes (Signed)
PT Cancellation Note  Patient Details Name: Toni Sullivan MRN: [id number] DOB: 11/04/1950   Cancelled Treatment:    Reason Eval/Treat Not Completed: Patient's level of consciousness-in restraints  DarlingtonKaren Nichola Warren PT [phone]  Rada HayHill, Mykeal Carrick Elizabeth 02/05/2017, 12:39 PM

## 2017-02-05 NOTE — Progress Notes (Signed)
PROGRESS NOTE    Toni Sullivan  ZOX:[id number] DOB: Jan 26, 1951 DOA: 02/03/2017 PCP: Excell Seltzer, MD    Brief Narrative:  66 y.o. female who presented to ED from with worsening mental status changes. Patient husband is at the bedside reported pt was a very suddenly aggressive, confused, combative. Apparently she has as needed ativan but did not receive any recently. She was discharged recently from ALF due to very aggressive behavior. No respiratory distress. No abdominal pain, nausea or vomiting. No fevers or chills. No cough, no chest pain. No falls or lightheadedness or loss of consciousness.  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 Multiple species present, recommendations for re-collection. Will reorder urine culture -Patient remains encephalopathic. Continue with anxiolytics and restraints as needed for safety  Active Problems:   Acute lower UTI - UA with large leukocytes - Per above, will recheck urine culture - Continue rocephin as tolerated  Hypokalemia - replaced -Currently within normal limits -Repeat basic metabolic panel in the morning  DVT prophylaxis: SCD's Code Status: Full Family Communication: Family at bedside Disposition Plan: Uncertain at this time  Consultants:     Procedures:     Antimicrobials: Anti-infectives    Start     Dose/Rate Route Frequency Ordered Stop   02/03/17 1500  cefTRIAXone (ROCEPHIN) 1 g in dextrose 5 % 50 mL IVPB     1 g 100 mL/hr over 30 Minutes  Intravenous Every 24 hours 02/03/17 1445        Subjective: Unable to obtain, patient sedated  Objective: Vitals:   02/04/17 0700 02/04/17 1645 02/04/17 2102 02/05/17 0555  BP: 114/78 130/87 134/88 (!) 144/88  Pulse: 62 79 71 60  Resp: 18 18 18 16   Temp: 98 F (36.7 C)  98.8 F (37.1 C) 98 F (36.7 C)  TempSrc: Axillary  Axillary Axillary  SpO2: 99%  97% 100%  Weight:    68.8 kg (151 lb 10.8 oz)  Height:        Intake/Output Summary (Last 24 hours) at 02/05/17 1501 Last data filed at 02/05/17 1100  Gross per 24 hour  Intake            437.5 ml  Output             1600 ml  Net          -1162.5 ml   Filed Weights   02/03/17 1744 02/04/17 0409 02/05/17 0555  Weight: 73.8 kg (162 lb 11.2 oz) 70.4 kg (155 lb 3.3 oz) 68.8 kg (151 lb 10.8 oz)    Examination: General exam: Sedated, laying in bed, in nad Respiratory system: Normal respiratory effort, no wheezing Cardiovascular system: regular rate, s1, s2 Gastrointestinal system: Soft, nondistended, positive BS Central nervous system: CN2-12 grossly intact, strength intact Extremities: Perfused, no clubbing Skin: Normal skin turgor, no notable skin lesions seen Psychiatry: Unable to fully assess, patient sedated   Data Reviewed: I have personally reviewed following labs and imaging studies  CBC:  Recent Labs Lab 02/03/17 1357 02/04/17 0640  WBC 6.9 8.0  NEUTROABS 4.2  --   HGB 12.9 13.1  HCT 38.0 38.3  MCV 83.5 83.3  PLT 210 216   Basic Metabolic Panel:  Recent Labs Lab 02/03/17 1357 02/04/17 0640 02/05/17 0535  NA 143 141 143  K 3.5 3.2* 4.1  CL 111 108 110  CO2 26 25 26   GLUCOSE 93 104* 85  BUN 18 10 8   CREATININE 0.78 0.55 0.65  CALCIUM 8.9 8.6* 9.1   GFR: Estimated Creatinine Clearance: 64.1 mL/min (by C-G formula based on SCr of 0.65 mg/dL). Liver Function Tests:  Recent Labs Lab 02/03/17 1357  AST 21  ALT 13*  ALKPHOS 89  BILITOT 0.7  PROT 6.2*  ALBUMIN 3.4*   No results for  input(s): LIPASE, AMYLASE in the last 168 hours. No results for input(s): AMMONIA in the last 168 hours. Coagulation Profile: No results for input(s): INR, PROTIME in the last 168 hours. Cardiac Enzymes: No results for input(s): CKTOTAL, CKMB, CKMBINDEX, TROPONINI in the last 168 hours. BNP (last 3 results) No results for input(s): PROBNP in the last 8760 hours. HbA1C: No results for input(s): HGBA1C in the last 72 hours. CBG:  Recent Labs Lab 02/04/17 0851 02/05/17 0728  GLUCAP 146* 78   Lipid Profile: No results for input(s): CHOL, HDL, LDLCALC, TRIG, CHOLHDL, LDLDIRECT in the last 72 hours. Thyroid Function Tests: No results for input(s): TSH, T4TOTAL, FREET4, T3FREE, THYROIDAB in the last 72 hours. Anemia Panel: No results for input(s): VITAMINB12, FOLATE, FERRITIN, TIBC, IRON, RETICCTPCT in the last 72 hours. Sepsis Labs: No results for input(s): PROCALCITON, LATICACIDVEN in the last 168 hours.  Recent Results (from the past 240 hour(s))  Culture, Urine     Status: Abnormal   Collection Time: 02/03/17  1:57 PM  Result Value Ref Range Status   Specimen Description URINE, RANDOM  Final   Special Requests NONE  Final   Culture MULTIPLE SPECIES PRESENT, SUGGEST RECOLLECTION (A)  Final   Report Status 02/05/2017 FINAL  Final     Radiology Studies: No results found.  Scheduled Meds: . divalproex  125 mg Oral TID WC  . memantine  10 mg Oral BID  . Rivastigmine  13.3 mg Topical Daily  . sodium chloride flush  3 mL Intravenous Q12H   Continuous Infusions: . sodium chloride 75 mL/hr at 02/05/17 0344  . cefTRIAXone (ROCEPHIN)  IV 1 g (02/05/17 1451)     LOS: 2 days   CHIU, Scheryl MartenSTEPHEN K, MD [facility] Pager [phone]  If 7PM-7AM, please contact night-coverage [email or web address] Password [email or web address] 02/05/2017, 3:01 PM

## 2017-02-06 DIAGNOSIS — F0281 Dementia in other diseases classified elsewhere with behavioral disturbance: Secondary | ICD-10-CM

## 2017-02-06 DIAGNOSIS — G9341 Metabolic encephalopathy: Principal | ICD-10-CM

## 2017-02-06 DIAGNOSIS — R4689 Other symptoms and signs involving appearance and behavior: Secondary | ICD-10-CM

## 2017-02-06 DIAGNOSIS — G300 Alzheimer's disease with early onset: Secondary | ICD-10-CM

## 2017-02-06 DIAGNOSIS — N390 Urinary tract infection, site not specified: Secondary | ICD-10-CM

## 2017-02-06 DIAGNOSIS — R4589 Other symptoms and signs involving emotional state: Secondary | ICD-10-CM

## 2017-02-06 LAB — BASIC METABOLIC PANEL
ANION GAP: 7 (ref 5–15)
BUN: 8 mg/dL (ref 6–20)
CO2: 27 mmol/L (ref 22–32)
Calcium: 9.2 mg/dL (ref 8.9–10.3)
Chloride: 108 mmol/L (ref 101–111)
Creatinine, Ser: 0.65 mg/dL (ref 0.44–1.00)
Glucose, Bld: 90 mg/dL (ref 65–99)
POTASSIUM: 4.3 mmol/L (ref 3.5–5.1)
SODIUM: 142 mmol/L (ref 135–145)

## 2017-02-06 LAB — AMMONIA: AMMONIA: 75 umol/L — AB (ref 9–35)

## 2017-02-06 LAB — TSH: TSH: 2.282 u[IU]/mL (ref 0.350–4.500)

## 2017-02-06 LAB — GLUCOSE, CAPILLARY: Glucose-Capillary: 110 mg/dL — ABNORMAL HIGH (ref 65–99)

## 2017-02-06 LAB — T4, FREE: FREE T4: 1.25 ng/dL — AB (ref 0.61–1.12)

## 2017-02-06 LAB — VITAMIN B12: VITAMIN B 12: 571 pg/mL (ref 180–914)

## 2017-02-06 MED ORDER — DIVALPROEX SODIUM 125 MG PO CSDR
250.0000 mg | DELAYED_RELEASE_CAPSULE | Freq: Three times a day (TID) | ORAL | Status: DC
  Administered 2017-02-06 – 2017-02-16 (×27): 250 mg via ORAL
  Filled 2017-02-06 (×32): qty 2

## 2017-02-06 MED ORDER — QUETIAPINE FUMARATE 25 MG PO TABS
25.0000 mg | ORAL_TABLET | Freq: Every day | ORAL | Status: DC
  Administered 2017-02-06: 25 mg via ORAL
  Filled 2017-02-06 (×2): qty 1

## 2017-02-06 MED ORDER — QUETIAPINE FUMARATE 25 MG PO TABS: 25.0000 mg | ORAL_TABLET | Freq: Two times a day (BID) | ORAL | Status: DC

## 2017-02-06 NOTE — Progress Notes (Addendum)
Full assessment completed on 6/8. CSW and psychiatrist with patient and spouse-legal guardian at bedside. Patient in 5 point restraints.  Patient has dementia and unable to provide information. Patient spouse reports he has been patient caretaker for the past seven years and recently was able to transition patient [facility]. He reports the patient was agitated and physically agressive to staff and was told patient may not be able to return to facility. Facility representative plans to visit patient, Unit CSW assisting with process. Patient spouse reports he is agreeable for patient to receive medication to help with her behaviors.    Plan: to follow and assist with psychiatrist recommendations  ALF placement vs. Geriatric Psychiatric   Vivi BarrackNicole Chancy Smigiel, Theresia MajorsLCSWA, MSW Clinical Social Worker 5E and Psychiatric Service Line [phone] 02/06/2017  3:20 PM

## 2017-02-06 NOTE — Evaluation (Signed)
Physical Therapy Evaluation Patient Details Name: Toni Sullivan MRN: [id number] DOB: 07/08/1951 Today's Date: 02/06/2017   History of Present Illness  66 yo female admitted with UTI, acute metabolic encephalopathy, agitation, aggression. Hx of Alz, CVA, osteopenia.   Clinical Impression  On eval, pt required Min assist for mobility. She walked ~125 feet with 1 HHA. Pt is unsteady-LOB intermittently during session requiring external assist to correct/prevent fall. Husband was present during session and assisted with getting pt to participate. No agitation or aggression occurred during the session. Will follow and progress activity as able.     Follow Up Recommendations Home health PT;Supervision/Assistance - 24 hour (at ALF, if possible)    Equipment Recommendations  None recommended by PT    Recommendations for Other Services       Precautions / Restrictions Precautions Precautions: Fall Restrictions Weight Bearing Restrictions: No      Mobility  Bed Mobility Overal bed mobility: Needs Assistance Bed Mobility: Supine to Sit     Supine to sit: Min assist;HOB elevated     General bed mobility comments: Asssit to get to EOB. Mostly, to encourage pt's participation.   Transfers Overall transfer level: Needs assistance   Transfers: Sit to/from Stand Sit to Stand: Min assist         General transfer comment: Assist to rise, stabilize, control descent. Unsteady.   Ambulation/Gait Ambulation/Gait assistance: Min assist Ambulation Distance (Feet): 125 Feet Assistive device: 1 person hand held assist Gait Pattern/deviations: Step-through pattern;Decreased stride length;Staggering left;Staggering right;Narrow base of support;Drifts right/left     General Gait Details: Assist to stabilize throughout ambulation distance. LOB intermittently, especially with head turns. Pt tolerated distance well.   Stairs            Wheelchair Mobility    Modified Rankin (Stroke  Patients Only)       Balance Overall balance assessment: Needs assistance           Standing balance-Leahy Scale: Poor                               Pertinent Vitals/Pain Pain Assessment: No/denies pain    Home Living Family/patient expects to be discharged to:: Unsure Living Arrangements: Spouse/significant other             Home Equipment: None      Prior Function Level of Independence: Needs assistance   Gait / Transfers Assistance Needed: ambulatory without a device.   ADL's / Homemaking Assistance Needed: assist needed for bathing, dressing - when pt would allow husband to help her        Hand Dominance        Extremity/Trunk Assessment   Upper Extremity Assessment Upper Extremity Assessment: Generalized weakness    Lower Extremity Assessment Lower Extremity Assessment: Generalized weakness    Cervical / Trunk Assessment Cervical / Trunk Assessment: Normal  Communication   Communication: No difficulties  Cognition Arousal/Alertness: Awake/alert Behavior During Therapy: WFL for tasks assessed/performed Overall Cognitive Status: History of cognitive impairments - at baseline                                        General Comments      Exercises     Assessment/Plan    PT Assessment Patient needs continued PT services  PT Problem List Decreased strength;Decreased mobility;Decreased activity tolerance;Decreased balance;Decreased  knowledge of use of DME;Decreased cognition       PT Treatment Interventions DME instruction;Therapeutic activities;Gait training;Therapeutic exercise;Patient/family education;Balance training;Functional mobility training    PT Goals (Current goals can be found in the Care Plan section)  Acute Rehab PT Goals Patient Stated Goal: per husband, goal if for pt to return to ALF PT Goal Formulation: With family Time For Goal Achievement: 02/20/17 Potential to Achieve Goals: Fair     Frequency Min 3X/week   Barriers to discharge        Co-evaluation               AM-PAC PT "6 Clicks" Daily Activity  Outcome Measure Difficulty turning over in bed (including adjusting bedclothes, sheets and blankets)?: A Little Difficulty moving from lying on back to sitting on the side of the bed? : A Little Difficulty sitting down on and standing up from a chair with arms (e.g., wheelchair, bedside commode, etc,.)?: A Little Help needed moving to and from a bed to chair (including a wheelchair)?: A Little Help needed walking in hospital room?: A Little Help needed climbing 3-5 steps with a railing? : A Little 6 Click Score: 18    End of Session Equipment Utilized During Treatment: Gait belt Activity Tolerance: Patient tolerated treatment well Patient left: in bed;with call bell/phone within reach;with family/visitor present;with bed alarm set   PT Visit Diagnosis: Muscle weakness (generalized) (M62.81);Difficulty in walking, not elsewhere classified (R26.2)    Time: 1610-9604 PT Time Calculation (min) (ACUTE ONLY): 19 min   Charges:   PT Evaluation $PT Eval Moderate Complexity: 1 Procedure     PT G Codes:          Rebeca Alert, MPT Pager: [phone]

## 2017-02-06 NOTE — Progress Notes (Signed)
PROGRESS NOTE  Toni Sullivan [id number]:[id number] DOB: 11/15/1950 DOA: 02/03/2017 PCP: Excell SeltzerBedsole, Amy E, MD  Brief History:  66 y.o.femalewho presented to ED from with worsening mental status changes. Patient husband is at the bedside reported pt has developed more aggressive, confused, combative behavior x 2-3 weeks. Apparently she has as needed ativan but did not receive any recently. She was discharged recently from ALF due to very aggressive behavior. No respiratory distress. No abdominal pain, nausea or vomiting. No fevers or chills. No cough, no chest pain. No falls or lightheadedness or loss of consciousness.  Patient husband is at bedside reported that she has been diagnosed with dementia 2011 and at the same time she had a stroke and followed up by a neurologist who has been prescribing.  Patient was recently started Depakote 125 mg 3 times daily for controlling it agitation which was not helpful. Patient has been reported patient cannot care for herself   Assessment/Plan: Acute metabolic encephalopathy /Dementia in Alzheimer's disease with behavioral disturbance - Likely due to UTI and dementia progresssion -d/c memantine as this can cause aggressive behavior - continue with restraints if need if ativan and haldol PRN - PT consulted - Patient is continued on Rocephin for now, tolerating  -Patient remains encephalopathic. Continue with anxiolytics and restraints as needed for safety -consulted psychiatry-->increase depakote and agrees with seroquel -check B12 -TSH--2.282 -RPR--pending  Hyperammonemia -Ammonia 75 -unclear clinical significance as pt is awake and alert, but aggressive -recheck in am  Active Problems: Pyuria - UA with TNTC WBC - urine culture= multiple organisms - finish 3 days ceftriaxone  Hypokalemia - replaced -Currently within normal limits -check mag   Disposition Plan:   ALF/SNF 6/12 Family Communication:   Spouse updated at bedside--Total  time spent 35 minutes.  Greater than 50% spent face to face counseling and coordinating care.   Consultants:  psychiatry  Code Status:  FULL   DVT Prophylaxis:  SCDs   Procedures: As Listed in Progress Note Above  Antibiotics: None    Subjective: Pt is agitated and confused.  ROS limited due to encephalopathy.  Denies CP, sob, abd pain.  No reports of vomiting, diarrhea.  Objective: Vitals:   02/05/17 0555 02/05/17 1500 02/06/17 0445 02/06/17 1522  BP: (!) 144/88 (!) 132/92 132/86 128/80  Pulse: 60 (!) 114 64 86  Resp: 16 18 18 18   Temp: 98 F (36.7 C)  98.5 F (36.9 C) 98.8 F (37.1 C)  TempSrc: Axillary  Axillary Oral  SpO2: 100%  99% 99%  Weight: 68.8 kg (151 lb 10.8 oz)  67.5 kg (148 lb 13 oz)   Height:        Intake/Output Summary (Last 24 hours) at 02/06/17 1620 Last data filed at 02/06/17 1500  Gross per 24 hour  Intake             2600 ml  Output             2200 ml  Net              400 ml   Weight change: -1.3 kg (-2 lb 13.9 oz) Exam:   General:  Pt is alert, follows commands appropriately, not in acute distress  HEENT: No icterus, No thrush, No neck mass, [place]/AT  Cardiovascular: RRR, S1/S2, no rubs, no gallops  Respiratory: poor inspiratory effort, but CTA, no wheeze  Abdomen: Soft/+BS, non tender, non distended, no guarding  Extremities: No edema, No  lymphangitis, No petechiae, No rashes, no synovitis   Data Reviewed: I have personally reviewed following labs and imaging studies Basic Metabolic Panel:  Recent Labs Lab 02/03/17 1357 02/04/17 0640 02/05/17 0535 02/06/17 0536  NA 143 141 143 142  K 3.5 3.2* 4.1 4.3  CL 111 108 110 108  CO2 26 25 26 27   GLUCOSE 93 104* 85 90  BUN 18 10 8 8   CREATININE 0.78 0.55 0.65 0.65  CALCIUM 8.9 8.6* 9.1 9.2   Liver Function Tests:  Recent Labs Lab 02/03/17 1357  AST 21  ALT 13*  ALKPHOS 89  BILITOT 0.7  PROT 6.2*  ALBUMIN 3.4*   No results for input(s): LIPASE, AMYLASE in the  last 168 hours.  Recent Labs Lab 02/06/17 1504  AMMONIA 75*   Coagulation Profile: No results for input(s): INR, PROTIME in the last 168 hours. CBC:  Recent Labs Lab 02/03/17 1357 02/04/17 0640  WBC 6.9 8.0  NEUTROABS 4.2  --   HGB 12.9 13.1  HCT 38.0 38.3  MCV 83.5 83.3  PLT 210 216   Cardiac Enzymes: No results for input(s): CKTOTAL, CKMB, CKMBINDEX, TROPONINI in the last 168 hours. BNP: Invalid input(s): POCBNP CBG:  Recent Labs Lab 02/04/17 0851 02/05/17 0728  GLUCAP 146* 78   HbA1C: No results for input(s): HGBA1C in the last 72 hours. Urine analysis:    Component Value Date/Time   COLORURINE YELLOW 02/03/2017 1357   APPEARANCEUR HAZY (A) 02/03/2017 1357   LABSPEC 1.020 02/03/2017 1357   PHURINE 5.0 02/03/2017 1357   GLUCOSEU NEGATIVE 02/03/2017 1357   HGBUR MODERATE (A) 02/03/2017 1357   BILIRUBINUR NEGATIVE 02/03/2017 1357   KETONESUR 5 (A) 02/03/2017 1357   PROTEINUR 30 (A) 02/03/2017 1357   NITRITE NEGATIVE 02/03/2017 1357   LEUKOCYTESUR LARGE (A) 02/03/2017 1357   Sepsis Labs: @LABRCNTIP (procalcitonin:4,lacticidven:4) ) Recent Results (from the past 240 hour(s))  Culture, Urine     Status: Abnormal   Collection Time: 02/03/17  1:57 PM  Result Value Ref Range Status   Specimen Description URINE, RANDOM  Final   Special Requests NONE  Final   Culture MULTIPLE SPECIES PRESENT, SUGGEST RECOLLECTION (A)  Final   Report Status 02/05/2017 FINAL  Final     Scheduled Meds: . divalproex  250 mg Oral TID WC  . memantine  10 mg Oral BID  . QUEtiapine  25 mg Oral QHS  . Rivastigmine  13.3 mg Topical Daily  . sodium chloride flush  3 mL Intravenous Q12H   Continuous Infusions: . sodium chloride 75 mL/hr at 02/06/17 1337  . cefTRIAXone (ROCEPHIN)  IV Stopped (02/05/17 1516)    Procedures/Studies: No results found.  Eino Whitner, DO  [facility] Pager [phone]  If 7PM-7AM, please contact night-coverage [email or web address] Password  [email or web address] 02/06/2017, 4:20 PM   LOS: 3 days

## 2017-02-06 NOTE — Progress Notes (Signed)
[phone]) to inquire about patient's ability to return at discharge. CSW spoke with staff member Claris CheMargaret, who reported that patient will need to be reevaluated prior to being able to return to facility. Staff reported that someone from their facility would come out to evaluate patient when patient is ready for discharge. CSW agreed to keep staff/facility updated. CSW will continue to follow assist patient/patient's husband with discharge planning back to ALF.   Celso SickleKimberly Ishan Sanroman, [place]CSWA Clinical Social Worker [facility] Cell#: [phone]

## 2017-02-06 NOTE — Consult Note (Signed)
[facility] Face-to-Face Psychiatry Consult   Reason for Consult:  Dementia with behavioral problem Referring Physician:  Dr. Carles Collet Patient Identification: Toni Sullivan MRN:  [id number] Principal Diagnosis: Acute metabolic encephalopathy Diagnosis:   Patient Active Problem List   Diagnosis Date Noted  . Acute metabolic encephalopathy [P61.95] 02/03/2017  . Acute lower UTI [N39.0] 02/03/2017  . 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 History of CVA (cerebrovascular accident) [Z86.73] 07/03/2013    Total Time spent with patient: 45 minutes  Subjective:   Toni Sullivan is a 66 y.o. female patient admitted with agitation, aggression, confusion and questionable UTI.  HPI:  Toni Sullivan is a 66 y.o. female with medical history significant significant for dementia and stroke admitted to [facility] from recent assisted living facility due to increased agitation and aggressive behavior. Patient is awake, alert but not oriented to time place person and situation. Patient husband is at bedside reported that she has been diagnosed with dementia 2011 and at the same time she had a stroke and followed up by a neurologist who has been prescribing anti-dementia medication Namenda and Exelon patch. Patient was recently started Depakote 125 mg 3 times daily for controlling it agitation which was not helpful. Patient has been reported patient cannot care for herself, reportedly messing her close, toilet and smearing, disturbed sleep history get as stated and slamming the dose and found walking to the mailbox and talking to the mailbox. Patient has 2 children and married for 38 weeks with her husband. Patient does not have a capacity to make her own medical dishes and living arrangements and patient husband has medical care power of attorney and legal guardianship. Patient husband agreed increasing her Depakote doses and add on  Seroquel for controlling her agitation and aggressive behavior.  Past Psychiatric History: Dementia diagnosed since 2011 and has no history of acute psychiatric hospitalization or outpatient medication management.  Risk to Self: Is patient at risk for suicide?: No, but patient needs Medical Clearance Risk to Others:   Prior Inpatient Therapy:   Prior Outpatient Therapy:    Past Medical History:  Past Medical History:  Diagnosis Date  . Headache(784.0)   . Memory loss     Past Surgical History:  Procedure Laterality Date  . MOUTH SURGERY    . SKIN CANCER EXCISION     Family History: No family history on file. Family Psychiatric  History: Unknown Social History:  History  Alcohol Use  . 0.0 oz/week    Comment: 1 glass daily     History  Drug Use No    Social History   Social History  . Marital status: Married    Spouse name: Juleen China  . Number of children: 2  . Years of education: 12   Occupational History  . retired Unemployed   Social History Main Topics  . Smoking status: Never Smoker  . Smokeless tobacco: Never Used  . Alcohol use 0.0 oz/week     Comment: 1 glass daily  . Drug use: No  . Sexual activity: Not Asked   Other Topics Concern  . None   Social History Narrative   Patient is married Juleen China) and lives with her husband.   Patient has two children.   Patient is retired.   Patient has a high school education.   Patient is right-handed.   Patient drinks 5-6 cups of coffee daily.         Additional  Social History:    Allergies:   Allergies  Allergen Reactions  . Penicillins     Has patient had a PCN reaction causing immediate rash, facial/tongue/throat swelling, SOB or lightheadedness with hypotension: Unknown Has patient had a PCN reaction causing severe rash involving mucus membranes or skin necrosis: Unknown Has patient had a PCN reaction that required hospitalization: Unknown Has patient had a PCN reaction occurring within the last 10  years: No If all of the above answers are "NO", then may proceed with Cephalosporin use.     Labs:  Results for orders placed or performed during the hospital encounter of 02/03/17 (from the past 48 hour(s))  Basic metabolic panel     Status: None   Collection Time: 02/05/17  5:35 AM  Result Value Ref Range   Sodium 143 135 - 145 mmol/L   Potassium 4.1 3.5 - 5.1 mmol/L    Comment: DELTA CHECK NOTED REPEATED TO VERIFY SLIGHT HEMOLYSIS    Chloride 110 101 - 111 mmol/L   CO2 26 22 - 32 mmol/L   Glucose, Bld 85 65 - 99 mg/dL   BUN 8 6 - 20 mg/dL   Creatinine, Ser 0.65 0.44 - 1.00 mg/dL   Calcium 9.1 8.9 - 10.3 mg/dL   GFR calc non Af Amer >60 >60 mL/min   GFR calc Af Amer >60 >60 mL/min    Comment: (NOTE) The eGFR has been calculated using the CKD EPI equation. This calculation has not been validated in all clinical situations. eGFR's persistently <60 mL/min signify possible Chronic Kidney Disease.    Anion gap 7 5 - 15  Glucose, capillary     Status: None   Collection Time: 02/05/17  7:28 AM  Result Value Ref Range   Glucose-Capillary 78 65 - 99 mg/dL  Basic metabolic panel     Status: None   Collection Time: 02/06/17  5:36 AM  Result Value Ref Range   Sodium 142 135 - 145 mmol/L   Potassium 4.3 3.5 - 5.1 mmol/L   Chloride 108 101 - 111 mmol/L   CO2 27 22 - 32 mmol/L   Glucose, Bld 90 65 - 99 mg/dL   BUN 8 6 - 20 mg/dL   Creatinine, Ser 0.65 0.44 - 1.00 mg/dL   Calcium 9.2 8.9 - 10.3 mg/dL   GFR calc non Af Amer >60 >60 mL/min   GFR calc Af Amer >60 >60 mL/min    Comment: (NOTE) The eGFR has been calculated using the CKD EPI equation. This calculation has not been validated in all clinical situations. eGFR's persistently <60 mL/min signify possible Chronic Kidney Disease.    Anion gap 7 5 - 15    Current Facility-Administered Medications  Medication Dose Route Frequency Provider Last Rate Last Dose  . 0.9 %  sodium chloride infusion   Intravenous Continuous  Robbie Lis, MD 75 mL/hr at 02/05/17 2047    . acetaminophen (TYLENOL) tablet 650 mg  650 mg Oral Q6H PRN Robbie Lis, MD       Or  . acetaminophen (TYLENOL) suppository 650 mg  650 mg Rectal Q6H PRN Robbie Lis, MD      . cefTRIAXone (ROCEPHIN) 1 g in dextrose 5 % 50 mL IVPB  1 g Intravenous Q24H Lacretia Leigh, MD   Stopped at 02/05/17 1516  . divalproex (DEPAKOTE SPRINKLE) capsule 125 mg  125 mg Oral TID WC Robbie Lis, MD   125 mg at 02/06/17 0801  . haloperidol lactate (HALDOL)  injection 2 mg  2 mg Intramuscular Q6H PRN Robbie Lis, MD      . LORazepam (ATIVAN) injection 1 mg  1 mg Intravenous Q6H PRN Robbie Lis, MD   1 mg at 02/05/17 0756  . memantine (NAMENDA) tablet 10 mg  10 mg Oral BID Robbie Lis, MD   10 mg at 02/06/17 0801  . ondansetron (ZOFRAN) tablet 4 mg  4 mg Oral Q6H PRN Robbie Lis, MD       Or  . ondansetron [facility]) injection 4 mg  4 mg Intravenous Q6H PRN Robbie Lis, MD      . QUEtiapine (SEROQUEL) tablet 25 mg  25 mg Oral Benay Pike, MD      . Rivastigmine PT24 13.3 mg  13.3 mg Topical Daily Robbie Lis, MD   13.3 mg at 02/05/17 0810  . sodium chloride flush (NS) 0.9 % injection 3 mL  3 mL Intravenous Q12H Robbie Lis, MD        Musculoskeletal: Strength & Muscle Tone: within normal limits Gait & Station: unable to stand Patient leans: N/A  Psychiatric Specialty Exam: Physical Exam as per history and physical   ROS unable to complete due to her current mental status.   Blood pressure 132/86, pulse 64, temperature 98.5 F (36.9 C), temperature source Axillary, resp. rate 18, height _0  (1.626 m), weight 67.5 kg (148 lb 13 oz), SpO2 99 %.Body mass index is 25.54 kg/m.  General Appearance: Bizarre and Disheveled  Eye Contact:  Fair  Speech:  clear and incoherant  Volume:  Normal  Mood:  Anxious  Affect:  Labile  Thought Process:  Disorganized and Irrelevant  Orientation:  Negative  Thought Content:  she has empty talk  without substance.  Suicidal Thoughts:  No  Homicidal Thoughts:  No  Memory:  Immediate;   Poor Recent;   Poor Remote;   Poor  Judgement:  Poor  Insight:  Lacking  Psychomotor Activity:  Increased  Concentration:  Concentration: Poor and Attention Span: Poor  Recall:  Poor  Fund of Knowledge:  Poor  Language:  Fair  Akathisia:  Negative  Handed:  Right  AIMS (if indicated):     Assets:  Catering manager Housing Leisure Time Social Support Transportation  ADL's:  Impaired  Cognition:  Impaired,  Severe  Sleep:        Treatment Plan Summary: 66 years old female with history of dementia and recent placement at SNF with memory unit x one week, sent to the emergency department with increased irritability, agitation and aggressive behaviors towards to staff members. Patient was previously diagnosed dementia by a neurologist and also following up with her current medication management.   Dementia with behavioral problems including agitation and aggressive behavior  Patient required 5 point restraints to prevent harm to other people and herself  Medication management:  Will increase Depakote ER 250 mg 3 times daily, monitor for the therapeutic levels of valproic acid level in few days We'll continue Seroquel 25 mg twice daily   Daily contact with patient to assess and evaluate symptoms and progress in treatment and Medication management  Appreciate psychiatric consultation and follow up as clinically required Please contact [phone] or [phone] if needs further assistance  Disposition: Patient may benefit from the geriatric psychiatric placement if not will refer her to skilled nursing facility with memory unit.  Supportive therapy provided about ongoing stressors.  Ambrose Finland, MD 02/06/2017 11:10 AM

## 2017-02-07 LAB — BASIC METABOLIC PANEL
Anion gap: 8 (ref 5–15)
BUN: 13 mg/dL (ref 6–20)
CALCIUM: 9.2 mg/dL (ref 8.9–10.3)
CO2: 28 mmol/L (ref 22–32)
CREATININE: 0.73 mg/dL (ref 0.44–1.00)
Chloride: 107 mmol/L (ref 101–111)
GFR calc non Af Amer: >60 mL/min (ref >60)
Glucose, Bld: 87 mg/dL (ref 65–99)
Potassium: 3.7 mmol/L (ref 3.5–5.1)
SODIUM: 143 mmol/L (ref 135–145)

## 2017-02-07 LAB — URINE CULTURE: Culture: NO GROWTH

## 2017-02-07 LAB — GLUCOSE, CAPILLARY: GLUCOSE-CAPILLARY: 82 mg/dL (ref 65–99)

## 2017-02-07 LAB — HIV ANTIBODY (ROUTINE TESTING W REFLEX): HIV SCREEN 4TH GENERATION: NONREACTIVE

## 2017-02-07 LAB — RPR: RPR Ser Ql: NONREACTIVE

## 2017-02-07 LAB — MAGNESIUM: Magnesium: 2 mg/dL (ref 1.7–2.4)

## 2017-02-07 LAB — AMMONIA: AMMONIA: 15 umol/L (ref 9–35)

## 2017-02-07 MED ORDER — DIVALPROEX SODIUM 125 MG PO CSDR: 250.0000 mg | DELAYED_RELEASE_CAPSULE | Freq: Three times a day (TID) | ORAL | 1 refills | Status: DC

## 2017-02-07 MED ORDER — LORAZEPAM 2 MG/ML PO CONC: 0.6000 mg | Freq: Two times a day (BID) | ORAL | 0 refills | Status: DC | PRN

## 2017-02-07 MED ORDER — QUETIAPINE FUMARATE 25 MG PO TABS
25.0000 mg | ORAL_TABLET | Freq: Two times a day (BID) | ORAL | Status: DC
  Administered 2017-02-07 – 2017-02-16 (×16): 25 mg via ORAL
  Filled 2017-02-07 (×18): qty 1

## 2017-02-07 MED ORDER — QUETIAPINE FUMARATE 25 MG PO TABS: 25.0000 mg | ORAL_TABLET | Freq: Two times a day (BID) | ORAL | 0 refills | Status: DC

## 2017-02-07 NOTE — Progress Notes (Signed)
[facility] and staff member Claris CheMargaret informed CSW that patient would be evaluated by ED. Staff advised CSW to contact ED. CSW contacted staff member [phone]) and left voice message for ED requesting return call about patient's need for evaluation to return back to facility. CSW will continue to reach out to facility regarding evaluation for patient's return to ALF.    Celso SickleKimberly Jamiah Homeyer, [place] Clinical Social Worker [facility] Cell#: [phone]

## 2017-02-07 NOTE — Progress Notes (Signed)
Physical Therapy Treatment Patient Details Name: Toni Sullivan MRN: [id number] DOB: Jul 26, 1951 Today's Date: 02/07/2017    History of Present Illness 66 yo female admitted with UTI, acute metabolic encephalopathy, agitation, aggression. Hx of Alz, CVA, osteopenia.     PT Comments    Pt required increased assistance on today likely due to drowsiness. A few minutes were spent getting her to open her eyes/wake up. She was able to participate. No aggression occurred this session. She required some redirection intermittently. Discharge plan is dependent on whether ALF will take pt back. May have to consider SNF if ALF is not an option. Will continue to follow.     Follow Up Recommendations  Home health PT;Supervision/Assistance - 24 hour (at ALF if they can provide current level of assist. If ALF is not an option, then may have to consider SNF)     Equipment Recommendations       Recommendations for Other Services       Precautions / Restrictions Precautions Precautions: Fall Restrictions Weight Bearing Restrictions: No    Mobility  Bed Mobility Overal bed mobility: Needs Assistance Bed Mobility: Sit to Supine       Sit to supine: Mod assist   General bed mobility comments: Assist for trunk and LEs. Pt did not really want to return to bed.   Transfers Overall transfer level: Needs assistance Equipment used: 2 person hand held assist Transfers: Sit to/from Stand Sit to Stand: Min assist;+2 physical assistance;+2 safety/equipment         General transfer comment: Assist to rise, stabilize, control descent. Unsteady.   Ambulation/Gait Ambulation/Gait assistance: Min assist;+2 physical assistance;+2 safety/equipment Ambulation Distance (Feet): 100 Feet Assistive device: 2 person hand held assist Gait Pattern/deviations: Step-through pattern;Decreased stride length     General Gait Details: Assist to stabilize throughout ambulation distance. Multiple loses of  balance.    Stairs            Wheelchair Mobility    Modified Rankin (Stroke Patients Only)       Balance Overall balance assessment: Needs assistance           Standing balance-Leahy Scale: Poor                              Cognition Arousal/Alertness: Awake/alert Behavior During Therapy: WFL for tasks assessed/performed Overall Cognitive Status: History of cognitive impairments - at baseline                                        Exercises      General Comments        Pertinent Vitals/Pain Pain Assessment: No/denies pain    Home Living                      Prior Function            PT Goals (current goals can now be found in the care plan section) Progress towards PT goals: Progressing toward goals    Frequency    Min 3X/week      PT Plan Current plan remains appropriate    Co-evaluation              AM-PAC PT "6 Clicks" Daily Activity  Outcome Measure  Difficulty turning over in bed (including adjusting bedclothes, sheets and blankets)?: A Lot Difficulty moving  from lying on back to sitting on the side of the bed? : A Lot Difficulty sitting down on and standing up from a chair with arms (e.g., wheelchair, bedside commode, etc,.)?: A Lot Help needed moving to and from a bed to chair (including a wheelchair)?: A Lot Help needed walking in hospital room?: A Lot Help needed climbing 3-5 steps with a railing? : A Lot 6 Click Score: 12    End of Session Equipment Utilized During Treatment: Gait belt Activity Tolerance: Patient tolerated treatment well Patient left: in bed;with call bell/phone within reach;with family/visitor present;with bed alarm set   PT Visit Diagnosis: Muscle weakness (generalized) (M62.81);Difficulty in walking, not elsewhere classified (R26.2)     Time: [id number] PT Time Calculation (min) (ACUTE ONLY): 14 min  Charges:  $Gait Training: 8-22 mins                    G  Codes:          Rebeca AlertJannie Natisha Trzcinski, MPT Pager: [phone]

## 2017-02-07 NOTE — Discharge Summary (Signed)
Physician Discharge Summary  Toni Sullivan ZOX:[id number] DOB: July 31, 1951 DOA: 02/03/2017  PCP: Excell Seltzer, MD  Admit date: 02/03/2017 Discharge date: 02/08/17  Admitted From: Home Disposition:  ALF  Recommendations for Outpatient Follow-up:  1. Follow up with PCP in 1-2 weeks 2. Please obtain BMP/CBC in one week   Home Health: YES Equipment/Devices:HHPT  Discharge Condition: Stable CODE STATUS: FULL Diet recommendation: Heart Healthy / Carb Modified / Dysphagia / Regular   Brief/Interim Summary: 66 y.o.femalewith hx of dementia and no other documented chronic medical problems who presented to ED with worsening mental status changes. Patient husband is at the bedside reported pt has developed more aggressive, confused, combative behavior x 2-3 weeks. Apparently she has as needed ativan but did not receive any recently. After further discussion with the patient's husband, it was revealed that the patient has had a long history of cognitive decline and intermittent episodes of aggressive behavior. It was only when she was sent to [facility] ALF, the patient became even more agitated.   As a result she was discharged from ALF due to very aggressive behavior. No respiratory distress. No abdominal pain, nausea or vomiting. No fevers or chills. No cough, no chest pain. No falls or lightheadedness or loss of consciousness.  Patient husband is at bedside reported that she has been diagnosed with dementia 2011 and at the same time she had a stroke and followed up by a neurologist who has been prescribing.  Patient was recently started Depakote 125 mg 3 times daily for controlling it agitation which was not helpful. 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 - Likely dementia progresssion; unclear if pt truly had UTI - d/c memantine as this can cause  aggressive behavior - continue rivastigmine patch - continue with restraints if need if ativan and haldol PRN - PT consulted-->HHPT at ALF - finished 4 days ceftriaxone IV--little to no improvement in mental status -Patient remained agitated and encephalopathic -consulted psychiatry-->increase depakote and agrees with seroquel -check B12--571 -TSH--2.282 -RPR--neg -discharge with depakote 250 mg tid and seroquel 25 mg bid  Hyperammonemia -Ammonia 75-->recheck = 15 without tx -likely initial spurious result  Active Problems: Pyuria - UA with TNTC WBC - urine culture= multiple organisms - finished 4 days ceftriaxone  Hypokalemia - replaced -Currently within normal limits -check mag--2.0   Discharge Instructions   Allergies as of 02/07/2017      Reactions   Penicillins    Has patient had a PCN reaction causing immediate rash, facial/tongue/throat swelling, SOB or lightheadedness with hypotension: Unknown Has patient had a PCN reaction causing severe rash involving mucus membranes or skin necrosis: Unknown Has patient had a PCN reaction that required hospitalization: Unknown Has patient had a PCN reaction occurring within the last 10 years: No If all of the above answers are "NO", then may proceed with Cephalosporin use.      Medication List    STOP taking these medications   memantine 10 MG tablet Commonly known as:  NAMENDA     TAKE these medications   divalproex 125 MG capsule Commonly known as:  DEPAKOTE SPRINKLE Take 2 capsules (250 mg total) by mouth 3 (three) times daily. With meals What changed:  how much to take   LORazepam 2 MG/ML concentrated solution Commonly known as:  LORAZEPAM INTENSOL Take 0.3 mLs (0.6 mg total) by mouth 2 (two) times daily as needed for anxiety.  What changed:  Another medication with the same name was removed. Continue taking this medication, and follow the directions you see here.   QUEtiapine 25 MG tablet Commonly known as:   SEROQUEL Take 1 tablet (25 mg total) by mouth 2 (two) times daily.   Rivastigmine 13.3 MG/24HR Pt24 Commonly known as:  EXELON Apply 1 patch (13.3 mg total) topically daily.       Allergies  Allergen Reactions  . Penicillins     Has patient had a PCN reaction causing immediate rash, facial/tongue/throat swelling, SOB or lightheadedness with hypotension: Unknown Has patient had a PCN reaction causing severe rash involving mucus membranes or skin necrosis: Unknown Has patient had a PCN reaction that required hospitalization: Unknown Has patient had a PCN reaction occurring within the last 10 years: No If all of the above answers are "NO", then may proceed with Cephalosporin use.     Consultations:  psychiatry   Procedures/Studies: No results found.      Discharge Exam: Vitals:   02/07/17 1014 02/07/17 1300  BP: 116/86 137/64  Pulse: 81 77  Resp: 18 20  Temp:  97.6 F (36.4 C)   Vitals:   02/06/17 2038 02/07/17 0405 02/07/17 1014 02/07/17 1300  BP: 129/77 136/89 116/86 137/64  Pulse: 87 70 81 77  Resp: 17 18 18 20   Temp: 98.7 F (37.1 C) 97.7 F (36.5 C)  97.6 F (36.4 C)  TempSrc: Oral Axillary  Oral  SpO2: 97% 100% 100% 100%  Weight:  68.8 kg (151 lb 10.8 oz)    Height:        General: Pt is alert, awake, not in acute distress Cardiovascular: RRR, S1/S2 +, no rubs, no gallops Respiratory: CTA bilaterally, no wheezing, no rhonchi Abdominal: Soft, NT, ND, bowel sounds + Extremities: no edema, no cyanosis   The results of significant diagnostics from this hospitalization (including imaging, microbiology, ancillary and laboratory) are listed below for reference.    Significant Diagnostic Studies: No results found.   Microbiology: Recent Results (from the past 240 hour(s))  Culture, Urine     Status: Abnormal   Collection Time: 02/03/17  1:57 PM  Result Value Ref Range Status   Specimen Description URINE, RANDOM  Final   Special Requests NONE   Final   Culture MULTIPLE SPECIES PRESENT, SUGGEST RECOLLECTION (A)  Final   Report Status 02/05/2017 FINAL  Final  Culture, Urine     Status: None   Collection Time: 02/05/17  5:35 PM  Result Value Ref Range Status   Specimen Description URINE, CLEAN CATCH  Final   Special Requests NONE  Final   Culture   Final    NO GROWTH Performed at [facility], [address] [place], [place] [address]    Report Status 02/07/2017 FINAL  Final     Labs: Basic Metabolic Panel:  Recent Labs Lab 02/03/17 1357 02/04/17 0640 02/05/17 0535 02/06/17 0536 02/07/17 0703  NA 143 141 143 142 143  K 3.5 3.2* 4.1 4.3 3.7  CL 111 108 110 108 107  CO2 26 25 26 27 28   GLUCOSE 93 104* 85 90 87  BUN 18 10 8 8 13   CREATININE 0.78 0.55 0.65 0.65 0.73  CALCIUM 8.9 8.6* 9.1 9.2 9.2  MG  --   --   --   --  2.0   Liver Function Tests:  Recent Labs Lab 02/03/17 1357  AST 21  ALT 13*  ALKPHOS 89  BILITOT 0.7  PROT  6.2*  ALBUMIN 3.4*   No results for input(s): LIPASE, AMYLASE in the last 168 hours.  Recent Labs Lab 02/06/17 1504 02/07/17 0703  AMMONIA 75* 15   CBC:  Recent Labs Lab 02/03/17 1357 02/04/17 0640  WBC 6.9 8.0  NEUTROABS 4.2  --   HGB 12.9 13.1  HCT 38.0 38.3  MCV 83.5 83.3  PLT 210 216   Cardiac Enzymes: No results for input(s): CKTOTAL, CKMB, CKMBINDEX, TROPONINI in the last 168 hours. BNP: Invalid input(s): POCBNP CBG:  Recent Labs Lab 02/04/17 0851 02/05/17 0728 02/06/17 2049 02/07/17 0749  GLUCAP 146* 78 110* 82    Time coordinating discharge:  Greater than 30 minutes  Signed:  Ardyn Forge, DO [facility] Pager: [phone] 02/07/2017, 3:51 PM

## 2017-02-07 NOTE — Care Management Important Message (Signed)
Important Message  Patient Details  Name: Toni LibraBonnie G Scroggin MRN: [id number] Date of Birth: 01/26/1951   Medicare Important Message Given:  Yes    Caren MacadamFuller, Inesha Sow 02/07/2017, 10:44 AMImportant Message  Patient Details  Name: Toni LibraBonnie G Tippen MRN: [id number] Date of Birth: 07/03/1951   Medicare Important Message Given:  Yes    Caren MacadamFuller, Navin Dogan 02/07/2017, 10:42 AM

## 2017-02-08 LAB — GLUCOSE, CAPILLARY: GLUCOSE-CAPILLARY: 162 mg/dL — AB (ref 65–99)

## 2017-02-08 MED ORDER — HALOPERIDOL LACTATE 5 MG/ML IJ SOLN
2.0000 mg | Freq: Four times a day (QID) | INTRAMUSCULAR | Status: DC | PRN
  Administered 2017-02-09 (×2): 2 mg via INTRAMUSCULAR
  Filled 2017-02-08 (×4): qty 1

## 2017-02-08 MED ORDER — HALOPERIDOL LACTATE 5 MG/ML IJ SOLN: 2.0000 mg | Freq: Once | INTRAMUSCULAR | Status: DC

## 2017-02-08 NOTE — Consult Note (Signed)
[facility] Consult   Reason for Consult:  Dementia with behavioral problem Referring Physician:  Dr. Cruzita Lederer Patient Identification: Toni Sullivan MRN:  [id number] Principal Diagnosis: Acute metabolic encephalopathy Diagnosis:   Patient Active Problem List   Diagnosis Date Noted  . Aggressive behavior [R45.89]   . Urinary tract infection without hematuria [N39.0]   . Acute metabolic encephalopathy [P61.95] 02/03/2017  . Acute lower UTI [N39.0] 02/03/2017  . 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 History of CVA (cerebrovascular accident) [Z86.73] 07/03/2013    Total Time spent with patient: 45 minutes  Subjective:   Toni Sullivan is a 66 y.o. female patient admitted with agitation, aggression, confusion and questionable UTI.  HPI:  Toni Sullivan is a 66 y.o. female with medical history significant significant for dementia and stroke admitted to [facility] from recent assisted living facility due to increased agitation and aggressive behavior. Patient is awake, alert but not oriented to time place person and situation. Patient husband is at bedside reported that she has been diagnosed with dementia 2011 and at the same time she had a stroke and followed up by a neurologist who has been prescribing anti-dementia medication Namenda and Exelon patch. Patient was recently started Depakote 125 mg 3 times daily for controlling it agitation which was not helpful. Patient has been reported patient cannot care for herself, reportedly messing her close, toilet and smearing, disturbed sleep history get as stated and slamming the dose and found walking to the mailbox and talking to the mailbox. Patient has 2 children and married for 38 weeks with her husband. Patient does not have a capacity to make her own medical dishes and living arrangements and patient husband has medical care power of attorney  and legal guardianship. Patient husband agreed increasing her Depakote doses and add on Seroquel for controlling her agitation and aggressive behavior.  Past Psychiatric History: Dementia diagnosed since 2011 and has no history of acute psychiatric hospitalization or outpatient medication management.  02/08/2017 Interval History: Patient seen with a CSW as requested by the hospitalist for possible disposition to skilled nursing facility with a memory unit. Patient husband is at bedside reported that patient has been intermittently agitated but not aggressive or combative. Patient has been taking her medication as given to her. Patient does note difficulties with eating and sleeping. Patient is able to work with the physical therapist is morning. Patient continued to be poor historian secondary to moderate to severe dementia. Patient does not remember information about her and her surroundings. Patient gives the answers mostly "I do not know.".  Risk to Self: Is patient at risk for suicide?: No, but patient needs Medical Clearance Risk to Others:   Prior Inpatient Therapy:   Prior Outpatient Therapy:    Past Medical History:  Past Medical History:  Diagnosis Date  . Headache(784.0)   . Memory loss     Past Surgical History:  Procedure Laterality Date  . MOUTH SURGERY    . SKIN CANCER EXCISION     Family History: No family history on file. Family Psychiatric  History: Unknown Social History:  History  Alcohol Use  . 0.0 oz/week    Comment: 1 glass daily     History  Drug Use No    Social History   Social History  . Marital status: Married    Spouse name: Toni Sullivan  . Number of children: 2  . Years of education: 92  Occupational History  . retired Unemployed   Social History Main Topics  . Smoking status: Never Smoker  . Smokeless tobacco: Never Used  . Alcohol use 0.0 oz/week     Comment: 1 glass daily  . Drug use: No  . Sexual activity: Not Asked   Other Topics  Concern  . None   Social History Narrative   Patient is married Toni Sullivan) and lives with her husband.   Patient has two children.   Patient is retired.   Patient has a high school education.   Patient is right-handed.   Patient drinks 5-6 cups of coffee daily.         Additional Social History:    Allergies:   Allergies  Allergen Reactions  . Penicillins     Has patient had a PCN reaction causing immediate rash, facial/tongue/throat swelling, SOB or lightheadedness with hypotension: Unknown Has patient had a PCN reaction causing severe rash involving mucus membranes or skin necrosis: Unknown Has patient had a PCN reaction that required hospitalization: Unknown Has patient had a PCN reaction occurring within the last 10 years: No If all of the above answers are "NO", then may proceed with Cephalosporin use.     Labs:  Results for orders placed or performed during the hospital encounter of 02/03/17 (from the past 48 hour(s))  Vitamin B12     Status: None   Collection Time: 02/06/17  3:04 PM  Result Value Ref Range   Vitamin B-12 571 180 - 914 pg/mL    Comment: (NOTE) This assay is not validated for testing neonatal or myeloproliferative syndrome specimens for Vitamin B12 levels. Performed at [facility], [employer] [address] [place], [place] [address]   Ammonia     Status: Abnormal   Collection Time: 02/06/17  3:04 PM  Result Value Ref Range   Ammonia 75 (H) 9 - 35 umol/L  RPR     Status: None   Collection Time: 02/06/17  3:04 PM  Result Value Ref Range   RPR Ser Ql Non Reactive Non Reactive    Comment: (NOTE) Performed At: [facility] [address] [place], [place] [id number] Lindon Romp MD [id number]   HIV antibody     Status: None   Collection Time: 02/06/17  3:04 PM  Result Value Ref Range   HIV Screen 4th Generation wRfx Non Reactive Non Reactive    Comment: (NOTE) Performed At: [facility] [place], [place]  [id number] Lindon Romp MD [id number]   TSH     Status: None   Collection Time: 02/06/17  3:04 PM  Result Value Ref Range   TSH 2.282 0.350 - 4.500 uIU/mL    Comment: Performed by a 3rd Generation assay with a functional sensitivity of <=0.01 uIU/mL.  T4, free     Status: Abnormal   Collection Time: 02/06/17  3:04 PM  Result Value Ref Range   Free T4 1.25 (H) 0.61 - 1.12 ng/dL    Comment: (NOTE) Biotin ingestion may interfere with free T4 tests. If the results are inconsistent with the TSH level, previous test results, or the clinical presentation, then consider biotin interference. If needed, order repeat testing after stopping biotin. Performed at [facility], [place] [address] [place], [place] [address]   Glucose, capillary     Status: Abnormal   Collection Time: 02/06/17  8:49 PM  Result Value Ref Range   Glucose-Capillary 110 (H) 65 - 99 mg/dL  Ammonia  Status: None   Collection Time: 02/07/17  7:03 AM  Result Value Ref Range   Ammonia 15 9 - 35 umol/L  Magnesium     Status: None   Collection Time: 02/07/17  7:03 AM  Result Value Ref Range   Magnesium 2.0 1.7 - 2.4 mg/dL  Basic metabolic panel     Status: None   Collection Time: 02/07/17  7:03 AM  Result Value Ref Range   Sodium 143 135 - 145 mmol/L   Potassium 3.7 3.5 - 5.1 mmol/L   Chloride 107 101 - 111 mmol/L   CO2 28 22 - 32 mmol/L   Glucose, Bld 87 65 - 99 mg/dL   BUN 13 6 - 20 mg/dL   Creatinine, Ser 0.73 0.44 - 1.00 mg/dL   Calcium 9.2 8.9 - 10.3 mg/dL   GFR calc non Af Amer >60 >60 mL/min   GFR calc Af Amer >60 >60 mL/min    Comment: (NOTE) The eGFR has been calculated using the CKD EPI equation. This calculation has not been validated in all clinical situations. eGFR's persistently <60 mL/min signify possible Chronic Kidney Disease.    Anion gap 8 5 - 15  Glucose, capillary     Status: None   Collection Time: 02/07/17  7:49 AM  Result Value Ref Range   Glucose-Capillary 82 65 - 99  mg/dL   Comment 1 Notify RN   Glucose, capillary     Status: Abnormal   Collection Time: 02/08/17  8:05 AM  Result Value Ref Range   Glucose-Capillary 162 (H) 65 - 99 mg/dL    Current Facility-Administered Medications  Medication Dose Route Frequency Provider Last Rate Last Dose  . 0.9 %  sodium chloride infusion   Intravenous Continuous Robbie Lis, MD 75 mL/hr at 02/08/17 [phone]    . acetaminophen (TYLENOL) tablet 650 mg  650 mg Oral Q6H PRN Robbie Lis, MD       Or  . acetaminophen (TYLENOL) suppository 650 mg  650 mg Rectal Q6H PRN Robbie Lis, MD      . divalproex (DEPAKOTE SPRINKLE) capsule 250 mg  250 mg Oral TID WC Ambrose Finland, MD   250 mg at 02/08/17 9326  . haloperidol lactate (HALDOL) injection 2 mg  2 mg Intramuscular Q6H PRN Robbie Lis, MD   2 mg at 02/06/17 1000  . LORazepam (ATIVAN) injection 1 mg  1 mg Intravenous Q6H PRN Robbie Lis, MD   1 mg at 02/06/17 2155  . ondansetron (ZOFRAN) tablet 4 mg  4 mg Oral Q6H PRN Robbie Lis, MD       Or  . ondansetron [facility]) injection 4 mg  4 mg Intravenous Q6H PRN Robbie Lis, MD      . QUEtiapine (SEROQUEL) tablet 25 mg  25 mg Oral BID Orson Eva, MD   25 mg at 02/08/17 7124  . Rivastigmine PT24 13.3 mg  13.3 mg Topical Daily Robbie Lis, MD   13.3 mg at 02/07/17 1809  . sodium chloride flush (NS) 0.9 % injection 3 mL  3 mL Intravenous Q12H Robbie Lis, MD        Musculoskeletal: Strength & Muscle Tone: within normal limits Gait & Station: unable to stand Patient leans: N/A  Psychiatric Specialty Exam: Physical Exam as per history and physical   ROS unable to complete due to her current mental status.   Blood pressure 129/80, pulse 68, temperature 98.3 F (36.8 C), temperature source Axillary,  resp. rate 18, height '5\' 4"'$  (1.626 m), weight 68.8 kg (151 lb 10.8 oz), SpO2 100 %.Body mass index is 26.04 kg/m.  General Appearance: Bizarre and Disheveled  Eye Contact:  Fair  Speech:  clear  and incoherant  Volume:  Normal  Mood:  Anxious  Affect:  Labile  Thought Process:  Irrelevant  Orientation:  Negative  Thought Content:  she has empty talk without substance. mostly answered "I don't know"  Suicidal Thoughts:  No  Homicidal Thoughts:  No  Memory:  Immediate;   Poor Recent;   Poor Remote;   Poor  Judgement:  Poor  Insight:  Lacking  Psychomotor Activity:  Increased  Concentration:  Concentration: Poor and Attention Span: Poor  Recall:  Poor  Fund of Knowledge:  Poor  Language:  Fair  Akathisia:  Negative  Handed:  Right  AIMS (if indicated):     Assets:  Catering manager Housing Leisure Time Social Support Transportation  ADL's:  Impaired  Cognition:  Impaired,  Severe  Sleep:        Treatment Plan Summary: 66 years old female with history of dementia and recent placement at SNF with memory unit x one week, sent to the emergency department with increased irritability, agitation and aggressive behaviors towards to staff members. Patient was previously diagnosed dementia by a neurologist and also following up with her current medication management.   Patient has no reported aggressive behavior of combative behavior, required restraining per last 24 hours and psychiatrically stable enough to be discharged to skilled nursing facility with memory unit.  Dementia with behavioral problems including agitation and aggressive behavior  Medication management:  Continue Depakote ER 250 mg 3 times daily,  Monitor for the therapeutic levels of valproic acid level in  at this facility Continue Seroquel 25 mg twice daily   Appreciate psychiatric consultation and  sign off today Please contact [phone] or [phone] if needs further assistance  Disposition: Patient is psychiatrically cleared for the skilled nursing facility placement. Patient will refer to skilled nursing facility with [facility] unit at [facility], where patient has been placed at least 1 week  before coming to the hospital..  Supportive therapy provided about ongoing stressors.  Ambrose Finland, MD 02/08/2017 1:54 PM

## 2017-02-08 NOTE — Progress Notes (Signed)
Physical Therapy Treatment Patient Details Name: Toni LibraBonnie G Pietsch MRN: [id number] DOB: 09/17/1950 Today's Date: 02/08/2017    History of Present Illness 66 yo female admitted with UTI, acute metabolic encephalopathy, agitation, aggression. Hx of Alz, CVA, osteopenia.     PT Comments    Pt, with extended time and patience, was able to walk a good distance into the hallway with two person assist.  She does get easily agitated and did swing at me a few times.  It was helpful to have her family member there assisting as he was a familiar face and voice.  PT will continue to follow acutely.   Follow Up Recommendations  Home health PT;Supervision/Assistance - 24 hour (at ALF if ALF deems they can handle her or SNF if not)     Equipment Recommendations  None recommended by PT    Recommendations for Other Services   NA     Precautions / Restrictions Precautions Precautions: Fall Precaution Comments: she is unsteady on her feet    Mobility  Bed Mobility Overal bed mobility: Needs Assistance Bed Mobility: Supine to Sit     Supine to sit: +2 for physical assistance;Mod assist;HOB elevated     General bed mobility comments: Two person mod assist to get to sitting, pt initially resisting move to EOB, so multiple attempts to initiate movement of both legs and trunk.    Transfers Overall transfer level: Needs assistance Equipment used: 2 person hand held assist Transfers: Sit to/from Stand Sit to Stand: Min assist         General transfer comment: Two person min assist to help stabilize trunk to stand.  One person on each side.  Pt, again, initially did not want to stand up, but having family member there helped in coaxing her to move. Extra time needed to get her to cooperate/participate  Ambulation/Gait Ambulation/Gait assistance: +2 physical assistance;Mod assist;Min assist Ambulation Distance (Feet): 130 Feet Assistive device: 2 person hand held assist Gait  Pattern/deviations: Step-through pattern;Staggering left;Staggering right Gait velocity: decreased Gait velocity interpretation: Below normal speed for age/gender General Gait Details: Two person up to mod assist for balance and safety.  We stood statically in the hallway for 5 mins in an attempt to get pt to walk/convince her to walk, and then she did.  Pt with staggering gait pattern.           Balance Overall balance assessment: Needs assistance Sitting-balance support: Feet supported;No upper extremity supported;Bilateral upper extremity supported Sitting balance-Leahy Scale: Poor Sitting balance - Comments: posterior lean EOB, eyes closed and pt resisting forward motion initially   Standing balance support: Bilateral upper extremity supported Standing balance-Leahy Scale: Poor Standing balance comment: Pt can be as heavy as mod and as light as min gurad two person assist in standing, some of it depends on her resistance or agreeance to mobility.  Once buy-in occurs, she needs less assist.  It takes a bit of time to get pt convinced of the task at hand.                             Cognition Arousal/Alertness: Lethargic (at the beginning, better at the end) Behavior During Therapy: Flat affect;Agitated;Impulsive (easily agitated) Overall Cognitive Status: History of cognitive impairments - at baseline (worse than baseline) Area of Impairment: Orientation;Attention;Memory;Following commands;Safety/judgement;Awareness;Problem solving                 Orientation Level: Disoriented to;Place;Time;Situation Current Attention Level:  Sustained Memory: Decreased recall of precautions;Decreased short-term memory Following Commands: Follows one step commands inconsistently Safety/Judgement: Decreased awareness of safety;Decreased awareness of deficits Awareness: Intellectual Problem Solving: Slow processing;Decreased initiation;Difficulty sequencing;Requires verbal  cues;Requires tactile cues General Comments: Pt easily agitated and will swing to hit at times, eyes closed for the first 1/2 of our session, but gradually started to open them during gait, pt very internally distracted by lines and leads, gown, ties, and belt.  Female family member in room and very helpful during mobility (familiar voice and face).               Pertinent Vitals/Pain Pain Assessment: Faces Faces Pain Scale: No hurt           PT Goals (current goals can now be found in the care plan section) Acute Rehab PT Goals Patient Stated Goal: family hopeful for return to ALF Progress towards PT goals: Progressing toward goals    Frequency    Min 3X/week      PT Plan Current plan remains appropriate       AM-PAC PT "6 Clicks" Daily Activity  Outcome Measure  Difficulty turning over in bed (including adjusting bedclothes, sheets and blankets)?: Total Difficulty moving from lying on back to sitting on the side of the bed? : Total Difficulty sitting down on and standing up from a chair with arms (e.g., wheelchair, bedside commode, etc,.)?: Total Help needed moving to and from a bed to chair (including a wheelchair)?: A Lot Help needed walking in hospital room?: A Lot Help needed climbing 3-5 steps with a railing? : Total 6 Click Score: 8    End of Session Equipment Utilized During Treatment: Gait belt Activity Tolerance: Patient limited by lethargy Patient left: in chair;with call bell/phone within reach;with chair alarm set;with family/visitor present (asked family member to let RN know if he leaves) Nurse Communication: Mobility status;Other (comment) (family member in room) PT Visit Diagnosis: Muscle weakness (generalized) (M62.81);Difficulty in walking, not elsewhere classified (R26.2)     Time: 1610-9604 PT Time Calculation (min) (ACUTE ONLY): 26 min  Charges:  $Gait Training: 23-37 mins          Ferd Horrigan B. [place] Lohmeyer, PT, DPT [phone]             02/08/2017, 3:00 PM

## 2017-02-08 NOTE — Progress Notes (Signed)
PROGRESS NOTE  Toni Sullivan:[id number] DOB: Nov 07, 1950 DOA: 02/03/2017 PCP: Excell Seltzer, MD   LOS: 5 days   Brief Narrative / Interim history: 66 y.o.femalewho presented to ED from with worsening mental status changes. Patient husband is at the bedside reported pt has developed more aggressive, confused, combative behavior x 2-3 weeks. Apparently she has as needed ativan but did not receive any recently. She was discharged recently from ALF due to very aggressive behavior. No respiratory distress. No abdominal pain, nausea or vomiting. No fevers or chills. No cough, no chest pain. No falls or lightheadedness or loss of consciousness.  Patient husband is at bedside reported that she has been diagnosed with dementia 2011 and at the same time she had a stroke and followed up by a neurologist who has been prescribing.  Patient was recently started Depakote 125 mg 3 times daily for controlling it agitation which was not helpful. 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   Acute lower UTI   Aggressive behavior   Urinary tract infection without hematuria  Acute metabolic encephalopathy /Dementia in Alzheimer's disease with behavioral disturbance - Likely dementia progresssion; unclear if pt truly had UTI - d/c memantine as this can cause aggressive behavior - continue rivastigmine patch - continue with restraints if need if ativan and haldol PRN - PT consulted-->HHPT at ALF - finished 4 days ceftriaxone IV--little to no improvement in mental status -Patient remained agitated and encephalopathic -consulted psychiatry-->increase depakote and agrees with seroquel -check B12--571 -TSH--2.282 -RPR--neg -discharge with depakote 250 mg tid and seroquel 25 mg bid  Hyperammonemia -Ammonia 75-->recheck = 15  without tx -likely initial spurious result  Active Problems: Pyuria - UA with TNTC WBC - urine culture= multiple organisms - finished 4 days ceftriaxone  Hypokalemia - replaced -Currently within normal limits -check mag--2.0  DVT prophylaxis: SCD Code Status: Full Family Communication: husband bedside Disposition Plan: ALF if cleared by psych  Consultants:   Psychiatry   Procedures:   None   Antimicrobials:  Ceftriaxone x 4 days   Subjective: - no chest pain, shortness of breath, no abdominal pain, nausea or vomiting.   Objective: Vitals:   02/07/17 1014 02/07/17 1300 02/07/17 2007 02/08/17 0537  BP: 116/86 137/64 132/65 129/80  Pulse: 81 77 81 68  Resp: 18 20 18    Temp:  97.6 F (36.4 C) 98.7 F (37.1 C) 98.3 F (36.8 C)  TempSrc:  Oral Oral Axillary  SpO2: 100% 100% 97% 100%  Weight:      Height:        Intake/Output Summary (Last 24 hours) at 02/08/17 1330 Last data filed at 02/08/17 0857  Gross per 24 hour  Intake             3165 ml  Output             1900 ml  Net             1265 ml   Filed Weights   02/05/17 0555 02/06/17 0445 02/07/17 0405  Weight: 68.8 kg (151 lb 10.8 oz) 67.5 kg (148 lb 13 oz) 68.8 kg (151 lb 10.8 oz)    Examination:  Vitals:   02/07/17 1014 02/07/17 1300 02/07/17 2007 02/08/17 0537  BP: 116/86 137/64 132/65 129/80  Pulse: 81 77 81 68  Resp: 18 20 18  Temp:  97.6 F (36.4 C) 98.7 F (37.1 C) 98.3 F (36.8 C)  TempSrc:  Oral Oral Axillary  SpO2: 100% 100% 97% 100%  Weight:      Height:        Constitutional: NAD Eyes: lids and conjunctivae normal Respiratory: clear to auscultation Cardiovascular: Regular rate and rhythm, no murmurs   Data Reviewed: I have independently reviewed following labs and imaging studies  CBC:  Recent Labs Lab 02/03/17 1357 02/04/17 0640  WBC 6.9 8.0  NEUTROABS 4.2  --   HGB 12.9 13.1  HCT 38.0 38.3  MCV 83.5 83.3  PLT 210 216   Basic Metabolic Panel:  Recent  Labs Lab 02/03/17 1357 02/04/17 0640 02/05/17 0535 02/06/17 0536 02/07/17 0703  NA 143 141 143 142 143  K 3.5 3.2* 4.1 4.3 3.7  CL 111 108 110 108 107  CO2 26 25 26 27 28   GLUCOSE 93 104* 85 90 87  BUN 18 10 8 8 13   CREATININE 0.78 0.55 0.65 0.65 0.73  CALCIUM 8.9 8.6* 9.1 9.2 9.2  MG  --   --   --   --  2.0   GFR: Estimated Creatinine Clearance: 64.1 mL/min (by C-G formula based on SCr of 0.73 mg/dL). Liver Function Tests:  Recent Labs Lab 02/03/17 1357  AST 21  ALT 13*  ALKPHOS 89  BILITOT 0.7  PROT 6.2*  ALBUMIN 3.4*   No results for input(s): LIPASE, AMYLASE in the last 168 hours.  Recent Labs Lab 02/06/17 1504 02/07/17 0703  AMMONIA 75* 15   Coagulation Profile: No results for input(s): INR, PROTIME in the last 168 hours. Cardiac Enzymes: No results for input(s): CKTOTAL, CKMB, CKMBINDEX, TROPONINI in the last 168 hours. BNP (last 3 results) No results for input(s): PROBNP in the last 8760 hours. HbA1C: No results for input(s): HGBA1C in the last 72 hours. CBG:  Recent Labs Lab 02/04/17 0851 02/05/17 0728 02/06/17 2049 02/07/17 0749 02/08/17 0805  GLUCAP 146* 78 110* 82 162*   Lipid Profile: No results for input(s): CHOL, HDL, LDLCALC, TRIG, CHOLHDL, LDLDIRECT in the last 72 hours. Thyroid Function Tests:  Recent Labs  02/06/17 1504  TSH 2.282  FREET4 1.25*   Anemia Panel:  Recent Labs  02/06/17 1504  VITAMINB12 571   Urine analysis:    Component Value Date/Time   COLORURINE YELLOW 02/03/2017 1357   APPEARANCEUR HAZY (A) 02/03/2017 1357   LABSPEC 1.020 02/03/2017 1357   PHURINE 5.0 02/03/2017 1357   GLUCOSEU NEGATIVE 02/03/2017 1357   HGBUR MODERATE (A) 02/03/2017 1357   BILIRUBINUR NEGATIVE 02/03/2017 1357   KETONESUR 5 (A) 02/03/2017 1357   PROTEINUR 30 (A) 02/03/2017 1357   NITRITE NEGATIVE 02/03/2017 1357   LEUKOCYTESUR LARGE (A) 02/03/2017 1357   Sepsis Labs: Invalid input(s): PROCALCITONIN, LACTICIDVEN  Recent  Results (from the past 240 hour(s))  Culture, Urine     Status: Abnormal   Collection Time: 02/03/17  1:57 PM  Result Value Ref Range Status   Specimen Description URINE, RANDOM  Final   Special Requests NONE  Final   Culture MULTIPLE SPECIES PRESENT, SUGGEST RECOLLECTION (A)  Final   Report Status 02/05/2017 FINAL  Final  Culture, Urine     Status: None   Collection Time: 02/05/17  5:35 PM  Result Value Ref Range Status   Specimen Description URINE, CLEAN CATCH  Final   Special Requests NONE  Final   Culture   Final    NO GROWTH Performed at [facility], [address] [place], [place] [address]    Report Status 02/07/2017 FINAL  Final      Radiology Studies: No results found.   Scheduled Meds: . divalproex  250 mg Oral TID WC  . QUEtiapine  25 mg Oral BID  . Rivastigmine  13.3 mg Topical Daily  . sodium chloride flush  3 mL Intravenous Q12H   Continuous Infusions: . sodium chloride 75 mL/hr at 02/08/17 6045    Pamella Pert, MD, PhD [facility] Pager [phone]  If 7PM-7AM, please contact night-coverage [email or web address] Password [email or web address] 02/08/2017, 1:30 PM

## 2017-02-08 NOTE — Progress Notes (Addendum)
CSW [facility] by [facility] Ed [phone]), who agreed to come evaluate patient this afternoon for return to ALF. CSW will continue to follow up and assist with discharge planning to ALF.   3:00PM Patient evaluated by staff member Ed from ALF. Staff member reported that he would come re-evalaute patient in the am. Staff requested updated psychiatry note, patient is waiting to be seen by psychiatry. CSW will fax psychiatry note to facility when completed [phone]).  Celso SickleKimberly Borghild Thaker, [place] Clinical Social Worker [facility] Cell#: [phone]

## 2017-02-08 NOTE — Progress Notes (Signed)
[facility] and left voicemail for ED in regards to patient being evaluated for return back to ALF. CSW will continue to reach out to facility about patient's ability to return.   Celso SickleKimberly Yanna Leaks, [place]CS[place] Clinical Social Worker [facility] Cell#: [phone]

## 2017-02-09 LAB — GLUCOSE, CAPILLARY: GLUCOSE-CAPILLARY: 70 mg/dL (ref 65–99)

## 2017-02-09 MED ORDER — LORAZEPAM 2 MG/ML IJ SOLN
1.0000 mg | Freq: Four times a day (QID) | INTRAMUSCULAR | Status: DC | PRN
  Administered 2017-02-10 – 2017-02-16 (×6): 1 mg via INTRAVENOUS
  Filled 2017-02-09 (×3): qty 1

## 2017-02-09 MED ORDER — LORAZEPAM 2 MG/ML IJ SOLN
1.0000 mg | Freq: Four times a day (QID) | INTRAMUSCULAR | Status: DC | PRN
  Filled 2017-02-09 (×4): qty 1

## 2017-02-09 MED ORDER — LORAZEPAM 2 MG/ML IJ SOLN: 1.0000 mg | Freq: Four times a day (QID) | INTRAMUSCULAR | Status: DC | PRN

## 2017-02-09 NOTE — Progress Notes (Signed)
PROGRESS NOTE  Toni Sullivan ZOX:[id number] DOB: 08/13/1951 DOA: 02/03/2017 PCP: Excell SeltzerBedsole, Amy E, MD   LOS: 6 days   Brief Narrative / Interim history: 66 y.o.femalewho presented to ED from with worsening mental status changes. Patient husband is at the bedside reported pt has developed more aggressive, confused, combative behavior x 2-3 weeks. Apparently she has as needed ativan but did not receive any recently. She was discharged recently from ALF due to very aggressive behavior. No respiratory distress. No abdominal pain, nausea or vomiting. No fevers or chills. No cough, no chest pain. No falls or lightheadedness or loss of consciousness.  Patient husband is at bedside reported that she has been diagnosed with dementia 2011 and at the same time she had a stroke and followed up by a neurologist who has been prescribing.  Patient was recently started Depakote 125 mg 3 times daily for controlling it agitation which was not helpful. 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   Acute lower UTI   Aggressive behavior   Urinary tract infection without hematuria  Acute metabolic encephalopathy /Dementia in Alzheimer's disease with behavioral disturbance - Likely dementia progresssion; unclear if pt truly had UTI - d/c memantine as this can cause aggressive behavior - continue rivastigmine patch - continue with restraints if need if ativan and haldol PRN - PT consulted-->HHPT at ALF - finished 4 days ceftriaxone IV--little to no improvement in mental status -Patient remained agitated and encephalopathic -consulted psychiatry-->increase depakote and agrees with seroquel -check B12--571 -TSH--2.282 -RPR--neg -discharge with depakote 250 mg tid and seroquel 25 mg bid -Psychiatry cleared patient for a LF/S and F  discharge, discussed with Child psychotherapistsocial worker, discharge from bed available and she is accepted  Hyperammonemia -Ammonia 75-->recheck = 15 without tx -likely initial spurious result  Active Problems: Pyuria - UA with TNTC WBC - urine culture= multiple organisms - finished 4 days ceftriaxone  Hypokalemia - replaced -Currently within normal limits -check mag--2.0  DVT prophylaxis: SCD Code Status: Full Family Communication: husband bedside Disposition Plan: ALF when bed available  Consultants:   Psychiatry   Procedures:   None   Antimicrobials:  Ceftriaxone x 4 days   Subjective: -Does not want to interact with me today  Objective: Vitals:   02/08/17 2053 02/08/17 2110 02/09/17 0500 02/09/17 0600  BP: (!) 138/51 122/83  103/69  Pulse: 80 88  83  Resp: 18 18  16   Temp: 98.4 F (36.9 C) 97.9 F (36.6 C)  97.7 F (36.5 C)  TempSrc: Oral Axillary  Axillary  SpO2: 94% 93%  100%  Weight:   71.8 kg (158 lb 4.6 oz)   Height:        Intake/Output Summary (Last 24 hours) at 02/09/17 1105 Last data filed at 02/09/17 0639  Gross per 24 hour  Intake          2088.75 ml  Output                0 ml  Net          2088.75 ml   Filed Weights   02/06/17 0445 02/07/17 0405 02/09/17 0500  Weight: 67.5 kg (148 lb 13 oz) 68.8 kg (151 lb 10.8 oz) 71.8 kg (158 lb 4.6 oz)    Examination:  Vitals:   02/08/17 2053 02/08/17 2110 02/09/17 0500 02/09/17 0600  BP: (!) 138/51 122/83  103/69  Pulse: 80 88  83  Resp: 18 18  16   Temp: 98.4 F (36.9 C) 97.9 F (36.6 C)  97.7 F (36.5 C)  TempSrc: Oral Axillary  Axillary  SpO2: 94% 93%  100%  Weight:   71.8 kg (158 lb 4.6 oz)   Height:        Constitutional: NAD CV: RRR Pulm: CTA biL, no wheezing   Data Reviewed: I have independently reviewed following labs and imaging studies  CBC:  Recent Labs Lab 02/03/17 1357 02/04/17 0640  WBC 6.9 8.0  NEUTROABS 4.2  --   HGB 12.9 13.1  HCT 38.0 38.3  MCV 83.5 83.3  PLT  210 216   Basic Metabolic Panel:  Recent Labs Lab 02/03/17 1357 02/04/17 0640 02/05/17 0535 02/06/17 0536 02/07/17 0703  NA 143 141 143 142 143  K 3.5 3.2* 4.1 4.3 3.7  CL 111 108 110 108 107  CO2 26 25 26 27 28   GLUCOSE 93 104* 85 90 87  BUN 18 10 8 8 13   CREATININE 0.78 0.55 0.65 0.65 0.73  CALCIUM 8.9 8.6* 9.1 9.2 9.2  MG  --   --   --   --  2.0   GFR: Estimated Creatinine Clearance: 65.3 mL/min (by C-G formula based on SCr of 0.73 mg/dL). Liver Function Tests:  Recent Labs Lab 02/03/17 1357  AST 21  ALT 13*  ALKPHOS 89  BILITOT 0.7  PROT 6.2*  ALBUMIN 3.4*   No results for input(s): LIPASE, AMYLASE in the last 168 hours.  Recent Labs Lab 02/06/17 1504 02/07/17 0703  AMMONIA 75* 15   Coagulation Profile: No results for input(s): INR, PROTIME in the last 168 hours. Cardiac Enzymes: No results for input(s): CKTOTAL, CKMB, CKMBINDEX, TROPONINI in the last 168 hours. BNP (last 3 results) No results for input(s): PROBNP in the last 8760 hours. HbA1C: No results for input(s): HGBA1C in the last 72 hours. CBG:  Recent Labs Lab 02/05/17 0728 02/06/17 2049 02/07/17 0749 02/08/17 0805 02/09/17 0749  GLUCAP 78 110* 82 162* 70   Lipid Profile: No results for input(s): CHOL, HDL, LDLCALC, TRIG, CHOLHDL, LDLDIRECT in the last 72 hours. Thyroid Function Tests:  Recent Labs  02/06/17 1504  TSH 2.282  FREET4 1.25*   Anemia Panel:  Recent Labs  02/06/17 1504  VITAMINB12 571   Urine analysis:    Component Value Date/Time   COLORURINE YELLOW 02/03/2017 1357   APPEARANCEUR HAZY (A) 02/03/2017 1357   LABSPEC 1.020 02/03/2017 1357   PHURINE 5.0 02/03/2017 1357   GLUCOSEU NEGATIVE 02/03/2017 1357   HGBUR MODERATE (A) 02/03/2017 1357   BILIRUBINUR NEGATIVE 02/03/2017 1357   KETONESUR 5 (A) 02/03/2017 1357   PROTEINUR 30 (A) 02/03/2017 1357   NITRITE NEGATIVE 02/03/2017 1357   LEUKOCYTESUR LARGE (A) 02/03/2017 1357   Sepsis Labs: Invalid  input(s): PROCALCITONIN, LACTICIDVEN  Recent Results (from the past 240 hour(s))  Culture, Urine     Status: Abnormal   Collection Time: 02/03/17  1:57 PM  Result Value Ref Range Status   Specimen Description URINE, RANDOM  Final   Special Requests NONE  Final   Culture MULTIPLE SPECIES PRESENT, SUGGEST RECOLLECTION (A)  Final   Report Status 02/05/2017 FINAL  Final  Culture, Urine     Status: None   Collection Time: 02/05/17  5:35 PM  Result Value Ref Range Status   Specimen Description URINE, CLEAN CATCH  Final   Special Requests NONE  Final   Culture   Final    NO GROWTH Performed at [facility], [address], [place], [place] [address]    Report Status 02/07/2017 FINAL  Final      Radiology Studies: No results found.   Scheduled Meds: . divalproex  250 mg Oral TID WC  . QUEtiapine  25 mg Oral BID  . Rivastigmine  13.3 mg Topical Daily  . sodium chloride flush  3 mL Intravenous Q12H   Continuous Infusions: . sodium chloride 75 mL/hr at 02/08/17 1941    Pamella Pert, MD, PhD [facility] Pager [phone]  If 7PM-7AM, please contact night-coverage [email or web address] Password [email or web address] 02/09/2017, 11:05 AM
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
## 2017-02-09 NOTE — Progress Notes (Signed)
Patient pulled out IV while agitated this morning. Through the afternoon patient has calmed down. Discussed with MD and to prevent further agitation, will leave IV out and telemetry off.

## 2017-02-09 NOTE — Progress Notes (Signed)
CSW assisting with d/c planning. Pt was [facility]. Mr Tania AdeWeeks declined to readmit pt due to on going aggressive behavior. Mr. Tania AdeWeeks reports that they cannot manage pt's behavior in their memory care unit.  PT has recommended SNF ( memory care unit ) if ALF cannot manage pt at ALF level. CSW will imitate SNF search and provide bed offers to spouse once available. Pt has [employer] medicare and will require prior authorization for SNF placement.  CSW will contact insurance with request for authorization. CSW spent time with pt / spouse following ALF screening. Spouse was very tearful and overwhelmed with pt's situation. " I'm so sorry I'm upset. I just don't know what to do. " CSW will continue to provide ongoing support and assistance with d/c planning.  Cori RazorJamie Jamariya Davidoff LCSW [phone]

## 2017-02-10 LAB — GLUCOSE, CAPILLARY: Glucose-Capillary: 93 mg/dL (ref 65–99)

## 2017-02-10 NOTE — Care Management Note (Signed)
Case Management Note  Patient Details  Name: Toni Sullivan MRN: [id number] Date of Birth: 10/19/1950  Subjective/Objective: Psych-recc SNF w/memory care unit-CSW following diligently for SNF w/memory care.                   Action/Plan:d/c SNF.   Expected Discharge Date:   (unknown)               Expected Discharge Plan:  Skilled Nursing Facility  In-House Referral:  Clinical Social Work  Discharge planning Services  CM Consult  Post Acute Care Choice:    Choice offered to:     DME Arranged:    DME Agency:     HH Arranged:    HH Agency:     Status of Service:  In process, will continue to follow  If discussed at Long Length of Stay Meetings, dates discussed:    Additional Comments:  Lanier ClamMahabir, Zadin Lange, RN 02/10/2017, 12:22 PM

## 2017-02-10 NOTE — Progress Notes (Signed)
CSW assisting with dc planning. No SNF bed offers received yet. CSW will begin calling facilities to request assistance.  Cori RazorJamie Jamarea Selner LCSW [phone]

## 2017-02-10 NOTE — Progress Notes (Signed)
PROGRESS NOTE  Toni Sullivan NGE:[id number] DOB: 03/14/1951 DOA: 02/03/2017 PCP: Excell SeltzerBedsole, Amy E, MD   LOS: 7 days   Brief Narrative / Interim history: 66 y.o.femalewho presented to ED from with worsening mental status changes. Patient husband is at the bedside reported pt has developed more aggressive, confused, combative behavior x 2-3 weeks. Apparently she has as needed ativan but did not receive any recently. She was discharged recently from ALF due to very aggressive behavior. No respiratory distress. No abdominal pain, nausea or vomiting. No fevers or chills. No cough, no chest pain. No falls or lightheadedness or loss of consciousness.  Patient husband is at bedside reported that she has been diagnosed with dementia 2011 and at the same time she had a stroke and followed up by a neurologist who has been prescribing.  Patient was recently started Depakote 125 mg 3 times daily for controlling it agitation which was not helpful. 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   Acute lower UTI   Aggressive behavior   Urinary tract infection without hematuria  Acute metabolic encephalopathy /Dementia in Alzheimer's disease with behavioral disturbance -Likely dementia progresssion; unclear if pt truly had UTI -d/c memantine as this can cause aggressive behavior -continue rivastigmine patch -continue with restraints if need if ativan and haldol PRN -PT consulted-->HHPT at ALF -finished 4 days ceftriaxone IV--little to no improvement in mental status -Patient remained agitated and encephalopathic -consulted psychiatry-->increase depakote and agrees with seroquel -check B12--571 -TSH--2.282 -RPR--neg -discharge with depakote 250 mg tid and seroquel 25 mg bid -Psychiatry cleared patient for a ALF/SNF  discharge, discussed with Child psychotherapistsocial worker, discharge from bed available and she is accepted -still has mittens this morning  Hyperammonemia -Ammonia 75-->recheck = 15 without tx -likely initial spurious result  Pyuria -UA with TNTC WBC -urine culture= multiple organisms -finished 4 days ceftriaxone  Hypokalemia -replaced -Currently within normal limits -check mag--2.0  DVT prophylaxis: SCD Code Status: Full Family Communication: husband bedside Disposition Plan: ALF/SNF when bed available  Consultants:   Psychiatry   Procedures:   None   Antimicrobials:  Ceftriaxone x 4 days   Subjective: -withdrawn, more preoccupied with her mittens than me  Objective: Vitals:   02/09/17 0600 02/09/17 2106 02/10/17 0632 02/10/17 0634  BP: 103/69 (!) 134/98  106/67  Pulse: 83   65  Resp: 16 18    Temp: 97.7 F (36.5 C) 98 F (36.7 C)  97.3 F (36.3 C)  TempSrc: Axillary Axillary  Axillary  SpO2: 100% 100%  100%  Weight:   70.4 kg (155 lb 3.3 oz)   Height:       No intake or output data in the 24 hours ending 02/10/17 1202 Filed Weights   02/07/17 0405 02/09/17 0500 02/10/17 0632  Weight: 68.8 kg (151 lb 10.8 oz) 71.8 kg (158 lb 4.6 oz) 70.4 kg (155 lb 3.3 oz)    Examination:  Vitals:   02/09/17 0600 02/09/17 2106 02/10/17 0632 02/10/17 0634  BP: 103/69 (!) 134/98  106/67  Pulse: 83   65  Resp: 16 18    Temp: 97.7 F (36.5 C) 98 F (36.7 C)  97.3 F (36.3 C)  TempSrc: Axillary Axillary  Axillary  SpO2: 100% 100%  100%  Weight:   70.4 kg (155 lb 3.3 oz)   Height:  Constitutional: NAD CV: RRR   Data Reviewed: I have independently reviewed following labs and imaging studies  CBC:  Recent Labs Lab 02/03/17 1357 02/04/17 0640  WBC 6.9 8.0  NEUTROABS 4.2  --   HGB 12.9 13.1  HCT 38.0 38.3  MCV 83.5 83.3  PLT 210 216   Basic Metabolic Panel:  Recent Labs Lab 02/03/17 1357 02/04/17 0640 02/05/17 0535 02/06/17 0536 02/07/17 0703  NA  143 141 143 142 143  K 3.5 3.2* 4.1 4.3 3.7  CL 111 108 110 108 107  CO2 26 25 26 27 28   GLUCOSE 93 104* 85 90 87  BUN 18 10 8 8 13   CREATININE 0.78 0.55 0.65 0.65 0.73  CALCIUM 8.9 8.6* 9.1 9.2 9.2  MG  --   --   --   --  2.0   GFR: Estimated Creatinine Clearance: 64.8 mL/min (by C-G formula based on SCr of 0.73 mg/dL). Liver Function Tests:  Recent Labs Lab 02/03/17 1357  AST 21  ALT 13*  ALKPHOS 89  BILITOT 0.7  PROT 6.2*  ALBUMIN 3.4*   No results for input(s): LIPASE, AMYLASE in the last 168 hours.  Recent Labs Lab 02/06/17 1504 02/07/17 0703  AMMONIA 75* 15   Coagulation Profile: No results for input(s): INR, PROTIME in the last 168 hours. Cardiac Enzymes: No results for input(s): CKTOTAL, CKMB, CKMBINDEX, TROPONINI in the last 168 hours. BNP (last 3 results) No results for input(s): PROBNP in the last 8760 hours. HbA1C: No results for input(s): HGBA1C in the last 72 hours. CBG:  Recent Labs Lab 02/06/17 2049 02/07/17 0749 02/08/17 0805 02/09/17 0749 02/10/17 0746  GLUCAP 110* 82 162* 70 93   Lipid Profile: No results for input(s): CHOL, HDL, LDLCALC, TRIG, CHOLHDL, LDLDIRECT in the last 72 hours. Thyroid Function Tests: No results for input(s): TSH, T4TOTAL, FREET4, T3FREE, THYROIDAB in the last 72 hours. Anemia Panel: No results for input(s): VITAMINB12, FOLATE, FERRITIN, TIBC, IRON, RETICCTPCT in the last 72 hours. Urine analysis:    Component Value Date/Time   COLORURINE YELLOW 02/03/2017 1357   APPEARANCEUR HAZY (A) 02/03/2017 1357   LABSPEC 1.020 02/03/2017 1357   PHURINE 5.0 02/03/2017 1357   GLUCOSEU NEGATIVE 02/03/2017 1357   HGBUR MODERATE (A) 02/03/2017 1357   BILIRUBINUR NEGATIVE 02/03/2017 1357   KETONESUR 5 (A) 02/03/2017 1357   PROTEINUR 30 (A) 02/03/2017 1357   NITRITE NEGATIVE 02/03/2017 1357   LEUKOCYTESUR LARGE (A) 02/03/2017 1357   Sepsis Labs: Invalid input(s): PROCALCITONIN, LACTICIDVEN  Recent Results (from the  past 240 hour(s))  Culture, Urine     Status: Abnormal   Collection Time: 02/03/17  1:57 PM  Result Value Ref Range Status   Specimen Description URINE, RANDOM  Final   Special Requests NONE  Final   Culture MULTIPLE SPECIES PRESENT, SUGGEST RECOLLECTION (A)  Final   Report Status 02/05/2017 FINAL  Final  Culture, Urine     Status: None   Collection Time: 02/05/17  5:35 PM  Result Value Ref Range Status   Specimen Description URINE, CLEAN CATCH  Final   Special Requests NONE  Final   Culture   Final    NO GROWTH Performed at [facility], [address], [place], [place] [address]    Report Status 02/07/2017 FINAL  Final      Radiology Studies: No results found.   Scheduled Meds: . divalproex  250 mg Oral TID WC  . QUEtiapine  25 mg Oral BID  .  Rivastigmine  13.3 mg Topical Daily   Continuous Infusions:   Pamella Pert, MD, PhD [facility] Pager [phone]  If 7PM-7AM, please contact night-coverage [email or web address] Password [facility] 02/10/2017, 12:02 PM

## 2017-02-11 LAB — GLUCOSE, CAPILLARY: GLUCOSE-CAPILLARY: 88 mg/dL (ref 65–99)

## 2017-02-11 MED ORDER — ENOXAPARIN SODIUM 40 MG/0.4ML ~~LOC~~ SOLN
40.0000 mg | SUBCUTANEOUS | Status: DC
  Administered 2017-02-11 – 2017-02-13 (×3): 40 mg via SUBCUTANEOUS
  Filled 2017-02-11 (×3): qty 0.4

## 2017-02-11 NOTE — Progress Notes (Signed)
PROGRESS NOTE    Toni Sullivan  ZOX:[id number] DOB: 03/20/1951 DOA: 02/03/2017 PCP: Excell SeltzerBedsole, Amy E, MD   Brief Narrative: 66 y.o.femalewho presented to ED from with worsening mental status changes.pt has developed moreaggressive, confused, combative behavior x 2-3 weeks. Apparently she has as needed ativan but did not receive any recently. No other associated systemic symptoms. The husband reported on admission that patient was diagnosed with dementia in 2011and at the same time she had a stroke and followed up by a neurologist.Patient was recently started Depakote 125 mg 3 times daily for controlling  agitation which was not helpful. Patient has been reported patient cannot care for herself  Assessment & Plan:  #Acute metabolic encephalopathy/dementia in Alzheimer disease with behavioral disturbance: -Continue telemetry requiring restraint for safety. This is likely progression of her dementia. Patient treated with antibiotics for UTI without clinical improvement. Memantine is off and continue rivastigmine. -Evaluated by psychiatrist, increased the dose of Depakote, continue Seroquel. -Now awaiting for skilled nursing facility with behavioral unit. Social worker eval is ongoing. -On vitamin B12, TSH, RPR unremarkable. -Try to remove the restraints gradually maintaining safety. Discussed with the nursing staff.  #Hyperammonemia: Improved without treatment.  #Pyuria likely contaminant. Finished 4 days of ceftriaxone.  # Hypokalemia: Improved. Encourage oral intake.  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 Review of systems Limited. Denied pain or any symptoms  Objective: Vitals:   02/10/17 0634 02/10/17 1418 02/11/17 0647 02/11/17 1423  BP: 106/67 112/69 111/72 118/70  Pulse: 65 78 70 72  Resp:  18 18 18   Temp: 97.3 F (36.3 C) 97.9 F (36.6 C) 98.4 F (36.9 C) 98.3 F (36.8 C)  TempSrc: Axillary Axillary Axillary Axillary  SpO2: 100%  97% 98%  Weight:   70.6 kg (155 lb 10.3 oz)   Height:        Intake/Output Summary (Last 24 hours) at 02/11/17 1524 Last data filed at 02/11/17 1422  Gross per 24 hour  Intake              240 ml  Output             1250 ml  Net            -1010 ml   Filed Weights   02/09/17 0500 02/10/17 0632 02/11/17 0647  Weight: 71.8 kg (158 lb 4.6 oz) 70.4 kg (155 lb 3.3 oz) 70.6 kg (155 lb 10.3 oz)    Examination:  General exam: Not in distress Respiratory system: Clear to auscultation. Respiratory effort normal. No wheezing or crackle Cardiovascular system: S1 & S2 heard, RRR.  No pedal edema. Gastrointestinal system: Abdomen is nondistended, soft and nontender. Normal bowel sounds heard.    Data Reviewed: I have personally reviewed following labs and imaging studies  CBC: No results for input(s): WBC, NEUTROABS, HGB, HCT, MCV, PLT in the last 168 hours. Basic Metabolic Panel:  Recent Labs Lab 02/05/17 0535 02/06/17 0536 02/07/17 0703  NA 143 142 143  K 4.1 4.3 3.7  CL 110 108 107  CO2 26 27 28  GLUCOSE 85 90 87  BUN 8 8 13   CREATININE 0.65 0.65 0.73  CALCIUM 9.1 9.2 9.2  MG  --   --  2.0   GFR: Estimated Creatinine Clearance: 64.9 mL/min (by C-G formula based on SCr of 0.73 mg/dL). Liver Function Tests: No results for input(s): AST, ALT, ALKPHOS, BILITOT, PROT, ALBUMIN in the last 168 hours. No results for input(s): LIPASE, AMYLASE in the last 168 hours.  Recent Labs Lab 02/06/17 1504  02/07/17 0703  AMMONIA 75* 15   Coagulation Profile: No results for input(s): INR, PROTIME in the last 168 hours. Cardiac Enzymes: No results for input(s): CKTOTAL, CKMB, CKMBINDEX, TROPONINI in the last 168 hours. BNP (last 3 results) No results for input(s): PROBNP in the last 8760 hours. HbA1C: No results for input(s): HGBA1C in the last 72 hours. CBG:  Recent Labs Lab 02/07/17 0749 02/08/17 0805 02/09/17 0749 02/10/17 0746 02/11/17 0903  GLUCAP 82 162* 70 93 88   Lipid Profile: No results for input(s): CHOL, HDL, LDLCALC, TRIG, CHOLHDL, LDLDIRECT in the last 72 hours. Thyroid Function Tests: No results for input(s): TSH, T4TOTAL, FREET4, T3FREE, THYROIDAB in the last 72 hours. Anemia Panel: No results for input(s): VITAMINB12, FOLATE, FERRITIN, TIBC, IRON, RETICCTPCT in the last 72 hours. Sepsis Labs: No results for input(s): PROCALCITON, LATICACIDVEN in the last 168 hours.  Recent Results (from the past 240 hour(s))  Culture, Urine     Status: Abnormal   Collection Time: 02/03/17  1:57 PM  Result Value Ref Range Status   Specimen Description URINE, RANDOM  Final   Special Requests NONE  Final   Culture MULTIPLE SPECIES PRESENT, SUGGEST RECOLLECTION (A)  Final   Report Status 02/05/2017 FINAL  Final  Culture, Urine     Status: None   Collection Time: 02/05/17  5:35 PM  Result Value Ref Range Status   Specimen Description URINE, CLEAN CATCH  Final   Special Requests NONE  Final   Culture   Final    NO GROWTH Performed at [facility], [address], [place], [place] [address]    Report Status 02/07/2017 FINAL  Final         Radiology Studies: No results found.      Scheduled Meds: . divalproex  250 mg Oral TID WC  . QUEtiapine  25 mg Oral BID  . Rivastigmine  13.3 mg Topical Daily   Continuous Infusions:   LOS: 8 days    Dron Jaynie Collins, MD [facility] Pager [phone]  If 7PM-7AM, please contact  night-coverage [email or web address] Password [email or web address] 02/11/2017, 3:24 PM

## 2017-02-12 ENCOUNTER — Encounter (HOSPITAL_COMMUNITY): Payer: Self-pay

## 2017-02-12 LAB — GLUCOSE, CAPILLARY: Glucose-Capillary: 92 mg/dL (ref 65–99)

## 2017-02-12 NOTE — Progress Notes (Signed)
PROGRESS NOTE    Toni Sullivan  ZOX:[id number] DOB: 04-24-1951 DOA: 02/03/2017 PCP: Excell Seltzer, MD   Brief Narrative: 66 y.o.femalewho presented to ED from with worsening mental status changes.pt has developed moreaggressive, confused, combative behavior x 2-3 weeks. Apparently she has as needed ativan but did not receive any recently. No other associated systemic symptoms. The husband reported on admission that patient was diagnosed with dementia in 2011and at the same time she had a stroke and followed up by a neurologist.Patient was recently started Depakote 125 mg 3 times daily for controlling  agitation which was not helpful. Patient has been reported patient cannot care for herself  Assessment & Plan:  #Acute metabolic encephalopathy/dementia in Alzheimer disease with behavioral disturbance: -Continue telemetry requiring restraint for safety. This is likely progression of her dementia. Patient treated with antibiotics for UTI without clinical improvement. Memantine is off and continue rivastigmine. -Evaluated by psychiatrist, increased the dose of Depakote, continue Seroquel. -Now awaiting for skilled nursing facility with behavioral unit. Social worker eval is ongoing. -On vitamin B12, TSH, RPR unremarkable. -No change in mental status. We will try to remove the restraints today. She may need close observation and GERD and bed sitter. Discussed with the nursing staff.  #Hyperammonemia: Improved without treatment.  #Pyuria likely contaminant. Finished 4 days of ceftriaxone.  # Hypokalemia: Improved. Encourage oral intake.  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 Review of systems Limited. Denies pain or any complaint.  Objective: Vitals:   02/11/17 0647 02/11/17 1423 02/11/17 2043 02/12/17 0621  BP: 111/72 118/70 121/87 106/87  Pulse: 70 72 94 75  Resp: 18 18 20 18   Temp: 98.4 F (36.9 C) 98.3 F (36.8 C) 98.3 F (36.8 C) 97.8 F (36.6 C)  TempSrc: Axillary Axillary Axillary Axillary  SpO2: 97% 98% 94% 100%  Weight: 70.6 kg (155 lb 10.3 oz)   70.3 kg (154 lb 15.7 oz)  Height:        Intake/Output Summary (Last 24 hours) at 02/12/17 1047 Last data filed at 02/12/17 0400  Gross per 24 hour  Intake              420 ml  Output             1200 ml  Net             -780 ml   Filed Weights   02/10/17 0632 02/11/17 0647 02/12/17 0621  Weight: 70.4 kg (155 lb 3.3 oz) 70.6 kg (155 lb 10.3 oz) 70.3 kg (154 lb 15.7 oz)    Examination:  General exam: Not in distress Respiratory system: Clear bilateral. Respiratory effort normal.  Cardiovascular system: S1 & S2 heard, RRR.  No pedal edema. Gastrointestinal system: Abdomen is  soft and nontender. Normal bowel sounds heard. Neurologic: Alert awake but not oriented.   Data Reviewed: I have personally reviewed following labs and imaging studies  CBC: No results for input(s): WBC, NEUTROABS, HGB, HCT, MCV, PLT in the last 168 hours. Basic Metabolic Panel:  Recent Labs Lab 02/06/17 0536 02/07/17 0703  NA 142 143  K 4.3 3.7  CL 108 107  CO2 27 28  GLUCOSE 90 87  BUN 8 13  CREATININE 0.65 0.73  CALCIUM 9.2 9.2  MG  --  2.0   GFR: Estimated Creatinine Clearance: 64.7 mL/min (by C-G formula based on SCr of 0.73 mg/dL). Liver Function Tests: No results for input(s): AST, ALT, ALKPHOS, BILITOT, PROT, ALBUMIN in the last 168 hours. No results for input(s): LIPASE, AMYLASE in the last 168  hours.  Recent Labs Lab 02/06/17 1504 02/07/17 0703  AMMONIA 75* 15   Coagulation Profile: No results for input(s): INR, PROTIME in the last 168 hours. Cardiac Enzymes: No results for input(s): CKTOTAL, CKMB, CKMBINDEX, TROPONINI in the last 168 hours. BNP (last 3 results) No results for input(s): PROBNP in the last 8760 hours. HbA1C: No results for input(s): HGBA1C in the last 72 hours. CBG:  Recent Labs Lab 02/08/17 0805 02/09/17 0749 02/10/17 0746 02/11/17 0903 02/12/17 0721  GLUCAP 162* 70 93 88 92   Lipid Profile: No results for input(s): CHOL, HDL, LDLCALC, TRIG, CHOLHDL, LDLDIRECT in the last 72 hours. Thyroid Function Tests: No results for input(s): TSH, T4TOTAL, FREET4, T3FREE, THYROIDAB in the last 72 hours. Anemia Panel: No results for input(s): VITAMINB12, FOLATE, FERRITIN, TIBC, IRON, RETICCTPCT in the last 72 hours. Sepsis Labs: No results for input(s): PROCALCITON, LATICACIDVEN in the last 168 hours.  Recent Results (from the past 240 hour(s))  Culture, Urine     Status: Abnormal   Collection Time: 02/03/17  1:57 PM  Result Value Ref Range Status   Specimen Description URINE, RANDOM  Final   Special Requests NONE  Final   Culture MULTIPLE SPECIES PRESENT, SUGGEST RECOLLECTION (A)  Final   Report Status 02/05/2017 FINAL  Final  Culture, Urine     Status: None   Collection Time: 02/05/17  5:35 PM  Result Value Ref Range Status   Specimen Description URINE, CLEAN CATCH  Final   Special Requests NONE  Final   Culture   Final    NO GROWTH Performed at [facility], [address] [place], [place] [address]    Report Status 02/07/2017 FINAL  Final         Radiology Studies: No results found.      Scheduled Meds: . divalproex  250 mg Oral TID WC  . enoxaparin (LOVENOX) injection  40 mg Subcutaneous Q24H  . QUEtiapine  25 mg Oral BID  . Rivastigmine  13.3 mg Topical Daily   Continuous Infusions:   LOS: 9 days    Raymond Azure Jaynie CollinsPrasad  Hawa Henly, MD [facility] Pager [phone]  If 7PM-7AM, please contact night-coverage [email or web address] Password [email or web address] 02/12/2017, 10:47 AM

## 2017-02-12 NOTE — Progress Notes (Signed)
Attempted out of wrist  Restraints and immediately started to be aggressive, hitting attempting out of bed.  Have taken off ankle restraints.  But wrist and belt remain.

## 2017-02-12 NOTE — Progress Notes (Signed)
Attempted to leave patient out of restraints after walking in hall with PT and husband. Successful for approximately 20 minutes. Husband at bedside. Patient became agitated while using bathroom and would not follow directions, difficult to get her back into bed or chair. Patient is a fall risk and not following cues and making fists/agitated behavior. Discussed safety plan with husband and husband agrees that restraints are the best option due to agitation and not following directions.  Earnest ConroyBrooke M. Clelia CroftShaw, RN

## 2017-02-12 NOTE — Progress Notes (Signed)
Physical Therapy Treatment Patient Details Name: Toni Sullivan MRN: [id number] DOB: Mar 04, 1951 Today's Date: 02/12/2017    History of Present Illness 66 yo female admitted with UTI, acute metabolic encephalopathy, agitation, aggression. Hx of Alz, CVA, osteopenia.     PT Comments    Pt initially refused to walk, but then when her husband entered room and encouraged her to walk, she was agreeable. Pt ambulated 800' with hand held assistance of 1-2 people, mild loss of balance x 2 at beginning of walk, then steady. She tolerated significant increase in ambulation distance today.    Follow Up Recommendations  Supervision/Assistance - 24 hour;SNF (per husband, plan is to DC to SNF as ALF cannot manage pt)     Equipment Recommendations  None recommended by PT    Recommendations for Other Services       Precautions / Restrictions Precautions Precautions: Fall Precaution Comments: she is unsteady on her feet, husband reports only fall in past year was on ice Restrictions Weight Bearing Restrictions: No    Mobility  Bed Mobility Overal bed mobility: Needs Assistance Bed Mobility: Supine to Sit     Supine to sit: HOB elevated;Min assist     General bed mobility comments: min A to raise trunk  Transfers Overall transfer level: Needs assistance Equipment used: 2 person hand held assist Transfers: Sit to/from Stand Sit to Stand: Min assist         General transfer comment: min A to rise and stabilize  Ambulation/Gait Ambulation/Gait assistance: +2 physical assistance;Mod assist;Min assist Ambulation Distance (Feet): 800 Feet Assistive device: 2 person hand held assist;1 person hand held assist Gait Pattern/deviations: Step-through pattern;Staggering left;Staggering right Gait velocity: decreased, pt walking slower than normal per husband   General Gait Details: initially HHA of 2, then for last 300' HHA of 1, pt had mild LOB x 2 at beginning of walk requiring min  A   Stairs            Wheelchair Mobility    Modified Rankin (Stroke Patients Only)       Balance     Sitting balance-Leahy Scale: Good       Standing balance-Leahy Scale: Fair                              Cognition Arousal/Alertness: Awake/alert (at the beginning, better at the end) Behavior During Therapy: Flat affect (easily agitated) Overall Cognitive Status: History of cognitive impairments - at baseline (worse than baseline) Area of Impairment: Orientation;Attention;Memory;Following commands;Safety/judgement;Awareness;Problem solving                 Orientation Level: Disoriented to;Place;Time;Situation Current Attention Level: Sustained Memory: Decreased recall of precautions;Decreased short-term memory Following Commands: Follows one step commands inconsistently Safety/Judgement: Decreased awareness of safety;Decreased awareness of deficits Awareness: Intellectual Problem Solving: Slow processing;Decreased initiation;Difficulty sequencing;Requires verbal cues;Requires tactile cues General Comments: pt initially refused to ambulate despite multiple attempts at encouragement to walk by PT and RN, husband then came in room and pt agreed to walk      Exercises      General Comments        Pertinent Vitals/Pain Pain Assessment: No/denies pain    Home Living                      Prior Function            PT Goals (current goals can now be found in  the care plan section) Acute Rehab PT Goals Patient Stated Goal: DC to SNF PT Goal Formulation: With patient/family Time For Goal Achievement: 02/20/17 Potential to Achieve Goals: Fair Progress towards PT goals: Progressing toward goals    Frequency    Min 3X/week      PT Plan Current plan remains appropriate    Co-evaluation              AM-PAC PT "6 Clicks" Daily Activity  Outcome Measure  Difficulty turning over in bed (including adjusting bedclothes,  sheets and blankets)?: A Lot Difficulty moving from lying on back to sitting on the side of the bed? : Total Difficulty sitting down on and standing up from a chair with arms (e.g., wheelchair, bedside commode, etc,.)?: Total Help needed moving to and from a bed to chair (including a wheelchair)?: A Little Help needed walking in hospital room?: A Little Help needed climbing 3-5 steps with a railing? : Total 6 Click Score: 11    End of Session Equipment Utilized During Treatment: Gait belt Activity Tolerance: Patient tolerated treatment well Patient left: with call bell/phone within reach;with family/visitor present;in bed;with nursing/sitter in room (asked family member to let RN know if he leaves) Nurse Communication: Mobility status PT Visit Diagnosis: Muscle weakness (generalized) (M62.81);Difficulty in walking, not elsewhere classified (R26.2)     Time: [id number] PT Time Calculation (min) (ACUTE ONLY): 48 min  Charges:  $Gait Training: 23-37 mins $Therapeutic Activity: 8-22 mins                    G Codes:         [name], Jermiyah Ricotta Kistler 02/12/2017, 1:02 PM [phone]

## 2017-02-13 LAB — GLUCOSE, CAPILLARY: Glucose-Capillary: 88 mg/dL (ref 65–99)

## 2017-02-13 NOTE — Progress Notes (Addendum)
Patient's MD and RN aware that patient needs to be restraint free for 24 hours prior to going to a facility. Patient's RN reported that patient has been restraint free since 9:30am. CSW contacted [facility] to inquire about their ability to offer patient a bed. CSW spoke with staff member Chrys Racer who agreed to review patient's information. Staff member Tammy agreed to come meet with patient. Patient's husband aware and agreeable, CSW will continue to follow and assist with discharge planning SNF memory care unit.    1:45 PM Staff member Tammy reported that she met with patient and that the facility wanted to see how the patient did without restraints for 24 hours and agreed to come see patient tomorrow around lunch time. CSW will follow up with staff after she meets with patient tomorrow regarding possible SNF placement.     Abundio Miu, [place] Social Worker [facility] Cell#: [phone]

## 2017-02-13 NOTE — Progress Notes (Signed)
Pt continues to do ok out of restraints with husband at bedside.  Will continue to monitor closely. Toni Sullivan, Toni Sullivan B

## 2017-02-13 NOTE — Progress Notes (Signed)
Chaplain visited patient per nurse request.  Thank you for referring [name] to me.  Lovely visit with her and her spouse who is at bedside.  Spouse says they are awaiting bed placement for skilled nursing facility which will hopefully provide some support for him.  Chaplain provided space for conversation and ministry of presence.      02/13/17 1300  Clinical Encounter Type  Visited With Patient and family together  Visit Type Initial;Psychological support;Spiritual support;Social support  Referral From Nurse  Consult/Referral To Chaplain  Spiritual Encounters  Spiritual Needs Emotional  Stress Factors  Patient Stress Factors Health changes  Family Stress Factors Health changes

## 2017-02-13 NOTE — Progress Notes (Signed)
PT Note  Patient Details Name: Toni LibraBonnie G Merrick MRN: [id number] DOB: 06/08/1951     Reason Eval/Treat Not Completed: Other (comment) RN reports attempts to keep pt off restraints.  Pt has been ambulating and plans for d/c to memory care unit per RN.  Will defer PT today.   Raevin Wierenga,KATHrine E 02/13/2017, 10:30 AM Zenovia JarredKati Gamaliel Charney, PT, DPT 02/13/2017 Pager: [phone]

## 2017-02-13 NOTE — Progress Notes (Signed)
[facility] PROGRESS NOTE  Toni Sullivan OZH:[id number] DOB: 02/05/1951 DOA: 02/03/2017 PCP: Excell SeltzerBedsole, Amy E, MD  Brief Narrative: 66 y.o.femalewho presented to ED from with worsening mental status changes.pt has developed moreaggressive, confused, combative behavior x 2-3 weeks. Apparently she has as needed ativan but did not receive any recently. No other associated systemic symptoms. The husband reported on admission that patient was diagnosed with dementia in 2011and at the same time she had a stroke and followed up by a neurologist.Patient was recently started Depakote 125 mg 3 times daily for controlling  agitation which was not helpful. Patient has been reported patient cannot care for herself  Assessment & Plan:  Acute metabolic encephalopathy/dementia in Alzheimer disease with behavioral disturbance: -Continue requiring restraint for safety. This is likely progression of her dementia. Patient treated with antibiotics for UTI without clinical improvement. Memantine is off and continue rivastigmine. vitamin B12, TSH, RPR unremarkable. -Evaluated by psychiatrist, increased the dose of Depakote, continue Seroquel. -Now awaiting for skilled nursing facility with behavioral unit. Social worker eval is ongoing.   Hyperammonemia: Improved without treatment. Pyuria likely contaminant. Finished 4 days of ceftriaxone. Hypokalemia: Improved. Encourage oral intake.  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 No distress. D/w her husband at the bedside as well  Objective: Vitals:   02/12/17 2058 02/13/17 0623  BP: (!) 145/86 109/81  Pulse: 82 74  Resp: 18 18  Temp: 98.2 F (36.8 C) 97.7 F (36.5 C)    Intake/Output Summary (Last 24 hours) at 02/13/17 1106 Last data filed at 02/13/17 0600  Gross per 24 hour  Intake              720 ml  Output                0 ml  Net              720 ml   Filed Weights   02/11/17 0647 02/12/17 0621 02/13/17 0646  Weight: 70.6 kg (155 lb 10.3 oz) 70.3 kg (154 lb 15.7 oz) 70.2 kg (154 lb 12.2 oz)    Exam:   General:  No distress   Cardiovascular: s1,s2 rrr  Respiratory: CTA BL  Abdomen: soft, nt, nd   Musculoskeletal: no leg edema    Data Reviewed: Basic Metabolic Panel:  Recent Labs Lab 02/07/17 0703  NA 143  K 3.7  CL 107  CO2 28  GLUCOSE 87  BUN 13  CREATININE 0.73  CALCIUM 9.2  MG 2.0   Liver Function Tests: No results for input(s): AST, ALT, ALKPHOS, BILITOT, PROT, ALBUMIN in the last 168 hours. No results for input(s): LIPASE, AMYLASE in the last 168 hours.  Recent Labs Lab 02/06/17 1504 02/07/17 0703  AMMONIA 75* 15   CBC: No results for input(s): WBC, NEUTROABS, HGB, HCT, MCV, PLT in the last 168 hours. Cardiac Enzymes: No results for input(s): CKTOTAL, CKMB, CKMBINDEX, TROPONINI in the last 168 hours. BNP (last 3 results) No results for input(s): BNP in the last 8760 hours.  ProBNP (last 3 results)  No results for input(s): PROBNP in the last 8760 hours.  CBG:  Recent Labs Lab 02/09/17 0749 02/10/17 0746 02/11/17 0903 02/12/17 0721 02/13/17 0852  GLUCAP 70 93 88 92 88    Recent Results (from the past 240 hour(s))  Culture, Urine     Status: Abnormal   Collection Time: 02/03/17  1:57 PM  Result Value Ref Range Status   Specimen Description URINE, RANDOM  Final   Special Requests NONE  Final   Culture MULTIPLE SPECIES PRESENT, SUGGEST RECOLLECTION  (A)  Final   Report Status 02/05/2017 FINAL  Final  Culture, Urine     Status: None   Collection Time: 02/05/17  5:35 PM  Result Value Ref Range Status   Specimen Description URINE, CLEAN CATCH  Final   Special Requests NONE  Final   Culture   Final    NO GROWTH Performed at [facility], [address] [place], [place] [address]    Report Status 02/07/2017 FINAL  Final     Studies: No results found.  Scheduled Meds: . divalproex  250 mg Oral TID WC  . enoxaparin (LOVENOX) injection  40 mg Subcutaneous Q24H  . QUEtiapine  25 mg Oral BID  . 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 If 7PM-7AM, please contact night-coverage at [email or web address], password [facility] 02/13/2017, 11:06 AM  LOS: 10 days

## 2017-02-13 NOTE — Progress Notes (Signed)
Received report from Betsy LayneMelissa, Charity fundraiserN. Agree with previous RN assessment. Patient currently resting. Restraint applied appropriately. Will continue to monitor patient closely.

## 2017-02-13 NOTE — Progress Notes (Signed)
Pt's spouse at bedside.  Attempting trial out of restraints and mittens. Toni Sullivan, Toni Sullivan

## 2017-02-13 NOTE — Progress Notes (Signed)
Nutrition Brief Note  Patient identified for LOS (day 10)  Wt Readings from Last 15 Encounters:  02/13/17 154 lb 12.2 oz (70.2 kg)  09/01/16 164 lb 12.8 oz (74.8 kg)  02/18/16 167 lb 9.6 oz (76 kg)  09/08/15 168 lb 12.8 oz (76.6 kg)  06/16/15 162 lb 6.4 oz (73.7 kg)  09/02/14 141 lb (64 kg)  01/07/14 124 lb (56.2 kg)  11/20/13 130 lb (59 kg)  07/03/13 133 lb (60.3 kg)  07/27/12 134 lb 8 oz (61 kg)  08/27/10 143 lb 8 oz (65.1 kg)  04/22/09 144 lb 2.1 oz (65.4 kg)  01/30/09 146 lb (66.2 kg)  01/15/09 146 lb 6.1 oz (66.4 kg)    Body mass index is 26.57 kg/m. Patient meets criteria for overweight based on current BMI. Pt has lost 10 lbs (6.1% body weight) in the past 5 months; this is not significant for time frame. Skin WDL. Pt admitted for acute metabolic encephalopathy in the setting of acute lower UTI with hx of early onset Alzheimer's disease with behavioral disturbances.  Current diet order is Regular, patient has been consuming approximately 50-100% of meals since admission. Labs and medications reviewed. No nutrition interventions warranted at this time. If nutrition issues arise, please consult RD.     Trenton GammonJessica Webber Michiels, MS, RD, LDN, [facility] Inpatient Clinical Dietitian Pager # [phone] After hours/weekend pager # [phone]

## 2017-02-14 LAB — GLUCOSE, CAPILLARY: GLUCOSE-CAPILLARY: 164 mg/dL — AB (ref 65–99)

## 2017-02-14 NOTE — Progress Notes (Signed)
[facility],CSW faxed requested clinical documents. CSW will follow up with SNF tomorrow.   Celso SickleKimberly Corretta Munce, [place] Clinical Social Worker [facility] Cell#: [phone]

## 2017-02-14 NOTE — Progress Notes (Signed)
PROGRESS NOTE    Toni Sullivan  JXB:[id number] DOB: 01/26/1951 DOA: 02/03/2017 PCP: Excell SeltzerBedsole, Amy E, MD   Brief Narrative: 66 y.o.femalewho presented to ED from with worsening mental status changes.pt has developed moreaggressive, confused, combative behavior x 2-3 weeks. Apparently she has as needed ativan but did not receive any recently. No other associated systemic symptoms. The husband reported on admission that patient was diagnosed with dementia in 2011and at the same time she had a stroke and followed up by a neurologist.Patient was recently started Depakote 125 mg 3 times daily for controlling  agitation which was not helpful. Patient has been reported patient cannot care for herself  Assessment & Plan:  #Acute metabolic encephalopathy/dementia in Alzheimer disease with behavioral disturbance: -pt is off restraint for >24 hours. Husband at bedside. Discharge planning discussed facility ongoing. -This is likely progression of her dementia. Patient treated with antibiotics for UTI without clinical improvement. Memantine is off and continue rivastigmine. -Evaluated by psychiatrist, increased the dose of Depakote, continue Seroquel. -Now awaiting for skilled nursing facility with behavioral unit. Social worker eval is ongoing. -On vitamin B12, TSH, RPR unremarkable.  #Hyperammonemia: Improved without treatment.  #Pyuria likely contaminant. Finished 4 days of ceftriaxone.  # Hypokalemia: Improved. Encourage oral intake.  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 Pleasant today. Denies headache, dizziness chest pain or shortness of breath. Husband at bedside.  Objective: Vitals:   02/13/17 0646 02/13/17 1319 02/13/17 2113 02/14/17 0643  BP:  107/90 103/70 93/61  Pulse:  (!) 101 80 70  Resp:  18 16 17   Temp:  97.7 F (36.5 C) 98.2 F (36.8 C) 97.8 F (36.6 C)  TempSrc:  Axillary Axillary Axillary  SpO2:  97% 99% 98%  Weight: 70.2 kg (154 lb 12.2 oz)   69.1 kg (152 lb 5.4 oz)  Height:        Intake/Output Summary (Last 24 hours) at 02/14/17 1352 Last data filed at 02/14/17 0600  Gross per 24 hour  Intake              260 ml  Output                0 ml  Net              260 ml   Filed Weights   02/12/17 0621 02/13/17 0646 02/14/17 0643  Weight: 70.3 kg (154 lb 15.7 oz) 70.2 kg (154 lb 12.2 oz) 69.1 kg (152 lb 5.4 oz)    Examination:  General exam: Sitting on bed comfortable, not in distress, Respiratory system: Clear bilateral. Respiratory effort normal.  Cardiovascular system: S1 & S2 heard, RRR.  No pedal edema. Gastrointestinal system: Abdomen is  soft and nontender. Normal bowel sounds heard. Neurologic: Alert awake and comfortable.   Data Reviewed: I have personally reviewed following labs and imaging studies  CBC: No results for input(s): WBC, NEUTROABS, HGB, HCT, MCV, PLT in the last 168 hours. Basic Metabolic Panel: No results for input(s): NA, K, CL, CO2, GLUCOSE, BUN, CREATININE,  CALCIUM, MG, PHOS in the last 168 hours. GFR: Estimated Creatinine Clearance: 64.3 mL/min (by C-G formula based on SCr of 0.73 mg/dL). Liver Function Tests: No results for input(s): AST, ALT, ALKPHOS, BILITOT, PROT, ALBUMIN in the last 168 hours. No results for input(s): LIPASE, AMYLASE in the last 168 hours. No results for input(s): AMMONIA in the last 168 hours. Coagulation Profile: No results for input(s): INR, PROTIME in the last  168 hours. Cardiac Enzymes: No results for input(s): CKTOTAL, CKMB, CKMBINDEX, TROPONINI in the last 168 hours. BNP (last 3 results) No results for input(s): PROBNP in the last 8760 hours. HbA1C: No results for input(s): HGBA1C in the last 72 hours. CBG:  Recent Labs Lab 02/10/17 0746 02/11/17 0903 02/12/17 0721 02/13/17 0852 02/14/17 0916  GLUCAP 93 88 92 88 164*   Lipid Profile: No results for input(s): CHOL, HDL, LDLCALC, TRIG, CHOLHDL, LDLDIRECT in the last 72 hours. Thyroid Function Tests: No results for input(s): TSH, T4TOTAL, FREET4, T3FREE, THYROIDAB in the last 72 hours. Anemia Panel: No results for input(s): VITAMINB12, FOLATE, FERRITIN, TIBC, IRON, RETICCTPCT in the last 72 hours. Sepsis Labs: No results for input(s): PROCALCITON, LATICACIDVEN in the last 168 hours.  Recent Results (from the past 240 hour(s))  Culture, Urine     Status: None   Collection Time: 02/05/17  5:35 PM  Result Value Ref Range Status   Specimen Description URINE, CLEAN CATCH  Final   Special Requests NONE  Final   Culture   Final    NO GROWTH Performed at [facility], [address] [place], [place] [address]    Report Status 02/07/2017 FINAL  Final         Radiology Studies: No results found.      Scheduled Meds: . divalproex  250 mg Oral TID WC  . enoxaparin (LOVENOX) injection  40 mg Subcutaneous Q24H  . QUEtiapine  25 mg Oral BID  . Rivastigmine  13.3 mg Topical Daily   Continuous Infusions:   LOS: 11 days    Jocelin Schuelke Jaynie Collins, MD [facility] Pager [phone]  If 7PM-7AM, please contact night-coverage [email or web address] Password [email or web address] 02/14/2017, 1:52 PM

## 2017-02-14 NOTE — Care Management Important Message (Signed)
Important Message  Patient Details  Name: Toni Sullivan MRN: [id number] Date of Birth: 05/05/1951   Medicare Important Message Given:  Yes    Caren MacadamFuller, Harvest Deist 02/14/2017, 11:19 AMImportant Message  Patient Details  Name: Toni Sullivan MRN: [id number] Date of Birth: 05/02/1951   Medicare Important Message Given:  Yes    Caren MacadamFuller, Haillee Johann 02/14/2017, 11:19 AM

## 2017-02-14 NOTE — Progress Notes (Signed)
Per nightshift RN, patient had a good night. Husband was able to stay with patient and patient didn't need any restraints. As of now she's been out of restraints for over 24 hours. She took her medicine this morning for me and ate breakfast with her husband.  Rockne Coonsorcoran, Edgel Degnan C, RN 10:25 AM 02/14/17

## 2017-02-14 NOTE — Progress Notes (Signed)
CSW started insurance authorization with [employer] for patient.   CSW contacted and informed by staff member Tammy that she saw patient and was waiting to hear back from Publishing copynursing director [facility]. Staff reported that patient's husband spending the night may be considered a sitter by the facility and inquired how patient would do without husband being present. CSW agreed to check with staff.   CSW followed up with patient's husband about meeting [facility]. Patient's husband reported that he felt the meeting went well. CSW informed patient's husband about sitter concerns and that CSW was waiting to hear back from facility.   CSW contacted by [employer] and informed that patient's insurance authorization for ST rehab at SNF was denied.  CSW informed patient's husband that insurance authorization was denied. Patient's husband reported that he has a [employer] term care plan where he pays the SNF upfront and is reimbursed. Patient's husband reported that he is able to hire a Comptrollersitter. CSW provided patient's husband with private duty car agencies list and agreed to provide him with an update once CSW hears from SNF.   CSW followed up with staff member Rayfield CitizenCaroline [facility] regarding their ability to offer patient a bed. CSW informed staff about patient's insurance authorization denial and patient's [employer] term care insurance plan. CSW also informed staff about patient's husband's agreement to hire a sitter. She reported that she was waiting to hear back from her Engineer, materialsadministrator and director. She agreed to contact CSW with an update.   CSW will continue to follow and assist with discharge planning to SNF.

## 2017-02-15 LAB — GLUCOSE, CAPILLARY: Glucose-Capillary: 101 mg/dL — ABNORMAL HIGH (ref 65–99)

## 2017-02-15 NOTE — Progress Notes (Signed)
PROGRESS NOTE    Toni Sullivan  ZOX:[id number] DOB: December 26, 1950 DOA: 02/03/2017 PCP: Excell Seltzer, MD   Brief Narrative: 66 y.o.femalewho presented to ED from with worsening mental status changes.pt has developed moreaggressive, confused, combative behavior x 2-3 weeks. Apparently she has as needed ativan but did not receive any recently. No other associated systemic symptoms. The husband reported on admission that patient was diagnosed with dementia in 2011and at the same time she had a stroke and followed up by a neurologist.Patient was recently started Depakote 125 mg 3 times daily for controlling  agitation which was not helpful. Patient has been reported patient cannot care for herself.  Patient was calm and comfortable this morning. Discussed with the social worker and care management team. Waiting for bed at a skilled nursing facility.  Assessment & Plan:  #Acute metabolic encephalopathy/dementia in Alzheimer disease with behavioral disturbance: -pt is off restraint for >24 hours. Husband at bedside. Discharge planning discussed facility ongoing. -This is likely progression of her dementia. Patient treated with antibiotics for UTI without clinical improvement. Memantine is off and continue rivastigmine. -Evaluated by psychiatrist, increased the dose of Depakote, continue Seroquel. -Now awaiting for skilled nursing facility with behavioral unit. Social worker eval is ongoing. -On vitamin B12, TSH, RPR unremarkable.  #Hyperammonemia: Improved without treatment.  #Pyuria likely contaminant. Finished 4 days of ceftriaxone.  # Hypokalemia: Improved. Encourage oral intake.  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 Patient is pleasant. Denies any needs.  Objective: Vitals:   02/14/17 1634 02/14/17 2103 02/15/17 0500 02/15/17 0629  BP: 126/79 (!) 127/91  98/69  Pulse: 91 81  65  Resp: 18 16  16   Temp:  97.3 F (36.3 C)  97.2 F (36.2 C)  TempSrc:  Axillary  Axillary  SpO2: 98% 98%  97%  Weight:   69 kg (152 lb 1.9 oz)   Height:        Intake/Output Summary (Last 24 hours) at 02/15/17 1049 Last data filed at 02/15/17 0600  Gross per 24 hour  Intake               60 ml  Output                0 ml  Net               60 ml   Filed Weights   02/13/17 0646 02/14/17 0643 02/15/17 0500  Weight: 70.2 kg (154 lb 12.2 oz) 69.1 kg (152 lb 5.4 oz) 69 kg (152 lb 1.9 oz)    Examination:  General exam: Alert awake and comfortable, sitting on bed Respiratory system: Clear bilateral. Respiratory effort normal.  Cardiovascular system: S1 & S2 heard, RRR.  No pedal edema. Gastrointestinal system: Abdomen is  soft and nontender. Normal bowel sounds heard. Neurologic: Alert awake and comfortable.   Data Reviewed: I have personally reviewed following labs and imaging studies  CBC: No results for input(s): WBC, NEUTROABS, HGB, HCT, MCV, PLT in the last 168 hours. Basic Metabolic Panel:  No results for input(s): NA, K, CL, CO2, GLUCOSE, BUN, CREATININE, CALCIUM, MG, PHOS in the last 168 hours. GFR: Estimated Creatinine Clearance: 64.2 mL/min (by C-G formula based on SCr of 0.73 mg/dL). Liver Function Tests: No results for input(s): AST, ALT, ALKPHOS, BILITOT, PROT, ALBUMIN in the last 168 hours. No results for input(s): LIPASE, AMYLASE in the last 168 hours. No results for input(s): AMMONIA in the last 168 hours. Coagulation Profile: No  results for input(s): INR, PROTIME in the last 168 hours. Cardiac Enzymes: No results for input(s): CKTOTAL, CKMB, CKMBINDEX, TROPONINI in the last 168 hours. BNP (last 3 results) No results for input(s): PROBNP in the last 8760 hours. HbA1C: No results for input(s): HGBA1C in the last 72 hours. CBG:  Recent Labs Lab 02/11/17 0903 02/12/17 0721 02/13/17 0852 02/14/17 0916 02/15/17 0754  GLUCAP 88 92 88 164* 101*   Lipid Profile: No results for input(s): CHOL, HDL, LDLCALC, TRIG, CHOLHDL, LDLDIRECT in the last 72 hours. Thyroid Function Tests: No results for input(s): TSH, T4TOTAL, FREET4, T3FREE, THYROIDAB in the last 72 hours. Anemia Panel: No results for input(s): VITAMINB12, FOLATE, FERRITIN, TIBC, IRON, RETICCTPCT in the last 72 hours. Sepsis Labs: No results for input(s): PROCALCITON, LATICACIDVEN in the last 168 hours.  Recent Results (from the past 240 hour(s))  Culture, Urine     Status: None   Collection Time: 02/05/17  5:35 PM  Result Value Ref Range Status   Specimen Description URINE, CLEAN CATCH  Final   Special Requests NONE  Final   Culture   Final    NO GROWTH Performed at [facility], [address] [place], [place] [address]    Report Status 02/07/2017 FINAL  Final         Radiology Studies: No results found.      Scheduled Meds: . divalproex  250 mg Oral TID WC  . enoxaparin (LOVENOX) injection  40 mg Subcutaneous Q24H  . QUEtiapine  25 mg Oral BID  . Rivastigmine  13.3 mg Topical Daily   Continuous Infusions:   LOS: 12 days    Justina Bertini Jaynie CollinsPrasad Sherri Mcarthy, MD [facility] Pager [phone]  If 7PM-7AM, please contact night-coverage [email or web address] Password [email or web address] 02/15/2017, 10:49 AM

## 2017-02-15 NOTE — Progress Notes (Signed)
Per nightshift RN, patient had a great night. No PRN medications were needed. RN said that patient was very calm and cooperative with staff. RN reported that the patient slept all night. Husband of patient left yesterday 02/14/2017 around 4:00 pm and has not returned to hospital since.  Rockne Coonsorcoran, Jillianne Gamino C, RN 8:15 AM 02/15/17

## 2017-02-15 NOTE — Progress Notes (Signed)
CSW contacted and provided update to staff from SNF about patient's night. Staff reported that they are still waiting to hear from the director of nursing regarding ability to offer bed.   CSW updated patient's husband. Patient's husband reported that he is willing to stay at SNF with patient until the sitter can start, CSW will notify staff from SNF.   Celso SickleKimberly Jiaire [place], [place] Clinical Social Worker [facility] Cell#: [phone]

## 2017-02-15 NOTE — Progress Notes (Signed)
Chaplain stopped in to visit with patient and patient's spouse for a follow-up visit.  Spouse states that they are still awaiting a bed placement.  Family appreciative of Chaplain support.  Chaplain provided support and encouragement for this spouse who is watching his wife change due to health reasons.  Appreciative of Chaplain presence.    02/15/17 1207  Clinical Encounter Type  Visited With Patient and family together  Visit Type Follow-up  Referral From Chaplain  Consult/Referral To Chaplain  Stress Factors  Patient Stress Factors Health changes  Family Stress Factors Health changes

## 2017-02-15 NOTE — Progress Notes (Signed)
CSW contacted by [facility] and notified that if patient has another good night that the facility should be able to take patient tomorrow. Patient also under [facility], [facility] and [facility]. CSW provided update to Research scientist (physical sciences)Assistant CSW Director. CSW provided update to patient's husband and informed him that if placement is not able to be found patient that they will have to pursue placement from the community, patient's husband verbalized understanding. CSW will continue to follow and assist with placement at a SNF with a memory care unit.   Celso SickleKimberly Aalaysia Liggins, [place] Clinical Social Worker [facility] Cell#: [phone]

## 2017-02-16 DIAGNOSIS — R54 Age-related physical debility: Secondary | ICD-10-CM | POA: Diagnosis not present

## 2017-02-16 DIAGNOSIS — G9340 Encephalopathy, unspecified: Secondary | ICD-10-CM | POA: Diagnosis not present

## 2017-02-16 DIAGNOSIS — E7220 Disorder of urea cycle metabolism, unspecified: Secondary | ICD-10-CM | POA: Diagnosis not present

## 2017-02-16 DIAGNOSIS — N390 Urinary tract infection, site not specified: Secondary | ICD-10-CM | POA: Diagnosis not present

## 2017-02-16 DIAGNOSIS — I639 Cerebral infarction, unspecified: Secondary | ICD-10-CM | POA: Diagnosis not present

## 2017-02-16 DIAGNOSIS — W228XXA Striking against or struck by other objects, initial encounter: Secondary | ICD-10-CM | POA: Diagnosis not present

## 2017-02-16 DIAGNOSIS — F0281 Dementia in other diseases classified elsewhere with behavioral disturbance: Secondary | ICD-10-CM | POA: Diagnosis not present

## 2017-02-16 DIAGNOSIS — E119 Type 2 diabetes mellitus without complications: Secondary | ICD-10-CM | POA: Diagnosis not present

## 2017-02-16 DIAGNOSIS — Z79899 Other long term (current) drug therapy: Secondary | ICD-10-CM | POA: Diagnosis not present

## 2017-02-16 DIAGNOSIS — M6281 Muscle weakness (generalized): Secondary | ICD-10-CM | POA: Diagnosis not present

## 2017-02-16 DIAGNOSIS — Z8673 Personal history of transient ischemic attack (TIA), and cerebral infarction without residual deficits: Secondary | ICD-10-CM | POA: Diagnosis not present

## 2017-02-16 DIAGNOSIS — G300 Alzheimer's disease with early onset: Secondary | ICD-10-CM | POA: Diagnosis not present

## 2017-02-16 DIAGNOSIS — G9341 Metabolic encephalopathy: Secondary | ICD-10-CM | POA: Diagnosis not present

## 2017-02-16 DIAGNOSIS — R4589 Other symptoms and signs involving emotional state: Secondary | ICD-10-CM | POA: Diagnosis not present

## 2017-02-16 DIAGNOSIS — S0080XA Unspecified superficial injury of other part of head, initial encounter: Secondary | ICD-10-CM | POA: Diagnosis not present

## 2017-02-16 DIAGNOSIS — F918 Other conduct disorders: Secondary | ICD-10-CM | POA: Diagnosis not present

## 2017-02-16 DIAGNOSIS — E7800 Pure hypercholesterolemia, unspecified: Secondary | ICD-10-CM | POA: Diagnosis not present

## 2017-02-16 DIAGNOSIS — S134XXA Sprain of ligaments of cervical spine, initial encounter: Secondary | ICD-10-CM | POA: Diagnosis not present

## 2017-02-16 DIAGNOSIS — E039 Hypothyroidism, unspecified: Secondary | ICD-10-CM | POA: Diagnosis not present

## 2017-02-16 DIAGNOSIS — F419 Anxiety disorder, unspecified: Secondary | ICD-10-CM | POA: Diagnosis not present

## 2017-02-16 LAB — GLUCOSE, CAPILLARY: Glucose-Capillary: 170 mg/dL — ABNORMAL HIGH (ref 65–99)

## 2017-02-16 MED ORDER — LORAZEPAM 2 MG/ML PO CONC: 2.0000 mg | Freq: Three times a day (TID) | ORAL | 0 refills | Status: DC | PRN

## 2017-02-16 NOTE — Progress Notes (Signed)
CSW assisting with d/c planning. Awaiting return call [facility] decision. [facility]. Awaiting [facility] from [facility]. CSW will continue to follow to assist with d/c planning needs.  Cori RazorJamie Stefanos Haynesworth LCSW [phone]

## 2017-02-16 NOTE — Progress Notes (Signed)
Report given to Reuel Boomaniel at [facility] for transfer. Delford FieldGagliano, Dijon Kohlman E, RN

## 2017-02-16 NOTE — Discharge Summary (Signed)
Physician Discharge Summary  Toni Sullivan ZOX:[id number] DOB: 1950-11-21 DOA: 02/03/2017  PCP: Excell Seltzer, MD  Admit date: 02/03/2017 Discharge date: 02/16/2017  Admitted From:Home Disposition:SNF  Recommendations for Outpatient Follow-up:  1. Follow up with PCP and your psychiatrist in 1-2 weeks 2. Please obtain BMP/CBC in one week  Home Health:SNF Equipment/Devices:none Discharge Condition:stable CODE STATUS:full Diet recommendation:heart healthy  Brief/Interim Summary:66 y.o.femalewho presented to ED from with worsening mental status changes.pt has developed moreaggressive, confused, combative behavior x 2-3 weeks. Apparently she has as needed ativan but did not receive any recently. No other associated systemic symptoms. The husband reported on admission that patient was diagnosed with dementia in 2011and at the same time she had a stroke and followed up by a neurologist.Patient was recently started Depakote 125 mg 3 times daily for controlling  agitation which was not helpful.  #Acute metabolic encephalopathy/dementia in Alzheimer disease with behavioral disturbance: -Patient is a calm and comfortable and not requiring any restraint for more than 48 hours. -This is likely progression of her dementia. Patient treated with antibiotics for UTI without clinical improvement. Memantine was discontinued and continue rivastigmine. -Evaluated by psychiatrist, increased the dose of Depakote, continue Seroquel. -Ativan oral as needed for agitation. Prescription provided. Recommended to follow up with psychiatrist outpatient. -On vitamin B12, TSH, RPR unremarkable. -Discussed with the Child psychotherapist. Patient has bed available at a skilled facility. Patient is medically stable to transfer her care to outpatient.  #Hyperammonemia: Improved without treatment.  #Pyuria likely contaminant. Complicated 4 days of ceftriaxone.  # Hypokalemia: Improved. Encourage oral intake.  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   Acute lower UTI   Aggressive behavior   Urinary tract infection without hematuria    Discharge Instructions  Discharge Instructions    Call MD for:  difficulty breathing, headache or visual disturbances    Complete by:  As directed    Call MD for:  extreme fatigue    Complete by:  As directed    Call MD for:  hives    Complete by:  As directed    Call MD for:  persistant dizziness or light-headedness    Complete by:  As directed    Call MD for:  persistant nausea and vomiting    Complete by:  As directed    Call MD for:  severe uncontrolled pain    Complete by:  As directed    Call MD for:  temperature >100.4    Complete by:  As directed    Diet - low sodium heart healthy    Complete by:  As directed    Increase activity slowly    Complete by:  As directed      Allergies as of 02/16/2017      Reactions   Penicillins    Has patient had a PCN reaction causing immediate rash, facial/tongue/throat swelling, SOB or lightheadedness with hypotension: Unknown Has patient had a PCN reaction causing severe rash involving mucus membranes or skin necrosis: Unknown Has patient had a PCN reaction that required hospitalization: Unknown Has patient had a PCN reaction occurring within the last 10 years: No If all of the above answers are "NO", then may proceed with Cephalosporin use.      Medication List    STOP taking these medications   memantine 10 MG tablet Commonly known as:  NAMENDA     TAKE these  medications   divalproex 125 MG capsule Commonly known as:  DEPAKOTE SPRINKLE Take 2 capsules (250 mg total) by mouth 3 (three) times daily. With meals What changed:  how much to take   LORazepam 2 MG/ML concentrated solution Commonly known as:  LORAZEPAM INTENSOL Take 1 mL (2 mg total) by mouth every 8 (eight) hours  as needed for anxiety. What changed:  how much to take  when to take this  Another medication with the same name was removed. Continue taking this medication, and follow the directions you see here.   QUEtiapine 25 MG tablet Commonly known as:  SEROQUEL Take 1 tablet (25 mg total) by mouth 2 (two) times daily.   Rivastigmine 13.3 MG/24HR Pt24 Commonly known as:  EXELON Apply 1 patch (13.3 mg total) topically daily.      Follow-up Information    Excell SeltzerBedsole, Amy E, MD. Schedule an appointment as soon as possible for a visit in 1 week(s).   Specialty:  Family Medicine Contact information: [address] [place] [address] [phone]          Allergies  Allergen Reactions  . Penicillins     Has patient had a PCN reaction causing immediate rash, facial/tongue/throat swelling, SOB or lightheadedness with hypotension: Unknown Has patient had a PCN reaction causing severe rash involving mucus membranes or skin necrosis: Unknown Has patient had a PCN reaction that required hospitalization: Unknown Has patient had a PCN reaction occurring within the last 10 years: No If all of the above answers are "NO", then may proceed with Cephalosporin use.     Consultations: Psychiatrist  Procedures/Studies: None  Subjective: Seen and examined at bedside. She was calm comfortable and denies any needs.  Discharge Exam: Vitals:   02/15/17 2022 02/16/17 0649  BP: 111/76 108/87  Pulse: 84 70  Resp: 18 20  Temp: 97 F (36.1 C) 97.8 F (36.6 C)   Vitals:   02/15/17 0629 02/15/17 1346 02/15/17 2022 02/16/17 0649  BP: 98/69 98/79 111/76 108/87  Pulse: 65 87 84 70  Resp: 16 17 18 20   Temp: 97.2 F (36.2 C) 97.6 F (36.4 C) 97 F (36.1 C) 97.8 F (36.6 C)  TempSrc: Axillary Axillary Axillary Axillary  SpO2: 97% 100% 99% 100%  Weight:      Height:        General: Pt is alert, awake, not in acute distress Cardiovascular: RRR, S1/S2 +, no rubs, no  gallops Respiratory: CTA bilaterally, no wheezing, no rhonchi Abdominal: Soft, NT, ND, bowel sounds + Extremities: no edema, no cyanosis    The results of significant diagnostics from this hospitalization (including imaging, microbiology, ancillary and laboratory) are listed below for reference.     Microbiology: No results found for this or any previous visit (from the past 240 hour(s)).   Labs: BNP (last 3 results) No results for input(s): BNP in the last 8760 hours. Basic Metabolic Panel: No results for input(s): NA, K, CL, CO2, GLUCOSE, BUN, CREATININE, CALCIUM, MG, PHOS in the last 168 hours. Liver Function Tests: No results for input(s): AST, ALT, ALKPHOS, BILITOT, PROT, ALBUMIN in the last 168 hours. No results for input(s): LIPASE, AMYLASE in the last 168 hours. No results for input(s): AMMONIA in the last 168 hours. CBC: No results for input(s): WBC, NEUTROABS, HGB, HCT, MCV, PLT in the last 168 hours. Cardiac Enzymes: No results for input(s): CKTOTAL, CKMB, CKMBINDEX, TROPONINI in the last 168 hours. BNP: Invalid input(s): POCBNP CBG:  Recent Labs Lab  02/12/17 0721 02/13/17 0852 02/14/17 0916 02/15/17 0754 02/16/17 1139  GLUCAP 92 88 164* 101* 170*   D-Dimer No results for input(s): DDIMER in the last 72 hours. Hgb A1c No results for input(s): HGBA1C in the last 72 hours. Lipid Profile No results for input(s): CHOL, HDL, LDLCALC, TRIG, CHOLHDL, LDLDIRECT in the last 72 hours. Thyroid function studies No results for input(s): TSH, T4TOTAL, T3FREE, THYROIDAB in the last 72 hours.  Invalid input(s): FREET3 Anemia work up No results for input(s): VITAMINB12, FOLATE, FERRITIN, TIBC, IRON, RETICCTPCT in the last 72 hours. Urinalysis    Component Value Date/Time   COLORURINE YELLOW 02/03/2017 1357   APPEARANCEUR HAZY (A) 02/03/2017 1357   LABSPEC 1.020 02/03/2017 1357   PHURINE 5.0 02/03/2017 1357   GLUCOSEU NEGATIVE 02/03/2017 1357   HGBUR MODERATE (A)  02/03/2017 1357   BILIRUBINUR NEGATIVE 02/03/2017 1357   KETONESUR 5 (A) 02/03/2017 1357   PROTEINUR 30 (A) 02/03/2017 1357   NITRITE NEGATIVE 02/03/2017 1357   LEUKOCYTESUR LARGE (A) 02/03/2017 1357   Sepsis Labs Invalid input(s): PROCALCITONIN,  WBC,  LACTICIDVEN Microbiology No results found for this or any previous visit (from the past 240 hour(s)).   Time coordinating discharge: 28 minutes  SIGNED:   Maxie Barb, MD  [facility] 02/16/2017, 1:15 PM  If 7PM-7AM, please contact night-coverage [email or web address] Password [email or web address]

## 2017-02-16 NOTE — Progress Notes (Addendum)
Bed alarm went off in patient's room and patient noted to be attempting to get out of bed. NT Raynald KempSimone Hall arrived to assist this RN. Patient uncooperative, not following directions to return to seated or recumbent position, and striking out at NT. Patient assisted to bed by NT with the assistance of RN Shearon StallsDana Dark. Patient given 1mg  Ativan IM in L deltoid by this RN with minimal assistance to help patient remain still for injection. Patient had good response to Ativan and resting comfortably in bed. Patient's husband arrived and patient assisted to chair. Patient conversing calmly with husband. Delford FieldGagliano, Rettie Laird E, RN

## 2017-02-16 NOTE — Progress Notes (Signed)
PT Cancellation Note  Patient Details Name: Toni Sullivan MRN: [id number] DOB: 01/24/1951   Cancelled Treatment:    Reason Eval/Treat Not Completed: Other (comment); Spoke with pt's spouse.  Reports he walks with her in the hall multiple times a day.  Feels she is not currently in need of skilled PT.  Will sign off per spouse report/preference.  [place] Wynn, [place] CarolinaPT [phone] 02/16/2017    [name] 02/16/2017, 12:27 PM

## 2017-02-16 NOTE — Clinical Social Work Placement (Signed)
   CLINICAL SOCIAL WORK PLACEMENT  NOTE  Date:  02/16/2017  Patient Details  Name: Toni Sullivan MRN: [id number] Date of Birth: 03/22/1951  Clinical Social Work is seeking post-discharge placement for this patient at the Skilled  Nursing Facility level of care (*CSW will initial, date and re-position this form in  chart as items are completed):  Yes   Patient/family provided with [facility] Clinical Social Work Department's list of facilities offering this level of care within the geographic area requested by the patient (or if unable, by the patient's family).  Yes   Patient/family informed of their freedom to choose among providers that offer the needed level of care, that participate in Medicare, Medicaid or managed care program needed by the patient, have an available bed and are willing to accept the patient.  Yes   Patient/family informed of [facility]'s ownership interest in [facility] and [facility], as well as of the fact that they are under no obligation to receive care at these facilities.  PASRR submitted to EDS on 02/09/17     PASRR number received on 02/09/17     Existing PASRR number confirmed on       FL2 transmitted to all facilities in geographic area requested by pt/family on 02/16/17     FL2 transmitted to all facilities within larger geographic area on       Patient informed that his/her managed care company has contracts with or will negotiate with certain facilities, including the following:        Yes   Patient/family informed of bed offers received.  [facility]     Physician recommends and patient chooses bed at      Patient to be [facility] on 02/16/17.  Patient to be transferred to facility by CAR     Patient family notified on 02/16/17 of transfer.  Name of family member notified:  Spouse     PHYSICIAN       Additional Comment: Spouse is in agreement with d/c to  [facility]. Spouse is in agreement with pvt pay placement. Spouse feels strongly about transporting pt and has declined [facility] transport. " My wife will get very upset if she has to go in an ambulance. She loves car rides. I know I won't have any trouble with her. " D/C Summary sent to SNF for review. Scripts include in [employer] packet. # for report provided to nsg. D/C packet provided to spouse by nsg.   _______________________________________________ Royetta AsalHaidinger, Zacchary Pompei Lee, LCSW 02/16/2017, 2:54 PM

## 2017-02-16 NOTE — Care Management Note (Signed)
Case Management Note  Patient Details  Name: Toni LibraBonnie G Macauley MRN: [id number] Date of Birth: 04/10/1951  Subjective/Objective: Per CSW d/c SNF w/memory care unit! Great news!                   Action/Plan:d/c SNF   Expected Discharge Date:  02/16/17               Expected Discharge Plan:  Skilled Nursing Facility  In-House Referral:  Clinical Social Work  Discharge planning Services  CM Consult  Post Acute Care Choice:    Choice offered to:     DME Arranged:    DME Agency:     HH Arranged:    HH Agency:     Status of Service:  Completed, signed off  If discussed at [employer] of [employer] Meetings, dates discussed:    Additional Comments:  Lanier ClamMahabir, Jamisen Hawes, RN 02/16/2017, 2:14 PM

## 2017-03-03 DIAGNOSIS — W228XXA Striking against or struck by other objects, initial encounter: Secondary | ICD-10-CM | POA: Diagnosis not present

## 2017-03-03 DIAGNOSIS — S134XXA Sprain of ligaments of cervical spine, initial encounter: Secondary | ICD-10-CM | POA: Diagnosis not present

## 2017-03-03 DIAGNOSIS — S0080XA Unspecified superficial injury of other part of head, initial encounter: Secondary | ICD-10-CM | POA: Diagnosis not present

## 2017-03-07 ENCOUNTER — Ambulatory Visit: Payer: PPO | Admitting: Neurology

## 2017-04-07 DIAGNOSIS — R296 Repeated falls: Secondary | ICD-10-CM | POA: Diagnosis not present

## 2017-04-07 DIAGNOSIS — E876 Hypokalemia: Secondary | ICD-10-CM | POA: Diagnosis not present

## 2017-04-07 DIAGNOSIS — G309 Alzheimer's disease, unspecified: Secondary | ICD-10-CM | POA: Diagnosis not present

## 2017-04-07 DIAGNOSIS — S0990XA Unspecified injury of head, initial encounter: Secondary | ICD-10-CM | POA: Diagnosis not present

## 2017-04-07 DIAGNOSIS — R451 Restlessness and agitation: Secondary | ICD-10-CM | POA: Diagnosis not present

## 2017-04-07 DIAGNOSIS — F0281 Dementia in other diseases classified elsewhere with behavioral disturbance: Secondary | ICD-10-CM | POA: Diagnosis not present

## 2017-04-07 DIAGNOSIS — I959 Hypotension, unspecified: Secondary | ICD-10-CM | POA: Diagnosis not present

## 2017-04-07 DIAGNOSIS — R9431 Abnormal electrocardiogram [ECG] [EKG]: Secondary | ICD-10-CM | POA: Diagnosis not present

## 2017-04-07 DIAGNOSIS — Z7409 Other reduced mobility: Secondary | ICD-10-CM | POA: Diagnosis not present

## 2017-04-07 DIAGNOSIS — Z23 Encounter for immunization: Secondary | ICD-10-CM | POA: Diagnosis not present

## 2017-04-07 DIAGNOSIS — S199XXA Unspecified injury of neck, initial encounter: Secondary | ICD-10-CM | POA: Diagnosis not present

## 2017-04-07 DIAGNOSIS — Z66 Do not resuscitate: Secondary | ICD-10-CM | POA: Diagnosis not present

## 2017-04-07 DIAGNOSIS — Z79899 Other long term (current) drug therapy: Secondary | ICD-10-CM | POA: Diagnosis not present

## 2017-04-07 DIAGNOSIS — E7220 Disorder of urea cycle metabolism, unspecified: Secondary | ICD-10-CM | POA: Diagnosis not present

## 2017-04-07 DIAGNOSIS — M47892 Other spondylosis, cervical region: Secondary | ICD-10-CM | POA: Diagnosis not present

## 2017-04-07 DIAGNOSIS — I4581 Long QT syndrome: Secondary | ICD-10-CM | POA: Diagnosis not present

## 2017-04-07 DIAGNOSIS — Z515 Encounter for palliative care: Secondary | ICD-10-CM | POA: Diagnosis not present

## 2017-04-07 DIAGNOSIS — M50322 Other cervical disc degeneration at C5-C6 level: Secondary | ICD-10-CM | POA: Diagnosis not present

## 2017-04-07 DIAGNOSIS — F0391 Unspecified dementia with behavioral disturbance: Secondary | ICD-10-CM | POA: Diagnosis not present

## 2017-04-07 DIAGNOSIS — S0003XA Contusion of scalp, initial encounter: Secondary | ICD-10-CM | POA: Diagnosis not present

## 2017-04-07 DIAGNOSIS — Z8673 Personal history of transient ischemic attack (TIA), and cerebral infarction without residual deficits: Secondary | ICD-10-CM | POA: Diagnosis not present

## 2017-04-07 DIAGNOSIS — M50323 Other cervical disc degeneration at C6-C7 level: Secondary | ICD-10-CM | POA: Diagnosis not present

## 2017-04-07 DIAGNOSIS — I6782 Cerebral ischemia: Secondary | ICD-10-CM | POA: Diagnosis not present

## 2017-04-07 DIAGNOSIS — W19XXXD Unspecified fall, subsequent encounter: Secondary | ICD-10-CM | POA: Diagnosis not present

## 2017-04-07 DIAGNOSIS — I672 Cerebral atherosclerosis: Secondary | ICD-10-CM | POA: Diagnosis not present

## 2017-04-07 DIAGNOSIS — I771 Stricture of artery: Secondary | ICD-10-CM | POA: Diagnosis not present

## 2017-04-07 DIAGNOSIS — M50321 Other cervical disc degeneration at C4-C5 level: Secondary | ICD-10-CM | POA: Diagnosis not present

## 2017-04-07 DIAGNOSIS — Y92239 Unspecified place in hospital as the place of occurrence of the external cause: Secondary | ICD-10-CM | POA: Diagnosis not present

## 2017-04-07 DIAGNOSIS — R270 Ataxia, unspecified: Secondary | ICD-10-CM | POA: Diagnosis not present

## 2017-04-07 DIAGNOSIS — N3001 Acute cystitis with hematuria: Secondary | ICD-10-CM | POA: Diagnosis not present

## 2017-04-07 DIAGNOSIS — Z880 Allergy status to penicillin: Secondary | ICD-10-CM | POA: Diagnosis not present

## 2017-04-07 DIAGNOSIS — S0083XA Contusion of other part of head, initial encounter: Secondary | ICD-10-CM | POA: Diagnosis not present

## 2017-04-07 DIAGNOSIS — M25512 Pain in left shoulder: Secondary | ICD-10-CM | POA: Diagnosis not present

## 2017-04-07 DIAGNOSIS — G319 Degenerative disease of nervous system, unspecified: Secondary | ICD-10-CM | POA: Diagnosis not present

## 2017-04-07 DIAGNOSIS — Z7189 Other specified counseling: Secondary | ICD-10-CM | POA: Diagnosis not present

## 2017-04-07 DIAGNOSIS — Z818 Family history of other mental and behavioral disorders: Secondary | ICD-10-CM | POA: Diagnosis not present

## 2017-04-08 DIAGNOSIS — S0990XA Unspecified injury of head, initial encounter: Secondary | ICD-10-CM | POA: Diagnosis not present

## 2017-04-08 DIAGNOSIS — M50322 Other cervical disc degeneration at C5-C6 level: Secondary | ICD-10-CM | POA: Diagnosis not present

## 2017-04-08 DIAGNOSIS — G309 Alzheimer's disease, unspecified: Secondary | ICD-10-CM | POA: Diagnosis not present

## 2017-04-08 DIAGNOSIS — F0281 Dementia in other diseases classified elsewhere with behavioral disturbance: Secondary | ICD-10-CM | POA: Diagnosis not present

## 2017-04-08 DIAGNOSIS — R270 Ataxia, unspecified: Secondary | ICD-10-CM | POA: Diagnosis not present

## 2017-04-08 DIAGNOSIS — Z880 Allergy status to penicillin: Secondary | ICD-10-CM | POA: Diagnosis not present

## 2017-04-08 DIAGNOSIS — M50321 Other cervical disc degeneration at C4-C5 level: Secondary | ICD-10-CM | POA: Diagnosis not present

## 2017-04-08 DIAGNOSIS — E7220 Disorder of urea cycle metabolism, unspecified: Secondary | ICD-10-CM | POA: Diagnosis not present

## 2017-04-08 DIAGNOSIS — G319 Degenerative disease of nervous system, unspecified: Secondary | ICD-10-CM | POA: Diagnosis not present

## 2017-04-08 DIAGNOSIS — Y92239 Unspecified place in hospital as the place of occurrence of the external cause: Secondary | ICD-10-CM | POA: Diagnosis not present

## 2017-04-08 DIAGNOSIS — E876 Hypokalemia: Secondary | ICD-10-CM | POA: Diagnosis not present

## 2017-04-08 DIAGNOSIS — Z818 Family history of other mental and behavioral disorders: Secondary | ICD-10-CM | POA: Diagnosis not present

## 2017-04-08 DIAGNOSIS — I959 Hypotension, unspecified: Secondary | ICD-10-CM | POA: Diagnosis not present

## 2017-04-08 DIAGNOSIS — Z515 Encounter for palliative care: Secondary | ICD-10-CM | POA: Diagnosis not present

## 2017-04-08 DIAGNOSIS — Z23 Encounter for immunization: Secondary | ICD-10-CM | POA: Diagnosis not present

## 2017-04-08 DIAGNOSIS — M25512 Pain in left shoulder: Secondary | ICD-10-CM | POA: Diagnosis not present

## 2017-04-08 DIAGNOSIS — Z7189 Other specified counseling: Secondary | ICD-10-CM | POA: Diagnosis not present

## 2017-04-08 DIAGNOSIS — I672 Cerebral atherosclerosis: Secondary | ICD-10-CM | POA: Diagnosis not present

## 2017-04-08 DIAGNOSIS — S0003XA Contusion of scalp, initial encounter: Secondary | ICD-10-CM | POA: Diagnosis not present

## 2017-04-08 DIAGNOSIS — N3001 Acute cystitis with hematuria: Secondary | ICD-10-CM | POA: Diagnosis not present

## 2017-04-08 DIAGNOSIS — F0391 Unspecified dementia with behavioral disturbance: Secondary | ICD-10-CM | POA: Diagnosis not present

## 2017-04-08 DIAGNOSIS — Z8673 Personal history of transient ischemic attack (TIA), and cerebral infarction without residual deficits: Secondary | ICD-10-CM | POA: Diagnosis not present

## 2017-04-08 DIAGNOSIS — I4581 Long QT syndrome: Secondary | ICD-10-CM | POA: Diagnosis not present

## 2017-04-08 DIAGNOSIS — M50323 Other cervical disc degeneration at C6-C7 level: Secondary | ICD-10-CM | POA: Diagnosis not present

## 2017-04-08 DIAGNOSIS — Z7409 Other reduced mobility: Secondary | ICD-10-CM | POA: Diagnosis not present

## 2017-04-08 DIAGNOSIS — I6782 Cerebral ischemia: Secondary | ICD-10-CM | POA: Diagnosis not present

## 2017-04-08 DIAGNOSIS — M47892 Other spondylosis, cervical region: Secondary | ICD-10-CM | POA: Diagnosis not present

## 2017-04-08 DIAGNOSIS — I771 Stricture of artery: Secondary | ICD-10-CM | POA: Diagnosis not present

## 2017-04-08 DIAGNOSIS — S199XXA Unspecified injury of neck, initial encounter: Secondary | ICD-10-CM | POA: Diagnosis not present

## 2017-04-08 DIAGNOSIS — W19XXXD Unspecified fall, subsequent encounter: Secondary | ICD-10-CM | POA: Diagnosis not present

## 2017-04-08 DIAGNOSIS — R451 Restlessness and agitation: Secondary | ICD-10-CM | POA: Diagnosis not present

## 2017-04-08 DIAGNOSIS — R9431 Abnormal electrocardiogram [ECG] [EKG]: Secondary | ICD-10-CM | POA: Diagnosis not present

## 2017-04-08 DIAGNOSIS — R296 Repeated falls: Secondary | ICD-10-CM | POA: Diagnosis not present

## 2017-04-08 DIAGNOSIS — Z79899 Other long term (current) drug therapy: Secondary | ICD-10-CM | POA: Diagnosis not present

## 2017-04-08 DIAGNOSIS — Z66 Do not resuscitate: Secondary | ICD-10-CM | POA: Diagnosis not present

## 2017-04-24 ENCOUNTER — Ambulatory Visit: Payer: PPO | Admitting: Neurology

## 2017-05-23 DIAGNOSIS — Y9212 Nursing home as the place of occurrence of the external cause: Secondary | ICD-10-CM | POA: Diagnosis not present

## 2017-05-23 DIAGNOSIS — F323 Major depressive disorder, single episode, severe with psychotic features: Secondary | ICD-10-CM | POA: Diagnosis not present

## 2017-05-29 DIAGNOSIS — R54 Age-related physical debility: Secondary | ICD-10-CM | POA: Diagnosis not present

## 2017-05-29 DIAGNOSIS — G9341 Metabolic encephalopathy: Secondary | ICD-10-CM | POA: Diagnosis not present

## 2017-05-29 DIAGNOSIS — F0151 Vascular dementia with behavioral disturbance: Secondary | ICD-10-CM | POA: Diagnosis not present

## 2017-05-29 DIAGNOSIS — G300 Alzheimer's disease with early onset: Secondary | ICD-10-CM | POA: Diagnosis not present

## 2017-05-29 DIAGNOSIS — F419 Anxiety disorder, unspecified: Secondary | ICD-10-CM | POA: Diagnosis not present

## 2017-05-29 DIAGNOSIS — Z8673 Personal history of transient ischemic attack (TIA), and cerebral infarction without residual deficits: Secondary | ICD-10-CM | POA: Diagnosis not present

## 2017-05-30 ENCOUNTER — Emergency Department (HOSPITAL_COMMUNITY)
Admission: EM | Admit: 2017-05-30 | Discharge: 2017-05-30 | Disposition: A | Discharge status: Home / Self Care | Payer: PPO | Attending: Emergency Medicine | Admitting: Emergency Medicine

## 2017-05-30 ENCOUNTER — Emergency Department (HOSPITAL_COMMUNITY): Payer: PPO

## 2017-05-30 ENCOUNTER — Encounter (HOSPITAL_COMMUNITY): Payer: Self-pay | Admitting: Emergency Medicine

## 2017-05-30 DIAGNOSIS — S098XXA Other specified injuries of head, initial encounter: Secondary | ICD-10-CM | POA: Diagnosis not present

## 2017-05-30 DIAGNOSIS — S299XXA Unspecified injury of thorax, initial encounter: Secondary | ICD-10-CM | POA: Diagnosis not present

## 2017-05-30 DIAGNOSIS — S0990XA Unspecified injury of head, initial encounter: Secondary | ICD-10-CM | POA: Diagnosis not present

## 2017-05-30 DIAGNOSIS — Y939 Activity, unspecified: Secondary | ICD-10-CM | POA: Insufficient documentation

## 2017-05-30 DIAGNOSIS — M549 Dorsalgia, unspecified: Secondary | ICD-10-CM | POA: Diagnosis not present

## 2017-05-30 DIAGNOSIS — W01190A Fall on same level from slipping, tripping and stumbling with subsequent striking against furniture, initial encounter: Secondary | ICD-10-CM | POA: Insufficient documentation

## 2017-05-30 DIAGNOSIS — Z79899 Other long term (current) drug therapy: Secondary | ICD-10-CM | POA: Insufficient documentation

## 2017-05-30 DIAGNOSIS — Y999 Unspecified external cause status: Secondary | ICD-10-CM | POA: Diagnosis not present

## 2017-05-30 DIAGNOSIS — Y92129 Unspecified place in nursing home as the place of occurrence of the external cause: Secondary | ICD-10-CM | POA: Insufficient documentation

## 2017-05-30 DIAGNOSIS — G309 Alzheimer's disease, unspecified: Secondary | ICD-10-CM | POA: Insufficient documentation

## 2017-05-30 DIAGNOSIS — M546 Pain in thoracic spine: Secondary | ICD-10-CM | POA: Diagnosis not present

## 2017-05-30 DIAGNOSIS — G4489 Other headache syndrome: Secondary | ICD-10-CM | POA: Diagnosis not present

## 2017-05-30 DIAGNOSIS — W19XXXA Unspecified fall, initial encounter: Secondary | ICD-10-CM

## 2017-05-30 DIAGNOSIS — S199XXA Unspecified injury of neck, initial encounter: Secondary | ICD-10-CM | POA: Diagnosis not present

## 2017-05-30 NOTE — ED Notes (Signed)
ATTEMPTING TO CALL REPORT WITH [phone]. THERE IS NO DIAL TONE. FAMILY ATTEMPTS TO USE THIS LINE ALSO WITH SAME RESULT. DELAY WITH DISCHARGE WITH THIS PT. REQUESTING ASSISTANCE FROM [facility] DISPATCH FOR ADDITIONAL NUMBERS

## 2017-05-30 NOTE — ED Notes (Signed)
[place] [phone]. ATTEMPTED TO CALL. MESSAGE LEFT. DISCHARGE INSTRUCTIONS GIVEN TO HUSBAND. INFORMED UNABLE TO GIVE REPORT.

## 2017-05-30 NOTE — ED Notes (Signed)
HUSBAND PRESENT AND UPDATED BY EDPA

## 2017-05-30 NOTE — Discharge Instructions (Signed)
Patient without any fractures. Treat pain with tylenol and ice. Follow up with Dr. Pattricia Boss

## 2017-05-30 NOTE — ED Notes (Signed)
EDPA Provider at bedside. 

## 2017-05-30 NOTE — ED Triage Notes (Signed)
Per EMS-states she fell from a standing position-fell forward striking head on a chair-states no LOC, no blood thinners-child C-Collar placed-at baseline mentally per facility

## 2017-05-30 NOTE — ED Notes (Signed)
Toni Sullivan [id number].

## 2017-05-30 NOTE — ED Notes (Signed)
Patient is resting comfortably. 

## 2017-05-30 NOTE — ED Provider Notes (Signed)
[facility]-EMERGENCY DEPT Provider Note   CSN: [id number] Arrival date & time: 05/30/17  1515     History   Chief Complaint Chief Complaint  Patient presents with  . Fall   Level V caveat due to dementia  HPI Toni Sullivan is a 66 y.o. female who arrives on C-spine precautions with c-collar in place after a witnessed fall at her nursing care facility. Patient struck her head on the back edge of the chair. She did not lose consciousness. She is unable to answer questions appropriately due to her baseline mental status.  HPI  Past Medical History:  Diagnosis Date  . Headache(784.0)   . Memory loss     Patient Active Problem List   Diagnosis Date Noted  . Aggressive behavior   . Urinary tract infection without hematuria   . Acute metabolic encephalopathy 02/03/2017  . Acute lower UTI 02/03/2017  . 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 History of CVA (cerebrovascular accident) 07/03/2013    Past Surgical History:  Procedure Laterality Date  . MOUTH SURGERY    . SKIN CANCER EXCISION      OB History    No data available       Home Medications    Prior to Admission medications   Medication Sig Start Date End Date Taking? Authorizing Provider  acetaminophen (TYLENOL) 325 MG tablet Take 650 mg by mouth every 8 (eight) hours as needed (for pain level 1-8 or temperature greater than 101 degrees).   Yes [provider]  divalproex (DEPAKOTE) 125 MG DR tablet Take 125 mg by mouth 3 (three) times daily with meals. 03/17/17  Yes [provider]  haloperidol (HALDOL) 0.5 MG tablet Take 0.5 mg by mouth at bedtime.    Yes [provider]  LORazepam (ATIVAN) 0.5 MG tablet Take 0.5 mg by mouth every 6 (six) hours as needed for anxiety. 05/22/17  Yes [provider]  mirtazapine (REMERON) 15 MG tablet Take 15 mg by mouth at bedtime.   Yes [provider]  Rivastigmine (EXELON) 13.3  MG/24HR PT24 Apply 1 patch (13.3 mg total) topically daily. 09/01/16  Yes Nilda Riggs, NP  sertraline (ZOLOFT) 100 MG tablet Take 100 mg by mouth daily.   Yes [provider]  divalproex (DEPAKOTE SPRINKLE) 125 MG capsule Take 2 capsules (250 mg total) by mouth 3 (three) times daily. With meals Patient not taking: Reported on 05/30/2017 02/07/17   Catarina Hartshorn, MD  QUEtiapine (SEROQUEL) 25 MG tablet Take 1 tablet (25 mg total) by mouth 2 (two) times daily. Patient not taking: Reported on 05/30/2017 02/07/17   Catarina Hartshorn, MD    Family History No family history on file.  Social History Social History  Substance Use Topics  . Smoking status: Never Smoker  . Smokeless tobacco: Never Used  . Alcohol use 0.0 oz/week     Comment: 1 glass daily     Allergies   Penicillins   Review of Systems Review of Systems  Unable to review systems due to dementia Physical Exam Updated Vital Signs BP 108/79 (BP Location: Left Arm)   Pulse 64   Temp 98.2 F (36.8 C) (Axillary)   Resp 16   SpO2 100%   Physical Exam  Constitutional: She appears well-developed and well-nourished. Cervical collar in place.  HENT:  Head: Normocephalic and atraumatic.  Eyes: Pupils are equal, round, and reactive to light. EOM are normal.  Musculoskeletal:  Back:  Nursing note and vitals reviewed.    ED Treatments / Results  Labs (all labs ordered are listed, but only abnormal results are displayed) Labs Reviewed - No data to display  EKG  EKG Interpretation None       Radiology Dg Thoracic Spine 2 View  Result Date: 05/30/2017 CLINICAL DATA:  Initial evaluation for acute trauma, fall. EXAM: THORACIC SPINE 2 VIEWS COMPARISON:  None. FINDINGS: Straightening of the normal thoracic kyphosis with minimal levoscoliosis. No listhesis or subluxation. Vertebral body heights maintained. No acute fracture or malalignment. 3.5 cm calcified gallstone noted. No acute soft tissue abnormality.  Visualized heart and lungs unremarkable. IMPRESSION: 1. No radiographic evidence for acute traumatic injury within the thoracic spine. 2. 3.5 cm calcified gallstone. Electronically Signed   By: Rise Mu M.D.   On: 05/30/2017 17:10   Ct Head Wo Contrast  Result Date: 05/30/2017 CLINICAL DATA:  Initial evaluation for acute trauma, fall. EXAM: CT HEAD WITHOUT CONTRAST CT CERVICAL SPINE WITHOUT CONTRAST TECHNIQUE: Multidetector CT imaging of the head and cervical spine was performed following the standard protocol without intravenous contrast. Multiplanar CT image reconstructions of the cervical spine were also generated. COMPARISON:  None. FINDINGS: CT HEAD FINDINGS Brain: Temporal lobe predominant cerebral atrophy. Moderate chronic microvascular ischemic disease. No acute intracranial hemorrhage. No evidence for acute large vessel territory infarct. No mass lesion, midline shift or mass effect. Ventricular prominence related to global parenchymal volume loss without hydrocephalus. No extra-axial fluid collection. Vascular: No hyperdense vessel. Scattered vascular calcifications noted within the carotid siphons. Skull: Scalp soft tissues and calvarium within normal limits. Sinuses/Orbits: The globes and orbital soft tissues within normal limits. Visualized paranasal sinuses are clear. No mastoid effusion. Other: None. CT CERVICAL SPINE FINDINGS Alignment: Straightening of the normal cervical lordosis. Trace anterolisthesis of C7 on T1. Skull base and vertebrae: Skullbase intact. Normal C1-2 articulations are preserved. Dens is intact. Vertebral body heights maintained. No acute fracture. Soft tissues and spinal canal: Soft tissues of the neck demonstrate no acute abnormality. No abnormal prevertebral edema. Disc levels: Moderate multilevel degenerative spondylolysis at C4-5 through C6-7. Right-sided facet arthrosis present at C7-T1. Upper chest: Visualized upper chest within normal limits. Visualized  lung apices are clear. No apical pneumothorax. Other: None. IMPRESSION: CT BRAIN: 1. No acute intracranial process identified. 2. Moderate cerebral atrophy with chronic small vessel ischemic disease. CT CERVICAL SPINE: 1. No acute traumatic injury within the cervical spine. 2. Moderate degenerative spondylolysis at C4-5 through C6-7. Electronically Signed   By: Rise Mu M.D.   On: 05/30/2017 17:08   Ct Cervical Spine Wo Contrast  Result Date: 05/30/2017 CLINICAL DATA:  Initial evaluation for acute trauma, fall. EXAM: CT HEAD WITHOUT CONTRAST CT CERVICAL SPINE WITHOUT CONTRAST TECHNIQUE: Multidetector CT imaging of the head and cervical spine was performed following the standard protocol without intravenous contrast. Multiplanar CT image reconstructions of the cervical spine were also generated. COMPARISON:  None. FINDINGS: CT HEAD FINDINGS Brain: Temporal lobe predominant cerebral atrophy. Moderate chronic microvascular ischemic disease. No acute intracranial hemorrhage. No evidence for acute large vessel territory infarct. No mass lesion, midline shift or mass effect. Ventricular prominence related to global parenchymal volume loss without hydrocephalus. No extra-axial fluid collection. Vascular: No hyperdense vessel. Scattered vascular calcifications noted within the carotid siphons. Skull: Scalp soft tissues and calvarium within normal limits. Sinuses/Orbits: The globes and orbital soft tissues within normal limits. Visualized paranasal sinuses are clear. No mastoid effusion. Other: None. CT CERVICAL SPINE FINDINGS Alignment: Straightening of  the normal cervical lordosis. Trace anterolisthesis of C7 on T1. Skull base and vertebrae: Skullbase intact. Normal C1-2 articulations are preserved. Dens is intact. Vertebral body heights maintained. No acute fracture. Soft tissues and spinal canal: Soft tissues of the neck demonstrate no acute abnormality. No abnormal prevertebral edema. Disc levels:  Moderate multilevel degenerative spondylolysis at C4-5 through C6-7. Right-sided facet arthrosis present at C7-T1. Upper chest: Visualized upper chest within normal limits. Visualized lung apices are clear. No apical pneumothorax. Other: None. IMPRESSION: CT BRAIN: 1. No acute intracranial process identified. 2. Moderate cerebral atrophy with chronic small vessel ischemic disease. CT CERVICAL SPINE: 1. No acute traumatic injury within the cervical spine. 2. Moderate degenerative spondylolysis at C4-5 through C6-7. Electronically Signed   By: Rise Mu M.D.   On: 05/30/2017 17:08    Procedures Procedures (including critical care time)  Medications Ordered in ED Medications - No data to display   Initial Impression / Assessment and Plan / ED Course  I have reviewed the triage vital signs and the nursing notes.  Pertinent labs & imaging results that were available during my care of the patient were reviewed by me and considered in my medical decision making (see chart for details).     Patient's imaging without any acute abnormality. She appears to be her baseline mental status. Patient appears safe for discharge back to her skilled nursing facility. Treat with Tylenol and ice.  Final Clinical Impressions(s) / ED Diagnoses   Final diagnoses:  Fall  Fall  Fall    New Prescriptions New Prescriptions   No medications on file     Arthor Captain, Cordelia Poche 05/30/17 1741    Azalia Bilis, MD 05/31/17 [phone]

## 2017-05-30 NOTE — ED Notes (Signed)
Bed: [facility] Expected date:  Expected time:  Means of arrival:  Comments: EMS fall 

## 2017-06-12 ENCOUNTER — Encounter (HOSPITAL_COMMUNITY): Payer: Self-pay | Admitting: Nurse Practitioner

## 2017-06-12 ENCOUNTER — Inpatient Hospital Stay (HOSPITAL_COMMUNITY)
Admission: EM | Admit: 2017-06-12 | Discharge: 2017-06-15 | DRG: 689 | Disposition: A | Discharge status: Home / Self Care | Payer: PPO | Attending: Internal Medicine | Admitting: Internal Medicine

## 2017-06-12 DIAGNOSIS — E870 Hyperosmolality and hypernatremia: Secondary | ICD-10-CM | POA: Diagnosis not present

## 2017-06-12 DIAGNOSIS — Z85828 Personal history of other malignant neoplasm of skin: Secondary | ICD-10-CM

## 2017-06-12 DIAGNOSIS — R410 Disorientation, unspecified: Secondary | ICD-10-CM | POA: Diagnosis not present

## 2017-06-12 DIAGNOSIS — Z66 Do not resuscitate: Secondary | ICD-10-CM | POA: Diagnosis not present

## 2017-06-12 DIAGNOSIS — N390 Urinary tract infection, site not specified: Principal | ICD-10-CM | POA: Diagnosis present

## 2017-06-12 DIAGNOSIS — R456 Violent behavior: Secondary | ICD-10-CM | POA: Diagnosis not present

## 2017-06-12 DIAGNOSIS — G309 Alzheimer's disease, unspecified: Secondary | ICD-10-CM | POA: Diagnosis not present

## 2017-06-12 DIAGNOSIS — G9340 Encephalopathy, unspecified: Secondary | ICD-10-CM

## 2017-06-12 DIAGNOSIS — N3000 Acute cystitis without hematuria: Secondary | ICD-10-CM

## 2017-06-12 DIAGNOSIS — R4182 Altered mental status, unspecified: Secondary | ICD-10-CM | POA: Diagnosis not present

## 2017-06-12 DIAGNOSIS — R451 Restlessness and agitation: Secondary | ICD-10-CM | POA: Diagnosis not present

## 2017-06-12 DIAGNOSIS — E860 Dehydration: Secondary | ICD-10-CM | POA: Diagnosis not present

## 2017-06-12 DIAGNOSIS — G300 Alzheimer's disease with early onset: Secondary | ICD-10-CM

## 2017-06-12 DIAGNOSIS — E876 Hypokalemia: Secondary | ICD-10-CM | POA: Diagnosis present

## 2017-06-12 DIAGNOSIS — R4689 Other symptoms and signs involving appearance and behavior: Secondary | ICD-10-CM | POA: Diagnosis present

## 2017-06-12 DIAGNOSIS — F0281 Dementia in other diseases classified elsewhere with behavioral disturbance: Secondary | ICD-10-CM | POA: Diagnosis present

## 2017-06-12 DIAGNOSIS — F02818 Dementia in other diseases classified elsewhere, unspecified severity, with other behavioral disturbance: Secondary | ICD-10-CM | POA: Diagnosis present

## 2017-06-12 DIAGNOSIS — N3001 Acute cystitis with hematuria: Secondary | ICD-10-CM | POA: Diagnosis not present

## 2017-06-12 DIAGNOSIS — Z1611 Resistance to penicillins: Secondary | ICD-10-CM | POA: Diagnosis present

## 2017-06-12 DIAGNOSIS — G9341 Metabolic encephalopathy: Secondary | ICD-10-CM | POA: Diagnosis not present

## 2017-06-12 DIAGNOSIS — D649 Anemia, unspecified: Secondary | ICD-10-CM | POA: Diagnosis not present

## 2017-06-12 DIAGNOSIS — F0280 Dementia in other diseases classified elsewhere without behavioral disturbance: Secondary | ICD-10-CM | POA: Diagnosis not present

## 2017-06-12 DIAGNOSIS — F29 Unspecified psychosis not due to a substance or known physiological condition: Secondary | ICD-10-CM | POA: Diagnosis not present

## 2017-06-12 LAB — URINALYSIS, ROUTINE W REFLEX MICROSCOPIC
BILIRUBIN URINE: NEGATIVE
GLUCOSE, UA: NEGATIVE mg/dL
KETONES UR: 5 mg/dL — AB
Nitrite: NEGATIVE
PROTEIN: 100 mg/dL — AB
SQUAMOUS EPITHELIAL / LPF: NONE SEEN
Specific Gravity, Urine: 1.017 (ref 1.005–1.030)
pH: 8 (ref 5.0–8.0)

## 2017-06-12 LAB — CBC WITH DIFFERENTIAL/PLATELET
BASOS ABS: 0 10*3/uL (ref 0.0–0.1)
Basophils Relative: 0 %
EOS PCT: 1 %
Eosinophils Absolute: 0 10*3/uL (ref 0.0–0.7)
HEMATOCRIT: 36.2 % (ref 36.0–46.0)
HEMOGLOBIN: 11.9 g/dL — AB (ref 12.0–15.0)
LYMPHS PCT: 20 %
Lymphs Abs: 1.7 10*3/uL (ref 0.7–4.0)
MCH: 28.5 pg (ref 26.0–34.0)
MCHC: 32.9 g/dL (ref 30.0–36.0)
MCV: 86.6 fL (ref 78.0–100.0)
Monocytes Absolute: 0.5 10*3/uL (ref 0.1–1.0)
Monocytes Relative: 6 %
NEUTROS ABS: 6.2 10*3/uL (ref 1.7–7.7)
Neutrophils Relative %: 73 %
PLATELETS: 197 10*3/uL (ref 150–400)
RBC: 4.18 MIL/uL (ref 3.87–5.11)
RDW: 14.1 % (ref 11.5–15.5)
WBC: 8.4 10*3/uL (ref 4.0–10.5)

## 2017-06-12 LAB — COMPREHENSIVE METABOLIC PANEL
ALBUMIN: 3 g/dL — AB (ref 3.5–5.0)
ALK PHOS: 88 U/L (ref 38–126)
ALT: 21 U/L (ref 14–54)
AST: 29 U/L (ref 15–41)
Anion gap: 10 (ref 5–15)
BILIRUBIN TOTAL: 0.9 mg/dL (ref 0.3–1.2)
BUN: 22 mg/dL — AB (ref 6–20)
CO2: 24 mmol/L (ref 22–32)
Calcium: 8.5 mg/dL — ABNORMAL LOW (ref 8.9–10.3)
Chloride: 113 mmol/L — ABNORMAL HIGH (ref 101–111)
Creatinine, Ser: 0.8 mg/dL (ref 0.44–1.00)
GFR calc Af Amer: >60 mL/min (ref >60)
GFR calc non Af Amer: >60 mL/min (ref >60)
GLUCOSE: 88 mg/dL (ref 65–99)
POTASSIUM: 3.4 mmol/L — AB (ref 3.5–5.1)
Sodium: 147 mmol/L — ABNORMAL HIGH (ref 135–145)
TOTAL PROTEIN: 6.3 g/dL — AB (ref 6.5–8.1)

## 2017-06-12 LAB — ACETAMINOPHEN LEVEL: Acetaminophen (Tylenol), Serum: <10 ug/mL — ABNORMAL LOW (ref 10–30)

## 2017-06-12 LAB — ETHANOL: Alcohol, Ethyl (B): <10 mg/dL (ref <10)

## 2017-06-12 LAB — SALICYLATE LEVEL: Salicylate Lvl: <7 mg/dL (ref 2.8–30.0)

## 2017-06-12 MED ORDER — SODIUM CHLORIDE 0.9 % IV BOLUS (SEPSIS)
500.0000 mL | Freq: Once | INTRAVENOUS | Status: AC
  Administered 2017-06-12: 500 mL via INTRAVENOUS

## 2017-06-12 MED ORDER — ACETAMINOPHEN 325 MG PO TABS: 650.0000 mg | ORAL_TABLET | Freq: Three times a day (TID) | ORAL | Status: DC | PRN

## 2017-06-12 MED ORDER — HALOPERIDOL LACTATE 5 MG/ML IJ SOLN
5.0000 mg | Freq: Once | INTRAMUSCULAR | Status: AC
  Administered 2017-06-12: 5 mg via INTRAMUSCULAR
  Filled 2017-06-12: qty 1

## 2017-06-12 MED ORDER — HALOPERIDOL 0.5 MG PO TABS
0.5000 mg | ORAL_TABLET | Freq: Every day | ORAL | Status: DC
  Administered 2017-06-12 – 2017-06-14 (×3): 0.5 mg via ORAL
  Filled 2017-06-12 (×3): qty 1

## 2017-06-12 MED ORDER — LORAZEPAM 0.5 MG PO TABS
0.5000 mg | ORAL_TABLET | Freq: Four times a day (QID) | ORAL | Status: DC | PRN
  Administered 2017-06-14: 0.5 mg via ORAL
  Filled 2017-06-12: qty 1

## 2017-06-12 MED ORDER — HALOPERIDOL LACTATE 5 MG/ML IJ SOLN
2.0000 mg | Freq: Once | INTRAMUSCULAR | Status: AC
  Administered 2017-06-12: 2 mg via INTRAMUSCULAR
  Filled 2017-06-12: qty 1

## 2017-06-12 MED ORDER — DEXTROSE 5 % IV SOLN
2.0000 g | Freq: Once | INTRAVENOUS | Status: AC
  Administered 2017-06-12: 2 g via INTRAVENOUS
  Filled 2017-06-12: qty 2

## 2017-06-12 MED ORDER — ENOXAPARIN SODIUM 40 MG/0.4ML ~~LOC~~ SOLN
40.0000 mg | SUBCUTANEOUS | Status: DC
  Administered 2017-06-13 – 2017-06-14 (×2): 40 mg via SUBCUTANEOUS
  Filled 2017-06-12 (×3): qty 0.4

## 2017-06-12 MED ORDER — RIVASTIGMINE 13.3 MG/24HR TD PT24
13.3000 mg | MEDICATED_PATCH | Freq: Every day | TRANSDERMAL | Status: DC
  Administered 2017-06-13 – 2017-06-15 (×3): 13.3 mg via TRANSDERMAL
  Filled 2017-06-12 (×3): qty 1

## 2017-06-12 MED ORDER — DEXTROSE 5 % IV SOLN
1.0000 g | INTRAVENOUS | Status: DC
  Administered 2017-06-13 – 2017-06-14 (×2): 1 g via INTRAVENOUS
  Filled 2017-06-12 (×3): qty 10

## 2017-06-12 MED ORDER — SODIUM CHLORIDE 0.9 % IV SOLN
INTRAVENOUS | Status: DC
  Administered 2017-06-12 – 2017-06-15 (×4): via INTRAVENOUS

## 2017-06-12 MED ORDER — POTASSIUM CHLORIDE CRYS ER 20 MEQ PO TBCR
40.0000 meq | EXTENDED_RELEASE_TABLET | Freq: Once | ORAL | Status: AC
  Administered 2017-06-12: 40 meq via ORAL
  Filled 2017-06-12: qty 2

## 2017-06-12 MED ORDER — DIVALPROEX SODIUM 125 MG PO DR TAB
125.0000 mg | DELAYED_RELEASE_TABLET | Freq: Three times a day (TID) | ORAL | Status: DC
  Administered 2017-06-12 – 2017-06-15 (×9): 125 mg via ORAL
  Filled 2017-06-12 (×10): qty 1

## 2017-06-12 MED ORDER — MIRTAZAPINE 15 MG PO TABS
15.0000 mg | ORAL_TABLET | Freq: Every day | ORAL | Status: DC
  Administered 2017-06-12 – 2017-06-14 (×3): 15 mg via ORAL
  Filled 2017-06-12 (×3): qty 1

## 2017-06-12 MED ORDER — SERTRALINE HCL 100 MG PO TABS
100.0000 mg | ORAL_TABLET | Freq: Every day | ORAL | Status: DC
  Administered 2017-06-13 – 2017-06-15 (×3): 100 mg via ORAL
  Filled 2017-06-12 (×3): qty 1

## 2017-06-12 NOTE — ED Notes (Signed)
Report given to Floor RN 

## 2017-06-12 NOTE — ED Notes (Signed)
Bed: [facility]98 Expected date:  Expected time:  Means of arrival:  Comments: Hold for TCU 26

## 2017-06-12 NOTE — ED Provider Notes (Signed)
Emergency Department Provider Note   I have reviewed the triage vital signs and the nursing notes.   HISTORY  Chief Complaint No chief complaint on file.   HPI Toni Sullivan is a 66 y.o. female with PMH of Alzheimer's disease and aggressive behavior presents to the ED via EMS from [facility] where the patient has been increasingly combative. She has apparently been kicking and hitting staff who were trying to assist her. Try to get out of bed. Staff reports she's been compliant with her medications including medications for aggression and sedation but this does not seem to help. She's not had any falls or head trauma. Staff denies any medical symptoms such as urinary frequency, fevers, cough, or other symptoms.  Level 5 caveat: Severe dementia.    Past Medical History:  Diagnosis Date  . Headache(784.0)   . Memory loss     Patient Active Problem List   Diagnosis Date Noted  . UTI (urinary tract infection) 06/12/2017  . Aggressive behavior   . Urinary tract infection without hematuria   . Acute metabolic encephalopathy 02/03/2017  . Acute lower UTI 02/03/2017  . 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 History of CVA (cerebrovascular accident) 07/03/2013    Past Surgical History:  Procedure Laterality Date  . MOUTH SURGERY    . SKIN CANCER EXCISION      Current Outpatient Rx  . Order #: [id number] Class: Historical Med  . Order #: [id number] Class: Historical Med  . Order #: [id number] Class: Historical Med  . Order #: [id number] Class: Historical Med  . Order #: [id number] Class: Historical Med  . Order #: [id number] Class: Normal  . Order #: [id number] Class: Historical Med    Allergies Penicillins  History reviewed. No pertinent family history.  Social History Social History  Substance Use Topics  . Smoking status: Never Smoker  . Smokeless tobacco: Never Used  . Alcohol use 0.0 oz/week     Comment: 1  glass daily    Review of Systems  Level 5 caveat: Severe dementia.   ____________________________________________   PHYSICAL EXAM:  VITAL SIGNS: ED Triage Vitals  Enc Vitals Group     BP 06/12/17 1019 (!) 158/105     Pulse Rate 06/12/17 1019 64     Resp 06/12/17 1019 16     Temp 06/12/17 1019 97.7 F (36.5 C)     Temp Source 06/12/17 1019 Axillary     SpO2 06/12/17 1019 97 %     Weight 06/12/17 1012 142 lb (64.4 kg)     Height 06/12/17 1012 5' (1.524 m)   Constitutional: Confused and kicking in the air and shouting.  Eyes: Conjunctivae are normal.  Head: Atraumatic. Nose: No congestion/rhinnorhea. Mouth/Throat: Mucous membranes are moist. Neck: No stridor.  Cardiovascular: Normal rate, regular rhythm. Good peripheral circulation. Grossly normal heart sounds.   Respiratory: Normal respiratory effort.  No retractions. Lungs CTAB. Gastrointestinal: Soft and nontender. No distention.  Musculoskeletal: No lower extremity tenderness nor edema. No gross deformities of extremities. Neurologic:  Confused but no gross focal neurologic deficits are appreciated.  Skin:  Skin is warm, dry and intact. No rash noted.  ____________________________________________   LABS (all labs ordered are listed, but only abnormal results are displayed)  Labs Reviewed  COMPREHENSIVE METABOLIC PANEL - Abnormal; Notable for the following:       Result Value   Sodium 147 (*)    Potassium 3.4 (*)    Chloride 113 (*)    BUN  22 (*)    Calcium 8.5 (*)    Total Protein 6.3 (*)    Albumin 3.0 (*)    All other components within normal limits  ACETAMINOPHEN LEVEL - Abnormal; Notable for the following:    Acetaminophen (Tylenol), Serum <10 (*)    All other components within normal limits  CBC WITH DIFFERENTIAL/PLATELET - Abnormal; Notable for the following:    Hemoglobin 11.9 (*)    All other components within normal limits  URINALYSIS, ROUTINE W REFLEX MICROSCOPIC - Abnormal; Notable for the  following:    APPearance CLOUDY (*)    Hgb urine dipstick SMALL (*)    Ketones, ur 5 (*)    Protein, ur 100 (*)    Leukocytes, UA LARGE (*)    Bacteria, UA MANY (*)    All other components within normal limits  URINE CULTURE  ETHANOL  SALICYLATE LEVEL   ____________________________________________  RADIOLOGY  None ____________________________________________   PROCEDURES  Procedure(s) performed:   Procedures  CRITICAL CARE Performed by: Maia Plan Total critical care time: 45 minutes Critical care time was exclusive of separately billable procedures and treating other patients. Critical care was necessary to treat or prevent imminent or life-threatening deterioration. Critical care was time spent personally by me on the following activities: development of treatment plan with patient and/or surrogate as well as nursing, discussions with consultants, evaluation of patient's response to treatment, examination of patient, obtaining history from patient or surrogate, ordering and performing treatments and interventions, ordering and review of laboratory studies, ordering and review of radiographic studies, pulse oximetry and re-evaluation of patient's condition.  Alona Bene, MD Emergency Medicine  ____________________________________________   INITIAL IMPRESSION / ASSESSMENT AND PLAN / ED COURSE  Pertinent labs & imaging results that were available during my care of the patient were reviewed by me and considered in my medical decision making (see chart for details).  Patient presents to the emergency department for evaluation of increased agitation. Has baseline dementia. Has been aggressive with staff and kicking/hitting as well as try to get out of bed. She is exhibiting some of this behavior on my initial evaluation. Review of medications show half milligram oral Haldol given at bedtime. No medications given prior to arrival by report. Plan for intramuscular Haldol 2 mg  and reassess after labs.   Patient with UTI. Patient requiring additional haldol here in the ED. Plan for admission.   At this time, I do not feel there is any life-threatening condition present. I have reviewed and discussed all results (EKG, imaging, lab, urine as appropriate), exam findings with patient. I have reviewed nursing notes and appropriate previous records.  I feel the patient is safe to be discharged home without further emergent workup. Discussed usual and customary return precautions. Patient and family (if present) verbalize understanding and are comfortable with this plan.  Patient will follow-up with their primary care provider. If they do not have a primary care provider, information for follow-up has been provided to them. All questions have been answered.  ____________________________________________  FINAL CLINICAL IMPRESSION(S) / ED DIAGNOSES  Final diagnoses:  Encephalopathy  Aggressive behavior  Acute cystitis without hematuria     MEDICATIONS GIVEN DURING THIS VISIT:  Medications  cefTRIAXone (ROCEPHIN) 1 g in dextrose 5 % 50 mL IVPB (not administered)  haloperidol lactate (HALDOL) injection 2 mg (2 mg Intramuscular Given 06/12/17 1039)  sodium chloride 0.9 % bolus 500 mL (0 mLs Intravenous Stopped 06/12/17 1555)  cefTRIAXone (ROCEPHIN) 2 g in dextrose  5 % 50 mL IVPB (0 g Intravenous Stopped 06/12/17 1555)  haloperidol lactate (HALDOL) injection 5 mg (5 mg Intramuscular Given 06/12/17 1418)     NEW OUTPATIENT MEDICATIONS STARTED DURING THIS VISIT:  New Prescriptions   No medications on file    Note:  This document was prepared using Dragon voice recognition software and may include unintentional dictation errors.  Alona Bene, MD Emergency Medicine    Long, Arlyss Repress, MD 06/12/17 [phone]

## 2017-06-12 NOTE — ED Notes (Signed)
Bed: [facility]10 Expected date: 06/12/17 Expected time:  Means of arrival:  Comments: EMS-dementia

## 2017-06-12 NOTE — H&P (Signed)
History and Physical    [name]:[id number] DOB: 06-26-1951 DOA: 06/12/2017  PCP: Excell Seltzer, MD  Patient coming from: [facility]  I have personally briefly reviewed patient's old medical records in [facility]  Chief Complaint: combative, AMS  HPI: Toni Sullivan is Toni Sullivan 66 y.o. female with medical history significant of dementia who presents from [place] due to increased combativeness behavior.  History is limited due to the patient's mental status. Per chart review and after discussing with staff, the patient has been increasingly combative. She was kicking and hitting staff and try to get out of bed. There been no obvious symptoms of infection.  No fevers.  ED Course: Labs, antibiotics, haldol.  Admit for UTI.   Review of Systems: Unable to obtain due to mental status.   Past Medical History:  Diagnosis Date  . Headache(784.0)   . Memory loss     Past Surgical History:  Procedure Laterality Date  . MOUTH SURGERY    . SKIN CANCER EXCISION       reports that she has never smoked. She has never used smokeless tobacco. She reports that she drinks alcohol. She reports that she does not use drugs.  Allergies  Allergen Reactions  . Penicillins Other (See Comments)    Unknown reaction Has patient had Chaynce Schafer PCN reaction causing immediate rash, facial/tongue/throat swelling, SOB or lightheadedness with hypotension: Unknown Has patient had Ashla Murph PCN reaction causing severe rash involving mucus membranes or skin necrosis: Unknown Has patient had Abrish Erny PCN reaction that required hospitalization: Unknown Has patient had Martine Trageser PCN reaction occurring within the last 10 years: No If all of the above answers are "NO", then may proceed with Cephalosporin use.     History reviewed. No pertinent family history.  Prior to Admission medications   Medication Sig Start Date End Date Taking? Authorizing Provider  acetaminophen (TYLENOL) 325 MG tablet Take 650 mg by mouth  every 8 (eight) hours as needed (for pain level 1-8 or temperature greater than 101 degrees).   Yes [provider]  divalproex (DEPAKOTE) 125 MG DR tablet Take 125 mg by mouth 3 (three) times daily with meals. 03/17/17  Yes [provider]  haloperidol (HALDOL) 0.5 MG tablet Take 0.5 mg by mouth at bedtime.    Yes [provider]  LORazepam (ATIVAN) 0.5 MG tablet Take 0.5 mg by mouth every 6 (six) hours as needed for anxiety. 05/22/17  Yes [provider]  mirtazapine (REMERON) 15 MG tablet Take 15 mg by mouth at bedtime.   Yes [provider]  Rivastigmine (EXELON) 13.3 MG/24HR PT24 Apply 1 patch (13.3 mg total) topically daily. 09/01/16  Yes Nilda Riggs, NP  sertraline (ZOLOFT) 100 MG tablet Take 100 mg by mouth daily.   Yes [provider]    Physical Exam: Vitals:   06/12/17 1019 06/12/17 1209 06/12/17 1700 06/12/17 1900  BP: (!) 158/105 106/72 (!) 143/92 106/61  Pulse: 64 64 66 94  Resp: Temp: 97.7 F (36.5 C) 97.7 F (36.5 C) 97.8 F (36.6 C) 98.7 F (37.1 C)  TempSrc: Axillary Axillary Oral Oral  SpO2: 97% 96% 98% 100%  Weight:      Height:        Constitutional: frail 67 yo women, talking to no one in particular Vitals:   06/12/17 1019 06/12/17 1209 06/12/17 1700 06/12/17 1900  BP: (!) 158/105 106/72 (!) 143/92 106/61  Pulse: 64 64 66 94  Resp: Temp: 97.7 F (36.5 C) 97.7 F (36.5 C) 97.8 F (36.6 C) 98.7 F (37.1 C)  TempSrc: Axillary Axillary Oral Oral  SpO2: 97% 96% 98% 100%  Weight:      Height:       Difficult as patient agitated  Eyes: PERRL, lids and conjunctivae normal ENMT: Mucous membranes are moist. Neck: normal, supple, no masses Respiratory: clear to auscultation bilaterally, no wheezing, no crackles. Normal respiratory effort. No accessory muscle use.  Cardiovascular: Regular rate and rhythm, no murmurs / rubs / gallops. No extremity edema. 2+ pedal pulses. No  carotid bruits.  Abdomen: no tenderness, no masses palpated. No hepatosplenomegaly. Bowel sounds positive.  Musculoskeletal: no clubbing / cyanosis. No joint deformity upper and lower extremities. Good ROM, no contractures. Normal muscle tone.  Skin: no rashes, lesions, ulcers. No induration Neurologic: CN 2-12 grossly intact. Moving all extremities  Psychiatric: Disoriented, mumbling incoherently.  Occasionally will respond to question, but Effa Yarrow&Ox0 for me. Labs on Admission: I have personally reviewed following labs and imaging studies  CBC:  Recent Labs Lab 06/12/17 1200  WBC 8.4  NEUTROABS 6.2  HGB 11.9*  HCT 36.2  MCV 86.6  PLT 197   Basic Metabolic Panel:  Recent Labs Lab 06/12/17 1200  NA 147*  K 3.4*  CL 113*  CO2 24  GLUCOSE 88  BUN 22*  CREATININE 0.80  CALCIUM 8.5*   GFR: Estimated Creatinine Clearance: 56.4 mL/min (by C-G formula based on SCr of 0.8 mg/dL). Liver Function Tests:  Recent Labs Lab 06/12/17 1200  AST 29  ALT 21  ALKPHOS 88  BILITOT 0.9  PROT 6.3*  ALBUMIN 3.0*   No results for input(s): LIPASE, AMYLASE in the last 168 hours. No results for input(s): AMMONIA in the last 168 hours. Coagulation Profile: No results for input(s): INR, PROTIME in the last 168 hours. Cardiac Enzymes: No results for input(s): CKTOTAL, CKMB, CKMBINDEX, TROPONINI in the last 168 hours. BNP (last 3 results) No results for input(s): PROBNP in the last 8760 hours. HbA1C: No results for input(s): HGBA1C in the last 72 hours. CBG: No results for input(s): GLUCAP in the last 168 hours. Lipid Profile: No results for input(s): CHOL, HDL, LDLCALC, TRIG, CHOLHDL, LDLDIRECT in the last 72 hours. Thyroid Function Tests: No results for input(s): TSH, T4TOTAL, FREET4, T3FREE, THYROIDAB in the last 72 hours. Anemia Panel: No results for input(s): VITAMINB12, FOLATE, FERRITIN, TIBC, IRON, RETICCTPCT in the last 72 hours. Urine analysis:    Component Value Date/Time    COLORURINE YELLOW 06/12/2017 1155   APPEARANCEUR CLOUDY (Lou Irigoyen) 06/12/2017 1155   LABSPEC 1.017 06/12/2017 1155   PHURINE 8.0 06/12/2017 1155   GLUCOSEU NEGATIVE 06/12/2017 1155   HGBUR SMALL (Baillie Mohammad) 06/12/2017 1155   BILIRUBINUR NEGATIVE 06/12/2017 1155   KETONESUR 5 (Angeliz Settlemyre) 06/12/2017 1155   PROTEINUR 100 (Win Guajardo) 06/12/2017 1155   NITRITE NEGATIVE 06/12/2017 1155   LEUKOCYTESUR LARGE (Darlette Dubow) 06/12/2017 1155    Radiological Exams on Admission: No results found.  EKG: Independently reviewed. none  Assessment/Plan Active Problems:   UTI (urinary tract infection)  Altered mental status likely secondary to UTI:  Increasingly aggressive behavior at nursing home.  Pt with UA consistent with UTI.  This may be contributing to her delirium.  Normal WBC and vitals.  Will admit for antibiotics.  Ceftriaxone Follow urine cultures  Dementia:  Delirium precautions Continue home meds (depakote, ativan, haldol, rivastigmine)  Anemia: mild, continue to monitor Hypokalemia: mild, replete. F/u mag. Hypernatremia:  mild, ctm  DVT prophylaxis: lovenox  Code Status: DNR Family Communication: called husband, but no answer  Disposition Plan: back to [facility] Consults called: none  Admission status: obs    Lacretia Nicks MD [facility] [phone]   If 7PM-7AM, please contact night-coverage [email or web address] Password [email or web address]  06/12/2017, 7:33 PM

## 2017-06-12 NOTE — ED Triage Notes (Signed)
Patient comes [facility] with agiation and dementia. The staff denies change in urination and reports no fever. Patient is reported to be aggressive with hitting and kicking staff. Patient is trying to get out of bed repeatedly.

## 2017-06-13 DIAGNOSIS — E870 Hyperosmolality and hypernatremia: Secondary | ICD-10-CM | POA: Diagnosis present

## 2017-06-13 DIAGNOSIS — D649 Anemia, unspecified: Secondary | ICD-10-CM | POA: Diagnosis present

## 2017-06-13 DIAGNOSIS — R4689 Other symptoms and signs involving appearance and behavior: Secondary | ICD-10-CM | POA: Diagnosis not present

## 2017-06-13 DIAGNOSIS — G300 Alzheimer's disease with early onset: Secondary | ICD-10-CM | POA: Diagnosis not present

## 2017-06-13 DIAGNOSIS — Z1611 Resistance to penicillins: Secondary | ICD-10-CM | POA: Diagnosis present

## 2017-06-13 DIAGNOSIS — E876 Hypokalemia: Secondary | ICD-10-CM | POA: Diagnosis present

## 2017-06-13 DIAGNOSIS — N390 Urinary tract infection, site not specified: Secondary | ICD-10-CM | POA: Diagnosis present

## 2017-06-13 DIAGNOSIS — G9341 Metabolic encephalopathy: Secondary | ICD-10-CM | POA: Diagnosis present

## 2017-06-13 DIAGNOSIS — N3000 Acute cystitis without hematuria: Secondary | ICD-10-CM

## 2017-06-13 DIAGNOSIS — Z66 Do not resuscitate: Secondary | ICD-10-CM | POA: Diagnosis present

## 2017-06-13 DIAGNOSIS — R4182 Altered mental status, unspecified: Secondary | ICD-10-CM | POA: Diagnosis present

## 2017-06-13 DIAGNOSIS — F0280 Dementia in other diseases classified elsewhere without behavioral disturbance: Secondary | ICD-10-CM | POA: Diagnosis present

## 2017-06-13 DIAGNOSIS — E860 Dehydration: Secondary | ICD-10-CM | POA: Diagnosis present

## 2017-06-13 DIAGNOSIS — R410 Disorientation, unspecified: Secondary | ICD-10-CM | POA: Diagnosis not present

## 2017-06-13 DIAGNOSIS — G309 Alzheimer's disease, unspecified: Secondary | ICD-10-CM | POA: Diagnosis present

## 2017-06-13 DIAGNOSIS — Z85828 Personal history of other malignant neoplasm of skin: Secondary | ICD-10-CM | POA: Diagnosis not present

## 2017-06-13 DIAGNOSIS — F0281 Dementia in other diseases classified elsewhere with behavioral disturbance: Secondary | ICD-10-CM | POA: Diagnosis not present

## 2017-06-13 LAB — BASIC METABOLIC PANEL
ANION GAP: 5 (ref 5–15)
BUN: 15 mg/dL (ref 6–20)
CALCIUM: 7.8 mg/dL — AB (ref 8.9–10.3)
CO2: 23 mmol/L (ref 22–32)
CREATININE: 0.67 mg/dL (ref 0.44–1.00)
Chloride: 118 mmol/L — ABNORMAL HIGH (ref 101–111)
GFR calc non Af Amer: >60 mL/min (ref >60)
Glucose, Bld: 86 mg/dL (ref 65–99)
Potassium: 3.8 mmol/L (ref 3.5–5.1)
SODIUM: 146 mmol/L — AB (ref 135–145)

## 2017-06-13 LAB — CBC
HCT: 32.8 % — ABNORMAL LOW (ref 36.0–46.0)
Hemoglobin: 10.6 g/dL — ABNORMAL LOW (ref 12.0–15.0)
MCH: 28.1 pg (ref 26.0–34.0)
MCHC: 32.3 g/dL (ref 30.0–36.0)
MCV: 87 fL (ref 78.0–100.0)
PLATELETS: 178 10*3/uL (ref 150–400)
RBC: 3.77 MIL/uL — AB (ref 3.87–5.11)
RDW: 14.3 % (ref 11.5–15.5)
WBC: 5.8 10*3/uL (ref 4.0–10.5)

## 2017-06-13 LAB — MRSA PCR SCREENING: MRSA BY PCR: NEGATIVE

## 2017-06-13 NOTE — Progress Notes (Signed)
LCSW consulted as patient admitted from [facility]  Call placed:  Crystal at [facility] regarding admission [phone]. Crystal made aware of admission and update but did not provide any information. Call placed to [facility] as well as she is from facility to get information as well, limited provided.  LCSW went by room to assess patient, however unable to assess due to sleeping and no family in room.  Patient from memory care at [facility]. Will attempt to reach husband and discuss dc plans as well as baseline for patient/current needs. Full assessment to come once able to assess and obtain information.  Deretha Emory, MSW Clinical Social Work: Optician, dispensing Coverage for :  [phone]

## 2017-06-13 NOTE — Progress Notes (Signed)
PROGRESS NOTE    Toni Sullivan  ZOX:[id number] DOB: [birth year] DOA: 06/12/2017 PCP: Excell Seltzer, MD    Brief Narrative:  66 y.o. female with medical history significant of dementia who presents from [place] due to increased combativeness behavior.  History is limited due to the patient's mental status. Per chart review and after discussing with staff, the patient has been increasingly combative. She was kicking and hitting staff and try to get out of bed. There been no obvious symptoms of infection.  No fevers.  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  Urinalysis is suggestive of UTI..  -Patient is continued on Ceftriaxone with notable improvement overnight -Urine cultures are pending -Afebrile  Dementia:  -Pt is continued on home meds (depakote, ativan, haldol, rivastigmine)  Anemia:  -mild, continue to monitor -Repeat CBC in AM  Hypokalemia: -Replaced -Repeat bmet in AM  Hypernatremia:  -Improved with IVF hydration -Repeat bmet in AM  DVT prophylaxis: Lovenox subQ Code Status: DNR Family Communication: Pt in room, family at bedside Disposition Plan: Uncertain at this time  Consultants:     Procedures:     Antimicrobials: Anti-infectives    Start     Dose/Rate Route Frequency Ordered Stop   06/13/17 1400  cefTRIAXone (ROCEPHIN) 1 g in dextrose 5 % 50 mL IVPB     1 g 100 mL/hr over 30 Minutes Intravenous Every 24 hours 06/12/17 1457     06/12/17 1300  cefTRIAXone (ROCEPHIN) 2 g in dextrose 5 % 50 mL IVPB     2 g 100 mL/hr over 30 Minutes Intravenous  Once 06/12/17 1252 06/12/17 1555      Subjective: Pleasantly confused  Objective: Vitals:     06/12/17 1700 06/12/17 1900 06/13/17 0544 06/13/17 1426  BP: (!) 143/92 106/61 (!) 96/56 (!) 117/51  Pulse: 66 94 65 60  Resp: Temp: 97.8 F (36.6 C) 98.7 F (37.1 C) 97.6 F (36.4 C) 98.1 F (36.7 C)  TempSrc: Oral Oral Axillary Axillary  SpO2: 98% 100% 97% 99%  Weight:      Height:        Intake/Output Summary (Last 24 hours) at 06/13/17 1719 Last data filed at 06/13/17 1558  Gross per 24 hour  Intake           1422.5 ml  Output                0 ml  Net           1422.5 ml   Filed Weights   06/12/17 1012  Weight: 64.4 kg (142 lb)    Examination:  General exam: Appears calm and comfortable  Respiratory system: Clear to auscultation. Respiratory effort normal. Cardiovascular system: S1 & S2 heard, RRR Gastrointestinal system: Abdomen is nondistended, soft and nontender. No organomegaly or masses felt. Normal bowel sounds heard. Central nervous system: Alert and oriented. No focal neurological deficits. Extremities: Symmetric 5 x 5 power. Skin: No rashes, lesions Psychiatry: Pleasantly confused, mood appears normal.   Data Reviewed: I have personally reviewed following labs and imaging studies  CBC:  Recent Labs Lab 06/12/17 1200 06/13/17 0613  WBC 8.4 5.8  NEUTROABS 6.2  --  HGB 11.9* 10.6*  HCT 36.2 32.8*  MCV 86.6 87.0  PLT 197 178   Basic Metabolic Panel:  Recent Labs Lab 06/12/17 1200 06/13/17 0613  NA 147* 146*  K 3.4* 3.8  CL 113* 118*  CO2 24 23  GLUCOSE 88 86  BUN 22* 15  CREATININE 0.80 0.67  CALCIUM 8.5* 7.8*   GFR: Estimated Creatinine Clearance: 56.4 mL/min (by C-G formula based on SCr of 0.67 mg/dL). Liver Function Tests:  Recent Labs Lab 06/12/17 1200  AST 29  ALT 21  ALKPHOS 88  BILITOT 0.9  PROT 6.3*  ALBUMIN 3.0*   No results for input(s): LIPASE, AMYLASE in the last 168 hours. No results for input(s): AMMONIA in the last 168 hours. Coagulation Profile: No results for input(s): INR, PROTIME in the  last 168 hours. Cardiac Enzymes: No results for input(s): CKTOTAL, CKMB, CKMBINDEX, TROPONINI in the last 168 hours. BNP (last 3 results) No results for input(s): PROBNP in the last 8760 hours. HbA1C: No results for input(s): HGBA1C in the last 72 hours. CBG: No results for input(s): GLUCAP in the last 168 hours. Lipid Profile: No results for input(s): CHOL, HDL, LDLCALC, TRIG, CHOLHDL, LDLDIRECT in the last 72 hours. Thyroid Function Tests: No results for input(s): TSH, T4TOTAL, FREET4, T3FREE, THYROIDAB in the last 72 hours. Anemia Panel: No results for input(s): VITAMINB12, FOLATE, FERRITIN, TIBC, IRON, RETICCTPCT in the last 72 hours. Sepsis Labs: No results for input(s): PROCALCITON, LATICACIDVEN in the last 168 hours.  Recent Results (from the past 240 hour(s))  Urine culture     Status: None (Preliminary result)   Collection Time: 06/12/17 11:50 AM  Result Value Ref Range Status   Specimen Description URINE, CATHETERIZED  Final   Special Requests Normal  Final   Culture   Final    CULTURE REINCUBATED FOR BETTER GROWTH Performed at [facility], [address], [place], [place] [address]    Report Status PENDING  Incomplete  MRSA PCR Screening     Status: None   Collection Time: 06/13/17  7:20 AM  Result Value Ref Range Status   MRSA by PCR NEGATIVE NEGATIVE Final    Comment:        The GeneXpert MRSA Assay (FDA approved for NASAL specimens only), is one component of a comprehensive MRSA colonization surveillance program. It is not intended to diagnose MRSA infection nor to guide or monitor treatment for MRSA infections.      Radiology Studies: No results found.  Scheduled Meds: . divalproex  125 mg Oral TID WC  . enoxaparin (LOVENOX) injection  40 mg Subcutaneous Q24H  . haloperidol  0.5 mg Oral QHS  . mirtazapine  15 mg Oral QHS  . rivastigmine  13.3 mg Transdermal Daily  . sertraline  100 mg Oral Daily   Continuous Infusions: . sodium chloride  75 mL/hr at 06/13/17 1528  . cefTRIAXone (ROCEPHIN)  IV Stopped (06/13/17 1558)     LOS: 0 days   Lindi Abram, Scheryl Marten, MD [facility] Pager [phone]  If 7PM-7AM, please contact night-coverage [email or web address] Password [email or web address] 06/13/2017, 5:19 PM

## 2017-06-13 NOTE — Care Management Note (Signed)
Case Management Note  Patient Details  Name: Toni Sullivan MRN: [id number] Date of Birth: October 24, 1950  Subjective/Objective:                  ams due to systemic infection  Action/Plan: Date:  October 16,2 018 Chart reviewed for concurrent status and case management needs.  Will continue to follow patient progress.  Discharge Planning: following for needs  Expected discharge date: [id number]  Marcelle Smiling, BSN, [place], [place]   [phone]   Expected Discharge Date:   (unknown)               Expected Discharge Plan:  Assisted Living / Rest Home  In-House Referral:  Clinical Social Work  Discharge planning Services  CM Consult  Post Acute Care Choice:    Choice offered to:     DME Arranged:    DME Agency:     HH Arranged:    HH Agency:     Status of Service:  In process, will continue to follow  If discussed at Long Length of Stay Meetings, dates discussed:    Additional Comments:  Golda Acre, RN 06/13/2017, 9:16 AM

## 2017-06-14 DIAGNOSIS — E876 Hypokalemia: Secondary | ICD-10-CM

## 2017-06-14 DIAGNOSIS — N390 Urinary tract infection, site not specified: Principal | ICD-10-CM

## 2017-06-14 DIAGNOSIS — R4689 Other symptoms and signs involving appearance and behavior: Secondary | ICD-10-CM

## 2017-06-14 DIAGNOSIS — E870 Hyperosmolality and hypernatremia: Secondary | ICD-10-CM

## 2017-06-14 DIAGNOSIS — E860 Dehydration: Secondary | ICD-10-CM

## 2017-06-14 LAB — BASIC METABOLIC PANEL
Anion gap: 4 — ABNORMAL LOW (ref 5–15)
BUN: 10 mg/dL (ref 6–20)
CALCIUM: 7.7 mg/dL — AB (ref 8.9–10.3)
CO2: 24 mmol/L (ref 22–32)
Chloride: 113 mmol/L — ABNORMAL HIGH (ref 101–111)
Creatinine, Ser: 0.68 mg/dL (ref 0.44–1.00)
GFR calc Af Amer: >60 mL/min (ref >60)
GLUCOSE: 90 mg/dL (ref 65–99)
Potassium: 3.8 mmol/L (ref 3.5–5.1)
SODIUM: 141 mmol/L (ref 135–145)

## 2017-06-14 NOTE — Progress Notes (Addendum)
PROGRESS NOTE  Toni Sullivan ZOX:[id number] DOB: 08/10/1951 DOA: 06/12/2017 PCP: Excell SeltzerBedsole, Amy E, MD  HPI/Recap of past 24 hours:  Returned back to baseline per husband Patient is not able to provide history.  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 Patient is reported to be aggressive with hitting and kicking staff. Patient is trying to get out of bed repeatedly. -Urinalysis is suggestive of UTI. Urine culture with Dorna Maikelebsiella ornithinolytica. Patient does not have fever, no leukocytosis,  -Patient is continued on Ceftriaxone with notable improvement overnight  Hypernatremia:  -which could also contribute to aggressive behavior Likely from dehydration from poor oral intake -Improved with IVF hydration, continue hydration for another 24hrs   Hypokalemia: -Replaced , -Repeat bmet in AM, check mag   Anemia: nomocytic, no sign of bleeding -mild, continue to monitor -hgb >10  Dementia:  -she was hospitalized at a psychiatry hospital for a month from 8/11 to 9/24 due to dementia with behavioal distrubance -I have reviewed outside record, per psych discharge summary, patient is discharged on increased dose of zoloft, seroquel was discontinued. She is continued on mirtazapine, depakote, exelon, she is also on haldol qhs and ativan prn -Pt is continued on home meds (depakote, ativan, haldol, rivastigmine)    DVT prophylaxis: Lovenox subQ  Code Status: DNR  Family Communication: patient and husband at bedside  Disposition Plan: return to snf,   Consultants:  none  Procedures:  none  Antibiotics:  rocephin   Objective: BP (!) 97/58 (BP Location: Right Arm)    Pulse 65   Temp 97.8 F (36.6 C) (Oral)   Resp 18   Ht 5' (1.524 m)   Wt 64.4 kg (142 lb)   SpO2 98%   BMI 27.73 kg/m   Intake/Output Summary (Last 24 hours) at 06/14/17 1721 Last data filed at 06/14/17 1500  Gross per 24 hour  Intake           2567.5 ml  Output                0 ml  Net           2567.5 ml   Filed Weights   06/12/17 1012  Weight: 64.4 kg (142 lb)    Exam: Patient is examined daily including today on 06/14/2017, exams remain the same as of yesterday except that has changed    General:  NAD, only oriented to self, currently calm, but wont allow for physical exam, husband at bedside  Cardiovascular: wont allow , sinus rhythm on tele  Respiratory: wont allow exam, but on room air, no dyspnea, no hypoxia  Abdomen: Soft/ND/NT, positive BS  Musculoskeletal: No Edema  Neuro: alert, oriented only to self  Data Reviewed: Basic Metabolic Panel:  Recent Labs Lab 06/12/17 1200 06/13/17 0613 06/14/17 0650  NA 147* 146* 141  K 3.4* 3.8 3.8  CL 113* 118* 113*  CO2 24 23 24   GLUCOSE 88 86 90  BUN 22* 15 10  CREATININE 0.80 0.67 0.68  CALCIUM 8.5* 7.8* 7.7*   Liver Function Tests:  Recent Labs Lab 06/12/17 1200  AST 29  ALT 21  ALKPHOS 88  BILITOT 0.9  PROT 6.3*  ALBUMIN 3.0*   No results for input(s): LIPASE, AMYLASE  in the last 168 hours. No results for input(s): AMMONIA in the last 168 hours. CBC:  Recent Labs Lab 06/12/17 1200 06/13/17 0613  WBC 8.4 5.8  NEUTROABS 6.2  --   HGB 11.9* 10.6*  HCT 36.2 32.8*  MCV 86.6 87.0  PLT 197 178   Cardiac Enzymes:   No results for input(s): CKTOTAL, CKMB, CKMBINDEX, TROPONINI in the last 168 hours. BNP (last 3 results) No results for input(s): BNP in the last 8760 hours.  ProBNP (last 3 results) No results for input(s): PROBNP in the last 8760 hours.  CBG: No results for input(s): GLUCAP in the last 168 hours.  Recent Results (from the past 240 hour(s))  Urine culture     Status:  Abnormal (Preliminary result)   Collection Time: 06/12/17 11:50 AM  Result Value Ref Range Status   Specimen Description URINE, CATHETERIZED  Final   Special Requests Normal  Final   Culture >=100,000 COLONIES/mL KLEBSIELLA ORNITHINOLYTICA (A)  Final   Report Status PENDING  Incomplete  MRSA PCR Screening     Status: None   Collection Time: 06/13/17  7:20 AM  Result Value Ref Range Status   MRSA by PCR NEGATIVE NEGATIVE Final    Comment:        The GeneXpert MRSA Assay (FDA approved for NASAL specimens only), is one component of a comprehensive MRSA colonization surveillance program. It is not intended to diagnose MRSA infection nor to guide or monitor treatment for MRSA infections.      Studies: No results found.  Scheduled Meds: . divalproex  125 mg Oral TID WC  . enoxaparin (LOVENOX) injection  40 mg Subcutaneous Q24H  . haloperidol  0.5 mg Oral QHS  . mirtazapine  15 mg Oral QHS  . rivastigmine  13.3 mg Transdermal Daily  . sertraline  100 mg Oral Daily    Continuous Infusions: . sodium chloride 75 mL/hr at 06/14/17 1643  . cefTRIAXone (ROCEPHIN)  IV Stopped (06/14/17 1500)     Time spent: I have personally reviewed and interpreted on  06/14/2017 daily labs, tele strips, imagings as discussed above under date review session and assessment and plans.  I reviewed all nursing notes, vitals, pertinent old records  I have discussed plan of care as described above with RN , patient and family on 06/14/2017   Jayde Daffin MD, PhD  [facility] Pager [phone]. If 7PM-7AM, please contact night-coverage at [email or web address], password [facility] 06/14/2017, 5:21 PM  LOS: 1 day

## 2017-06-14 NOTE — Progress Notes (Signed)
CSW attempted to contact patient's spouse. Left voicemail.

## 2017-06-15 DIAGNOSIS — G9341 Metabolic encephalopathy: Secondary | ICD-10-CM

## 2017-06-15 DIAGNOSIS — F0281 Dementia in other diseases classified elsewhere with behavioral disturbance: Secondary | ICD-10-CM

## 2017-06-15 DIAGNOSIS — G300 Alzheimer's disease with early onset: Secondary | ICD-10-CM

## 2017-06-15 LAB — BASIC METABOLIC PANEL
ANION GAP: 8 (ref 5–15)
BUN: 7 mg/dL (ref 6–20)
CHLORIDE: 105 mmol/L (ref 101–111)
CO2: 25 mmol/L (ref 22–32)
Calcium: 8 mg/dL — ABNORMAL LOW (ref 8.9–10.3)
Creatinine, Ser: 0.55 mg/dL (ref 0.44–1.00)
GFR calc non Af Amer: >60 mL/min (ref >60)
Glucose, Bld: 81 mg/dL (ref 65–99)
POTASSIUM: 3.6 mmol/L (ref 3.5–5.1)
SODIUM: 138 mmol/L (ref 135–145)

## 2017-06-15 LAB — URINE CULTURE
Culture: >=100000 — AB
SPECIAL REQUESTS: NORMAL

## 2017-06-15 LAB — CORTISOL: CORTISOL PLASMA: 16.4 ug/dL

## 2017-06-15 LAB — MAGNESIUM: MAGNESIUM: 1.9 mg/dL (ref 1.7–2.4)

## 2017-06-15 LAB — AMMONIA: AMMONIA: 10 umol/L (ref 9–35)

## 2017-06-15 MED ORDER — POTASSIUM CHLORIDE CRYS ER 20 MEQ PO TBCR
40.0000 meq | EXTENDED_RELEASE_TABLET | Freq: Once | ORAL | Status: AC
  Administered 2017-06-15: 40 meq via ORAL
  Filled 2017-06-15: qty 2

## 2017-06-15 MED ORDER — CEPHALEXIN 500 MG PO CAPS: 500.0000 mg | ORAL_CAPSULE | Freq: Two times a day (BID) | ORAL | 0 refills | Status: AC

## 2017-06-15 MED ORDER — CEPHALEXIN 500 MG PO CAPS
500.0000 mg | ORAL_CAPSULE | Freq: Two times a day (BID) | ORAL | Status: DC
  Administered 2017-06-15: 500 mg via ORAL

## 2017-06-15 NOTE — NC FL2 (Signed)
[place] MEDICAID FL2 LEVEL OF CARE SCREENING TOOL     IDENTIFICATION  Patient Name: Toni Sullivan Birthdate: 1950/10/31 Sex: female Admission Date (Current Location): 06/12/2017  [facility] and [place] Number:  Producer, television/film/video and Address:  [facility],  [address] [place] [address]      Provider Number: [id number]  Attending Physician Name and Address:  Albertine Grates, MD  Relative Name and Phone Number:       Current Level of Care: Hospital Recommended Level of Care: Assisted Living Facility Prior Approval Number:    Date Approved/Denied:   PASRR Number:    Discharge Plan: Other (Comment) (return to [facility])    Current Diagnoses: Patient Active Problem List   Diagnosis Date Noted  . UTI (urinary tract infection) 06/12/2017  . Delirium 06/12/2017  . Aggressive behavior   . Urinary tract infection without hematuria   . Acute metabolic encephalopathy 02/03/2017  . Acute lower UTI 02/03/2017  . 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 History of CVA (cerebrovascular accident) 07/03/2013    Orientation RESPIRATION BLADDER Height & Weight     Self  Normal Incontinent Weight: 142 lb (64.4 kg) Height:  5' (152.4 cm)  BEHAVIORAL SYMPTOMS/MOOD NEUROLOGICAL BOWEL NUTRITION STATUS      Incontinent Diet (resume previous diet at facility)  AMBULATORY STATUS COMMUNICATION OF NEEDS Skin   Supervision Verbally Normal                       Personal Care Assistance Level of Assistance  Bathing, Feeding, Dressing Bathing Assistance: Independent Feeding assistance: Independent Dressing Assistance: Independent     Functional Limitations Info  Sight, Hearing, Speech Sight Info: Adequate Hearing Info: Adequate Speech Info: Adequate    SPECIAL CARE FACTORS FREQUENCY                       Contractures Contractures Info: Not present    Additional Factors Info  Code Status,  Allergies Code Status Info: DNR Allergies Info: Penicillins           Current Medications (06/15/2017):  This is the current hospital active medication list Current Facility-Administered Medications  Medication Dose Route Frequency Provider Last Rate Last Dose  . 0.9 %  sodium chloride infusion   Intravenous Continuous Zigmund Daniel., MD 75 mL/hr at 06/15/17 [phone]    . acetaminophen (TYLENOL) tablet 650 mg  650 mg Oral Q8H PRN Zigmund Daniel., MD      . cephALEXin [facility]) capsule 500 mg  500 mg Oral Q12H Albertine Grates, MD   500 mg at 06/15/17 1126  . divalproex (DEPAKOTE) DR tablet 125 mg  125 mg Oral TID WC Zigmund Daniel., MD   125 mg at 06/15/17 1120  . enoxaparin (LOVENOX) injection 40 mg  40 mg Subcutaneous Q24H Zigmund Daniel., MD   40 mg at 06/14/17 2010  . haloperidol (HALDOL) tablet 0.5 mg  0.5 mg Oral QHS Zigmund Daniel., MD   0.5 mg at 06/14/17 2200  . LORazepam (ATIVAN) tablet 0.5 mg  0.5 mg Oral Q6H PRN Zigmund Daniel., MD   0.5 mg at 06/14/17 1523  . mirtazapine (REMERON) tablet 15 mg  15 mg Oral QHS Zigmund Daniel., MD   15 mg at 06/14/17 2010  . rivastigmine (EXELON) 13.3 MG/24HR 13.3 mg  13.3 mg Transdermal Daily Lowell Guitar, A  Charlyn Minervaaldwell Jr., MD   13.3 mg at 06/15/17 0900  . sertraline (ZOLOFT) tablet 100 mg  100 mg Oral Daily Zigmund DanielPowell, A Caldwell Jr., MD   100 mg at 06/15/17 1055     Discharge Medications: TAKE these medications   acetaminophen 325 MG tablet Commonly known as:  TYLENOL Take 650 mg by mouth every 8 (eight) hours as needed (for pain level 1-8 or temperature greater than 101 degrees).   cephALEXin 500 MG capsule Commonly known as:  KEFLEX Take 1 capsule (500 mg total) by mouth every 12 (twelve) hours.   divalproex 125 MG DR tablet Commonly known as:  DEPAKOTE Take 125 mg by mouth 3 (three) times daily with meals.   haloperidol 0.5 MG tablet Commonly known as:  HALDOL Take 0.5 mg by mouth at bedtime.    LORazepam 0.5 MG tablet Commonly known as:  ATIVAN Take 0.5 mg by mouth every 6 (six) hours as needed for anxiety.   mirtazapine 15 MG tablet Commonly known as:  REMERON Take 15 mg by mouth at bedtime.   rivastigmine 13.3 MG/24HR Commonly known as:  EXELON Apply 1 patch (13.3 mg total) topically daily.   sertraline 100 MG tablet Commonly known as:  ZOLOFT Take 100 mg by mouth daily.    Relevant Imaging Results:  Relevant Lab Results:   Additional Information [id number];  Raye SorrowCoble, Hannah N, [place]

## 2017-06-15 NOTE — Progress Notes (Signed)
LCSW following for disposition:  Patient to return to ALF this evening Husband in room with patient, plans to transport patient back.  All documents for discharge sent through [facility]. FL2 updated with medications and information.  No other needs.  DC back to ALF today.  Deretha EmoryHannah Jackson Coffield LCSW, MSW Clinical Social Work: Optician, dispensingystem Wide Float Coverage for :  [phone]

## 2017-06-15 NOTE — Consult Note (Signed)
   [facility] Consult   06/15/2017  Toni Sullivan 12/25/1950 [id number]   [facility].   Chart reviewed. Noted patient to return to ALF.   No identifiable [facility] needs at this time.    Raiford NobleAtika Hall, MSN-Ed, RN,BSN [facility] Care Management Hospital Liaison [phone]

## 2017-06-15 NOTE — Clinical Social Work Note (Signed)
Clinical Social Work Assessment  Patient Details  Name: Toni LibraBonnie G Fahringer MRN: [id number] Date of Birth: 08/05/1951  Date of referral:  06/15/17               Reason for consult:  Discharge Planning                Permission sought to share information with:  Case Manager, Facility Medical sales representativeContact Representative, Family Supports Permission granted to share information::     Name::     Theodis BlazeWallace Hillary  Agency[facility]   Relationship::  Husband  Contact Information:     Housing/Transportation Living arrangements for the past 2 months:  Assisted Living Source of Information:  Guardian Earlene Plater([name], Spouse) Patient Interpreter Needed:  None Criminal Activity/Legal Involvement Pertinent to Current Situation/Hospitalization:  No - Comment as needed Significant Relationships:  Spouse, Phelps DodgeCommunity Support Lives with:  Facility Resident Do you feel safe going back to the place where you live?  Yes Need for family participation in patient care:  Yes (Comment)  Care giving concerns:  No concerns addressed at this time. [facility] [place]. Spouse and facility are agreeable to patient returning to the facility.    Social Worker assessment / plan:  LCSW consulted [facility].   Patient was admitted for UTI. [facility]   Her [facility]. [facility].  PLAN: Return to ALF  Employment status:  Retired Health and safety inspectornsurance information:  Managed Care PT Recommendations:  Skilled Nursing Facility Information / Referral to community resources:  Skilled Nursing Facility  Patient/Family's Response to care:  Spouse is agreeable to SN[facility] placement [facility].   Patient/Family's Understanding of and Emotional Response to Diagnosis, Current Treatment, and Prognosis:   Husband reports  that he is no longer able to care for her and [facility].    Emotional Assessment Appearance:  Appears older than stated age Attitude/Demeanor/Rapport:    Affect (typically observed):  Calm, Quiet Orientation:  Oriented to Self Alcohol / Substance use:  Not Applicable Psych involvement (Current and /or in the community):  No (Comment)  Discharge Needs  Concerns to be addressed:  No discharge needs identified Readmission within the last 30 days:  Yes Current discharge risk:  None Barriers to Discharge:  No Barriers Identified   Coralyn HellingBernette Brevan Luberto, LCSW 06/15/2017, 12:10 PM

## 2017-06-15 NOTE — Progress Notes (Signed)
[facility] at this [facility]. Husband transporting patient. Prescriptions and paper work provided.

## 2017-06-15 NOTE — Discharge Summary (Signed)
Discharge Summary  Toni Sullivan ZOX:[id number] DOB: [birth year]  PCP: Excell Seltzer, MD  Admit date: 06/12/2017 Discharge date: 06/15/2017  Time spent: >79mins, more than 50% time spent on coordination of care.  Recommendations for Outpatient Follow-up:  1. F/u with PMD within a week  for hospital discharge follow up, repeat cbc/bmp at follow up  Discharge Diagnoses:  Active Hospital Problems   Diagnosis Date Noted  . UTI (urinary tract infection) 06/12/2017  . Delirium 06/12/2017  . Aggressive behavior   . Acute lower UTI 02/03/2017  . 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    Discharge Condition: stable  Diet recommendation: dysphagia diet 1, thin liquid  Filed Weights   06/12/17 1012  Weight: 64.4 kg (142 lb)    History of present illness:  PCP: Excell Seltzer, MD  Patient coming from: Toni Sullivan  I have personally briefly reviewed patient's old medical records in [facility]  Chief Complaint: combative, AMS  HPI: Toni Sullivan is a 66 y.o. female with medical history significant of dementia who presents from [place] due to increased combativeness behavior.  History is limited due to the patient's mental status. Per chart review and after discussing with staff, the patient has been increasingly combative. She was kicking and hitting staff and try to get out of bed. There been no obvious symptoms of infection.  No fevers.  ED Course: Labs, antibiotics, haldol.  Admit for UTI.   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 Patient is reported to be aggressive with hitting and kicking staff. Patient is trying to get out of bed repeatedly. -Urine culture with [name] ornithinolytica (resistant to ampicillin, sensitive to the rest of abx tested). Patient does not have fever, no leukocytosis,  -Patient improved on Ceftriaxone and hydration -she is discharged on kefelex to finish treatment course.   Hypernatremia: -which could also contribute to aggressive behavior Likely from dehydration from poor oral intake -Improved with IVF hydration -sodium 147 on presentation, sodium 138 at discharge -encourage oral intake.   Hypokalemia: -Replaced , mag 1.9   Anemia:nomocytic, no sign of bleeding -mild, continue to monitor -hgb >10  Dementia: -she was hospitalized at a psychiatry hospital for a month from 8/11 to 9/24 due to dementia with behavioal distrubance -I have reviewed outside record, per psych discharge summary, patient is discharged on increased dose of zoloft, seroquel was discontinued. She is continued on mirtazapine, depakote, exelon, she is also on haldol qhs and ativan prn -Pt is continued onhome meds (depakote, ativan, haldol, rivastigmine)    DVT prophylaxis while in the hospital:Lovenox subQ  Code Status: DNR  Family Communication: patient and husband at bedside  Disposition Plan: return to ALF   Consultants:  none  Procedures:  none  Antibiotics:  rocephin   Discharge Exam: BP 109/69 (BP Location: Right Arm)   Pulse 63   Temp 97.6 F (36.4 C) (Axillary)   Resp 18   Ht 5' (1.524 m)   Wt 64.4 kg (142 lb)   SpO2 97%   BMI 27.73 kg/m   General: NAD, cooperative, only  oriented to self Cardiovascular: RRR Respiratory: CTABL  Discharge Instructions You were cared for by a hospitalist during your hospital stay. If you have any questions about your discharge medications or the care you received while you were in the  hospital after you are discharged, you can call the unit and asked to speak with the hospitalist on call if the hospitalist that took care of you is not available. Once you are discharged, your primary care physician will handle any further medical issues. Please note that NO REFILLS for any discharge medications will be authorized once you are discharged, as it is imperative that you return to your primary care physician (or establish a relationship with a primary care physician if you do not have one) for your aftercare needs so that they can reassess your need for medications and monitor your lab values.  Discharge Instructions    Diet general    Complete by:  As directed    Increase activity slowly    Complete by:  As directed      Allergies as of 06/15/2017      Reactions   Penicillins Other (See Comments)   Unknown reaction Has patient had a PCN reaction causing immediate rash, facial/tongue/throat swelling, SOB or lightheadedness with hypotension: Unknown Has patient had a PCN reaction causing severe rash involving mucus membranes or skin necrosis: Unknown Has patient had a PCN reaction that required hospitalization: Unknown Has patient had a PCN reaction occurring within the last 10 years: No If all of the above answers are "NO", then may proceed with Cephalosporin use.      Medication List    TAKE these medications   acetaminophen 325 MG tablet Commonly known as:  TYLENOL Take 650 mg by mouth every 8 (eight) hours as needed (for pain level 1-8 or temperature greater than 101 degrees).   cephALEXin 500 MG capsule Commonly known as:  KEFLEX Take 1 capsule (500 mg total) by mouth every 12 (twelve) hours.   divalproex 125 MG DR tablet Commonly known as:  DEPAKOTE Take 125 mg by mouth 3 (three) times daily with meals.   haloperidol 0.5 MG tablet Commonly known as:  HALDOL Take 0.5 mg by mouth at bedtime.   LORazepam 0.5 MG tablet Commonly known as:  ATIVAN Take 0.5 mg by  mouth every 6 (six) hours as needed for anxiety.   mirtazapine 15 MG tablet Commonly known as:  REMERON Take 15 mg by mouth at bedtime.   rivastigmine 13.3 MG/24HR Commonly known as:  EXELON Apply 1 patch (13.3 mg total) topically daily.   sertraline 100 MG tablet Commonly known as:  ZOLOFT Take 100 mg by mouth daily.      Allergies  Allergen Reactions  . Penicillins Other (See Comments)    Unknown reaction Has patient had a PCN reaction causing immediate rash, facial/tongue/throat swelling, SOB or lightheadedness with hypotension: Unknown Has patient had a PCN reaction causing severe rash involving mucus membranes or skin necrosis: Unknown Has patient had a PCN reaction that required hospitalization: Unknown Has patient had a PCN reaction occurring within the last 10 years: No If all of the above answers are "NO", then may proceed with Cephalosporin use.     Contact information for follow-up providers    Excell Seltzer, MD Follow up.   Specialty:  Family Medicine Contact information: [address] [place] [address] [phone]            Contact information for after-discharge care  Destination    [facility] Follow up.   Specialty:  Assisted Living Facility Contact information: [address] [place] [address] [phone]                   The results of significant diagnostics from this hospitalization (including imaging, microbiology, ancillary and laboratory) are listed below for reference.    Significant Diagnostic Studies: Dg Thoracic Spine 2 View  Result Date: 05/30/2017 CLINICAL DATA:  Initial evaluation for acute trauma, fall. EXAM: THORACIC SPINE 2 VIEWS COMPARISON:  None. FINDINGS: Straightening of the normal thoracic kyphosis with minimal levoscoliosis. No listhesis or subluxation. Vertebral body heights maintained. No acute fracture or malalignment. 3.5 cm calcified gallstone noted. No acute soft  tissue abnormality. Visualized heart and lungs unremarkable. IMPRESSION: 1. No radiographic evidence for acute traumatic injury within the thoracic spine. 2. 3.5 cm calcified gallstone. Electronically Signed   By: Rise Mu M.D.   On: 05/30/2017 17:10   Ct Head Wo Contrast  Result Date: 05/30/2017 CLINICAL DATA:  Initial evaluation for acute trauma, fall. EXAM: CT HEAD WITHOUT CONTRAST CT CERVICAL SPINE WITHOUT CONTRAST TECHNIQUE: Multidetector CT imaging of the head and cervical spine was performed following the standard protocol without intravenous contrast. Multiplanar CT image reconstructions of the cervical spine were also generated. COMPARISON:  None. FINDINGS: CT HEAD FINDINGS Brain: Temporal lobe predominant cerebral atrophy. Moderate chronic microvascular ischemic disease. No acute intracranial hemorrhage. No evidence for acute large vessel territory infarct. No mass lesion, midline shift or mass effect. Ventricular prominence related to global parenchymal volume loss without hydrocephalus. No extra-axial fluid collection. Vascular: No hyperdense vessel. Scattered vascular calcifications noted within the carotid siphons. Skull: Scalp soft tissues and calvarium within normal limits. Sinuses/Orbits: The globes and orbital soft tissues within normal limits. Visualized paranasal sinuses are clear. No mastoid effusion. Other: None. CT CERVICAL SPINE FINDINGS Alignment: Straightening of the normal cervical lordosis. Trace anterolisthesis of C7 on T1. Skull base and vertebrae: Skullbase intact. Normal C1-2 articulations are preserved. Dens is intact. Vertebral body heights maintained. No acute fracture. Soft tissues and spinal canal: Soft tissues of the neck demonstrate no acute abnormality. No abnormal prevertebral edema. Disc levels: Moderate multilevel degenerative spondylolysis at C4-5 through C6-7. Right-sided facet arthrosis present at C7-T1. Upper chest: Visualized upper chest within normal  limits. Visualized lung apices are clear. No apical pneumothorax. Other: None. IMPRESSION: CT BRAIN: 1. No acute intracranial process identified. 2. Moderate cerebral atrophy with chronic small vessel ischemic disease. CT CERVICAL SPINE: 1. No acute traumatic injury within the cervical spine. 2. Moderate degenerative spondylolysis at C4-5 through C6-7. Electronically Signed   By: Rise Mu M.D.   On: 05/30/2017 17:08   Ct Cervical Spine Wo Contrast  Result Date: 05/30/2017 CLINICAL DATA:  Initial evaluation for acute trauma, fall. EXAM: CT HEAD WITHOUT CONTRAST CT CERVICAL SPINE WITHOUT CONTRAST TECHNIQUE: Multidetector CT imaging of the head and cervical spine was performed following the standard protocol without intravenous contrast. Multiplanar CT image reconstructions of the cervical spine were also generated. COMPARISON:  None. FINDINGS: CT HEAD FINDINGS Brain: Temporal lobe predominant cerebral atrophy. Moderate chronic microvascular ischemic disease. No acute intracranial hemorrhage. No evidence for acute large vessel territory infarct. No mass lesion, midline shift or mass effect. Ventricular prominence related to global parenchymal volume loss without hydrocephalus. No extra-axial fluid collection. Vascular: No hyperdense vessel. Scattered vascular calcifications noted within the carotid siphons. Skull: Scalp soft tissues and calvarium within normal limits. Sinuses/Orbits: The globes and orbital  soft tissues within normal limits. Visualized paranasal sinuses are clear. No mastoid effusion. Other: None. CT CERVICAL SPINE FINDINGS Alignment: Straightening of the normal cervical lordosis. Trace anterolisthesis of C7 on T1. Skull base and vertebrae: Skullbase intact. Normal C1-2 articulations are preserved. Dens is intact. Vertebral body heights maintained. No acute fracture. Soft tissues and spinal canal: Soft tissues of the neck demonstrate no acute abnormality. No abnormal prevertebral edema.  Disc levels: Moderate multilevel degenerative spondylolysis at C4-5 through C6-7. Right-sided facet arthrosis present at C7-T1. Upper chest: Visualized upper chest within normal limits. Visualized lung apices are clear. No apical pneumothorax. Other: None. IMPRESSION: CT BRAIN: 1. No acute intracranial process identified. 2. Moderate cerebral atrophy with chronic small vessel ischemic disease. CT CERVICAL SPINE: 1. No acute traumatic injury within the cervical spine. 2. Moderate degenerative spondylolysis at C4-5 through C6-7. Electronically Signed   By: Rise MuBenjamin  McClintock M.D.   On: 05/30/2017 17:08    Microbiology: Recent Results (from the past 240 hour(s))  Urine culture     Status: Abnormal   Collection Time: 06/12/17 11:50 AM  Result Value Ref Range Status   Specimen Description URINE, CATHETERIZED  Final   Special Requests Normal  Final   Culture >=100,000 COLONIES/mL KLEBSIELLA ORNITHINOLYTICA (A)  Final   Report Status 06/15/2017 FINAL  Final   Organism ID, Bacteria KLEBSIELLA ORNITHINOLYTICA (A)  Final      Susceptibility   Klebsiella ornithinolytica - MIC*    AMPICILLIN >=32 RESISTANT Resistant     CEFAZOLIN <=4 SENSITIVE Sensitive     CEFTRIAXONE <=1 SENSITIVE Sensitive     CIPROFLOXACIN <=0.25 SENSITIVE Sensitive     GENTAMICIN <=1 SENSITIVE Sensitive     IMIPENEM 1 SENSITIVE Sensitive     NITROFURANTOIN <=16 SENSITIVE Sensitive     TRIMETH/SULFA <=20 SENSITIVE Sensitive     AMPICILLIN/SULBACTAM 4 SENSITIVE Sensitive     PIP/TAZO <=4 SENSITIVE Sensitive     * >=100,000 COLONIES/mL KLEBSIELLA ORNITHINOLYTICA  MRSA PCR Screening     Status: None   Collection Time: 06/13/17  7:20 AM  Result Value Ref Range Status   MRSA by PCR NEGATIVE NEGATIVE Final    Comment:        The GeneXpert MRSA Assay (FDA approved for NASAL specimens only), is one component of a comprehensive MRSA colonization surveillance program. It is not intended to diagnose MRSA infection nor to guide  or monitor treatment for MRSA infections.      Labs: Basic Metabolic Panel:  Recent Labs Lab 06/12/17 1200 06/13/17 0613 06/14/17 0650 06/15/17 0648  NA 147* 146* 141 138  K 3.4* 3.8 3.8 3.6  CL 113* 118* 113* 105  CO2 24 23 24 25   GLUCOSE 88 86 90 81  BUN 22* 15 10 7   CREATININE 0.80 0.67 0.68 0.55  CALCIUM 8.5* 7.8* 7.7* 8.0*  MG  --   --   --  1.9   Liver Function Tests:  Recent Labs Lab 06/12/17 1200  AST 29  ALT 21  ALKPHOS 88  BILITOT 0.9  PROT 6.3*  ALBUMIN 3.0*   No results for input(s): LIPASE, AMYLASE in the last 168 hours.  Recent Labs Lab 06/15/17 0648  AMMONIA 10   CBC:  Recent Labs Lab 06/12/17 1200 06/13/17 0613  WBC 8.4 5.8  NEUTROABS 6.2  --   HGB 11.9* 10.6*  HCT 36.2 32.8*  MCV 86.6 87.0  PLT 197 178   Cardiac Enzymes: No results for input(s): CKTOTAL, CKMB, CKMBINDEX, TROPONINI in the last  168 hours. BNP: BNP (last 3 results) No results for input(s): BNP in the last 8760 hours.  ProBNP (last 3 results) No results for input(s): PROBNP in the last 8760 hours.  CBG: No results for input(s): GLUCAP in the last 168 hours.     SignedAlbertine Grates MD, PhD  [facility] 06/15/2017, 2:28 PM

## 2017-06-19 DIAGNOSIS — F0151 Vascular dementia with behavioral disturbance: Secondary | ICD-10-CM | POA: Diagnosis not present

## 2017-06-19 DIAGNOSIS — E875 Hyperkalemia: Secondary | ICD-10-CM | POA: Diagnosis not present

## 2017-06-19 DIAGNOSIS — F419 Anxiety disorder, unspecified: Secondary | ICD-10-CM | POA: Diagnosis not present

## 2017-06-19 DIAGNOSIS — E870 Hyperosmolality and hypernatremia: Secondary | ICD-10-CM | POA: Diagnosis not present

## 2017-06-24 ENCOUNTER — Encounter (HOSPITAL_COMMUNITY): Payer: Self-pay

## 2017-06-24 ENCOUNTER — Emergency Department (HOSPITAL_COMMUNITY)
Admission: EM | Admit: 2017-06-24 | Discharge: 2017-06-24 | Disposition: A | Discharge status: Home / Self Care | Payer: PPO | Attending: Emergency Medicine | Admitting: Emergency Medicine

## 2017-06-24 DIAGNOSIS — W19XXXA Unspecified fall, initial encounter: Secondary | ICD-10-CM | POA: Diagnosis not present

## 2017-06-24 DIAGNOSIS — R4182 Altered mental status, unspecified: Secondary | ICD-10-CM | POA: Diagnosis not present

## 2017-06-24 DIAGNOSIS — Y999 Unspecified external cause status: Secondary | ICD-10-CM | POA: Diagnosis not present

## 2017-06-24 DIAGNOSIS — F29 Unspecified psychosis not due to a substance or known physiological condition: Secondary | ICD-10-CM | POA: Diagnosis not present

## 2017-06-24 DIAGNOSIS — F0390 Unspecified dementia without behavioral disturbance: Secondary | ICD-10-CM | POA: Diagnosis not present

## 2017-06-24 DIAGNOSIS — G300 Alzheimer's disease with early onset: Secondary | ICD-10-CM | POA: Diagnosis not present

## 2017-06-24 DIAGNOSIS — G8911 Acute pain due to trauma: Secondary | ICD-10-CM | POA: Diagnosis not present

## 2017-06-24 DIAGNOSIS — F6389 Other impulse disorders: Secondary | ICD-10-CM | POA: Diagnosis not present

## 2017-06-24 DIAGNOSIS — Z8673 Personal history of transient ischemic attack (TIA), and cerebral infarction without residual deficits: Secondary | ICD-10-CM | POA: Insufficient documentation

## 2017-06-24 DIAGNOSIS — S0990XA Unspecified injury of head, initial encounter: Secondary | ICD-10-CM | POA: Insufficient documentation

## 2017-06-24 DIAGNOSIS — Y939 Activity, unspecified: Secondary | ICD-10-CM | POA: Diagnosis not present

## 2017-06-24 DIAGNOSIS — F0281 Dementia in other diseases classified elsewhere with behavioral disturbance: Secondary | ICD-10-CM | POA: Diagnosis not present

## 2017-06-24 DIAGNOSIS — Y92192 Bathroom in other specified residential institution as the place of occurrence of the external cause: Secondary | ICD-10-CM | POA: Insufficient documentation

## 2017-06-24 DIAGNOSIS — S0090XA Unspecified superficial injury of unspecified part of head, initial encounter: Secondary | ICD-10-CM | POA: Diagnosis not present

## 2017-06-24 NOTE — ED Notes (Signed)
Bed: [facility] Expected date:  Expected time:  Means of arrival:  Comments: 

## 2017-06-24 NOTE — ED Triage Notes (Signed)
[facility] after staff found her on the floor in the bathroom. Staff state pt have a red mark in her head and that why they sent her here. No visible make seen upon pt arrived to the ed. Pt is at her baseline. Hx of dementia.

## 2017-06-24 NOTE — ED Provider Notes (Signed)
[facility]-EMERGENCY DEPT Provider Note   CSN: [id number] Arrival date & time: 06/24/17  1109     History   Chief Complaint No chief complaint on file.   HPI Toni Sullivan is a 66 y.o. female.  HPI Patient is sent from [facility].  She has dementia.  Patient was found on the floor in the bathroom today.  Staff reports they saw a red mark on her head and center to the emergency department for evaluation.  He has been at baseline for her level of alertness and mental status. Past Medical History:  Diagnosis Date  . Headache(784.0)   . Memory loss     Patient Active Problem List   Diagnosis Date Noted  . UTI (urinary tract infection) 06/12/2017  . Delirium 06/12/2017  . Aggressive behavior   . Urinary tract infection without hematuria   . Acute metabolic encephalopathy 02/03/2017  . Acute lower UTI 02/03/2017  . 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 History of CVA (cerebrovascular accident) 07/03/2013    Past Surgical History:  Procedure Laterality Date  . MOUTH SURGERY    . SKIN CANCER EXCISION      OB History    No data available       Home Medications    Prior to Admission medications   Medication Sig Start Date End Date Taking? Authorizing Provider  acetaminophen (TYLENOL) 325 MG tablet Take 650 mg by mouth every 8 (eight) hours as needed (for pain level 1-8 or temperature greater than 101 degrees).   Yes [provider]  divalproex (DEPAKOTE SPRINKLE) 125 MG capsule Take 250 mg by mouth 3 (three) times daily. 06/18/17  Yes [provider]  haloperidol (HALDOL) 0.5 MG tablet Take 0.5 mg by mouth at bedtime.    Yes [provider]  LORazepam (ATIVAN) 0.5 MG tablet Take 0.5 mg by mouth every 6 (six) hours.  05/22/17  Yes [provider]  mirtazapine (REMERON) 15 MG tablet Take 15 mg by mouth at bedtime.   Yes [provider]  Rivastigmine (EXELON) 13.3 MG/24HR PT24 Apply 1 patch (13.3 mg total) topically daily. 09/01/16  Yes Nilda RiggsMartin, Nancy Carolyn, NP  sertraline (ZOLOFT) 100 MG tablet Take 100 mg by mouth daily.   Yes [provider]  cephALEXin (KEFLEX) 500 MG capsule Take 500 mg by mouth 2 (two) times daily.    [provider]  divalproex (DEPAKOTE) 125 MG DR tablet Take 125 mg by mouth 3 (three) times daily with meals. 03/17/17   [provider]    Family History No family history on file.  Social History Social History  Substance Use Topics  . Smoking status: Never Smoker  . Smokeless tobacco: Never Used  . Alcohol use 0.0 oz/week     Comment: 1 glass daily     Allergies   Penicillins   Review of Systems Review of Systems Cannot obtain review of systems level 5 caveat dementia  Physical Exam Updated Vital Signs BP (!) 88/64 (BP Location: Left Arm)   Pulse 67   Temp 98.2 F (36.8 C) (Oral)   Resp 18   Ht 5' (1.524 m)   Wt 64.4 kg (142 lb)   SpO2 99%   BMI 27.73 kg/m   Physical Exam  Constitutional:  Patient is calm and alert.  She does respond to me somewhat in terms of acknowledging that I am speaking to her, however her responses are  completely unrelated to the situation.   HENT:  I do not appreciate any actual head injury.  I have palpated all of her scalp and skull.  There is a mild erythema of the upper lid and brow on the left eye.  No associated hematoma or swelling.  Associated ocular injury.  No other areas of injury.  Patient does not express any type of pain or discomfort with palpation of her cranium.  Neck is palpated as well without any sign of discomfort.  Eyes: Pupils are equal, round, and reactive to light. EOM are normal.  Neck: Neck supple.  Cardiovascular: Normal rate, regular rhythm, normal heart sounds and intact distal pulses.   Pulmonary/Chest: Effort normal and breath sounds normal. She exhibits no tenderness.  No expression of  discomfort with palpation and compression of the chest wall.  No visible bruises  Abdominal: Soft. She exhibits no distension. There is no tenderness. There is no guarding.  Musculoskeletal: Normal range of motion.  Patient does not follow commands for range of motion however she spontaneously uses her arms to rearrange her covers try to get out of the bed.  As well with her legs I can put into range of motion without signs of pain.  She is using them both symmetrically to try to move about on the stretcher.  Neurological: She is alert. No cranial nerve deficit. She exhibits normal muscle tone.  Patient does not interact in terms of communicative speech.  She is mumbling things about other people unrelated to the situation.  She does not seem oriented to her surroundings.  Skin: Skin is warm and dry.       ED Treatments / Results  Labs (all labs ordered are listed, but only abnormal results are displayed) Labs Reviewed - No data to display  EKG  EKG Interpretation None       Radiology No results found.  Procedures Procedures (including critical care time)  Medications Ordered in ED Medications - No data to display   Initial Impression / Assessment and Plan / ED Course  I have reviewed the triage vital signs and the nursing notes.  Pertinent labs & imaging results that were available during my care of the patient were reviewed by me and considered in my medical decision making (see chart for details).      Final Clinical Impressions(s) / ED Diagnoses   Final diagnoses:  Fall, initial encounter  Minor head injury, initial encounter  Objectively, no significant head injury by examination.  Patient appears to be at baseline function.  Of extremity deformity or injury on exam.  Patient will be return to nursing home care for ongoing observation.  New Prescriptions New Prescriptions   No medications on file     Arby Barrette, MD 06/24/17 1323

## 2017-06-24 NOTE — ED Notes (Signed)
[facility] has been called to transport [facility]

## 2017-06-27 DIAGNOSIS — M6281 Muscle weakness (generalized): Secondary | ICD-10-CM | POA: Diagnosis not present

## 2017-06-27 DIAGNOSIS — Y9212 Nursing home as the place of occurrence of the external cause: Secondary | ICD-10-CM | POA: Diagnosis not present

## 2017-06-27 DIAGNOSIS — F323 Major depressive disorder, single episode, severe with psychotic features: Secondary | ICD-10-CM | POA: Diagnosis not present

## 2017-06-29 DIAGNOSIS — M6281 Muscle weakness (generalized): Secondary | ICD-10-CM | POA: Diagnosis not present

## 2017-06-29 DIAGNOSIS — R2681 Unsteadiness on feet: Secondary | ICD-10-CM | POA: Diagnosis not present

## 2017-07-03 DIAGNOSIS — Z87440 Personal history of urinary (tract) infections: Secondary | ICD-10-CM | POA: Diagnosis not present

## 2017-07-03 DIAGNOSIS — F0151 Vascular dementia with behavioral disturbance: Secondary | ICD-10-CM | POA: Diagnosis not present

## 2017-07-03 DIAGNOSIS — F419 Anxiety disorder, unspecified: Secondary | ICD-10-CM | POA: Diagnosis not present

## 2017-07-09 ENCOUNTER — Emergency Department (HOSPITAL_COMMUNITY)
Admission: EM | Admit: 2017-07-09 | Discharge: 2017-07-09 | Disposition: A | Discharge status: Home / Self Care | Payer: PPO | Attending: Emergency Medicine | Admitting: Emergency Medicine

## 2017-07-09 ENCOUNTER — Emergency Department (HOSPITAL_COMMUNITY): Payer: PPO

## 2017-07-09 ENCOUNTER — Encounter (HOSPITAL_COMMUNITY): Payer: Self-pay | Admitting: Nurse Practitioner

## 2017-07-09 DIAGNOSIS — Y999 Unspecified external cause status: Secondary | ICD-10-CM | POA: Insufficient documentation

## 2017-07-09 DIAGNOSIS — F0391 Unspecified dementia with behavioral disturbance: Secondary | ICD-10-CM | POA: Diagnosis not present

## 2017-07-09 DIAGNOSIS — S064X1A Epidural hemorrhage with loss of consciousness of 30 minutes or less, initial encounter: Secondary | ICD-10-CM | POA: Diagnosis not present

## 2017-07-09 DIAGNOSIS — N390 Urinary tract infection, site not specified: Secondary | ICD-10-CM | POA: Diagnosis not present

## 2017-07-09 DIAGNOSIS — Y92129 Unspecified place in nursing home as the place of occurrence of the external cause: Secondary | ICD-10-CM | POA: Insufficient documentation

## 2017-07-09 DIAGNOSIS — S0003XA Contusion of scalp, initial encounter: Secondary | ICD-10-CM | POA: Diagnosis not present

## 2017-07-09 DIAGNOSIS — S098XXA Other specified injuries of head, initial encounter: Secondary | ICD-10-CM | POA: Diagnosis not present

## 2017-07-09 DIAGNOSIS — S0990XA Unspecified injury of head, initial encounter: Secondary | ICD-10-CM | POA: Insufficient documentation

## 2017-07-09 DIAGNOSIS — S199XXA Unspecified injury of neck, initial encounter: Secondary | ICD-10-CM | POA: Diagnosis not present

## 2017-07-09 DIAGNOSIS — Z79899 Other long term (current) drug therapy: Secondary | ICD-10-CM | POA: Diagnosis not present

## 2017-07-09 DIAGNOSIS — Y939 Activity, unspecified: Secondary | ICD-10-CM | POA: Insufficient documentation

## 2017-07-09 DIAGNOSIS — W19XXXA Unspecified fall, initial encounter: Secondary | ICD-10-CM | POA: Insufficient documentation

## 2017-07-09 DIAGNOSIS — R4182 Altered mental status, unspecified: Secondary | ICD-10-CM | POA: Diagnosis not present

## 2017-07-09 NOTE — ED Notes (Signed)
Bed: [id number] Expected date:  Expected time:  Means of arrival:  Comments: 66 yo fall

## 2017-07-09 NOTE — Discharge Instructions (Signed)
RETURN TO ER IMMEDIATELY IF ANY VOMITING, LETHARGY, OR SUDDEN CHANGE IN BEHAVIOR/MENTAL STATUS.

## 2017-07-09 NOTE — ED Notes (Signed)
Patient dressed into a hospital gown and placed on cardiac monitoring.

## 2017-07-09 NOTE — ED Provider Notes (Signed)
[facility]-EMERGENCY DEPT Provider Note   CSN: [id number] Arrival date & time: 07/09/17  1736     History   Chief Complaint Chief Complaint  Patient presents with  . Fall  . Head Injury    HPI Toni Sullivan is a 66 y.o. female.  66 year old female with past medical history including Alzheimer's dementia, CVA who presents with fall.  Patient was brought in by EMS from her nursing facility where they reported an unwitnessed fall this afternoon.  They noted minor trauma to her forehead.  She is at her mental status baseline.  They are unaware of any loss of consciousness.  She is not anticoagulated.  LEVEL 5 CAVEAT DUE TO DEMENTIA   The history is provided by the EMS personnel and the nursing home.  Fall   Head Injury      Past Medical History:  Diagnosis Date  . Headache(784.0)   . Memory loss     Patient Active Problem List   Diagnosis Date Noted  . UTI (urinary tract infection) 06/12/2017  . Delirium 06/12/2017  . Aggressive behavior   . Urinary tract infection without hematuria   . Acute metabolic encephalopathy 02/03/2017  . Acute lower UTI 02/03/2017  . 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 History of CVA (cerebrovascular accident) 07/03/2013    Past Surgical History:  Procedure Laterality Date  . MOUTH SURGERY    . SKIN CANCER EXCISION      OB History    No data available       Home Medications    Prior to Admission medications   Medication Sig Start Date End Date Taking? Authorizing Provider  acetaminophen (TYLENOL) 325 MG tablet Take 650 mg by mouth every 8 (eight) hours as needed (for pain level 1-8 or temperature greater than 101 degrees).    [provider]  cephALEXin (KEFLEX) 500 MG capsule Take 500 mg by mouth 2 (two) times daily.    [provider]  divalproex (DEPAKOTE SPRINKLE) 125 MG capsule Take 250 mg by mouth 3 (three) times daily.  06/18/17   [provider]  divalproex (DEPAKOTE) 125 MG DR tablet Take 125 mg by mouth 3 (three) times daily with meals. 03/17/17   [provider]  haloperidol (HALDOL) 0.5 MG tablet Take 0.5 mg by mouth at bedtime.     [provider]  LORazepam (ATIVAN) 0.5 MG tablet Take 0.5 mg by mouth every 6 (six) hours.  05/22/17   [provider]  mirtazapine (REMERON) 15 MG tablet Take 15 mg by mouth at bedtime.    [provider]  Rivastigmine (EXELON) 13.3 MG/24HR PT24 Apply 1 patch (13.3 mg total) topically daily. 09/01/16   Nilda RiggsMartin, Nancy Carolyn, NP  sertraline (ZOLOFT) 100 MG tablet Take 100 mg by mouth daily.    [provider]    Family History History reviewed. No pertinent family history.  Social History Social History   Tobacco Use  . Smoking status: Never Smoker  . Smokeless tobacco: Never Used  Substance Use Topics  . Alcohol use: Yes    Alcohol/week: 0.0 oz    Comment: 1 glass daily  . Drug use: No     Allergies   Penicillins   Review of Systems Review of Systems  Unable to perform ROS: Dementia     Physical Exam Updated Vital Signs BP 127/80 (BP Location: Right Arm)   Pulse 61   Temp 99 F (37.2 C) (  Oral)   Resp 15   SpO2 98%   Physical Exam  Constitutional: She appears well-developed. No distress.  Frail elderly woman awake, comfortable  HENT:  Head: Normocephalic and atraumatic.  Eyes: Conjunctivae are normal. Pupils are equal, round, and reactive to light.  Neck: Neck supple.  Cardiovascular: Normal rate, regular rhythm and normal heart sounds.  No murmur heard. Pulmonary/Chest: Effort normal and breath sounds normal. She exhibits no tenderness.  Abdominal: Soft. Bowel sounds are normal. She exhibits no distension. There is no tenderness.  Musculoskeletal: Normal range of motion. She exhibits no edema, tenderness or deformity.  No focal joint tenderness or swelling  Neurological: She is alert.    Disoriented, non-sensical speech  Skin: Skin is warm and dry.  Scab L forearm, abrasion central forehead  Nursing note and vitals reviewed.    ED Treatments / Results  Labs (all labs ordered are listed, but only abnormal results are displayed) Labs Reviewed - No data to display  EKG  EKG Interpretation None       Radiology Ct Head Wo Contrast  Result Date: 07/09/2017 CLINICAL DATA:  Unwitnessed fall with frontal hematoma.  Dementia. EXAM: CT HEAD WITHOUT CONTRAST CT CERVICAL SPINE WITHOUT CONTRAST TECHNIQUE: Multidetector CT imaging of the head and cervical spine was performed following the standard protocol without intravenous contrast. Multiplanar CT image reconstructions of the cervical spine were also generated. COMPARISON:  05/30/2017 FINDINGS: CT HEAD FINDINGS Brain: Ventricles and cisterns are within normal. There is age related atrophic change. There is moderate chronic ischemic microvascular disease. There is no mass, mass effect, shift of midline structures or acute hemorrhage. No evidence of acute infarction. Vascular: No hyperdense vessel or unexpected calcification. Skull: Within normal. Sinuses/Orbits: Orbits are normal. Paranasal sinuses are well developed and well aerated as there is mild mucosal membrane thickening involving the sphenoid and right maxillary sinuses. Mastoid air cells are clear. Other: Mild soft tissue swelling over the frontal scalp just left of midline. CT CERVICAL SPINE FINDINGS Alignment: Within normal. Skull base and vertebrae: There is mild spondylosis throughout the cervical spine. Vertebral body heights are normal. There is uncovertebral joint spurring and mild facet arthropathy. Atlantoaxial articulation is within normal. Left-sided neural from narrowing at the C3-4 level and C4-5 levels with bilateral neural foraminal narrowing at the C5-6 and C6-7 levels. No evidence of acute fracture or subluxation. Soft tissues and spinal canal: No prevertebral  fluid or swelling. No visible canal hematoma. Disc levels: Moderate disc space narrowing from the C3-4 level to the C6-7 level. Upper chest: Unremarkable. Other: None. IMPRESSION: No acute intracranial findings. Chronic ischemic microvascular disease and age related atrophy. Small frontal scalp contusion.  No fracture. No acute cervical spine injury. Moderate spondylosis throughout the cervical spine with moderate multilevel disc disease and moderate multilevel bilateral neural foraminal narrowing. Mild chronic sinus inflammatory change. Electronically Signed   By: Elberta Fortisaniel  Boyle M.D.   On: 07/09/2017 19:15   Ct Cervical Spine Wo Contrast  Result Date: 07/09/2017 CLINICAL DATA:  Unwitnessed fall with frontal hematoma.  Dementia. EXAM: CT HEAD WITHOUT CONTRAST CT CERVICAL SPINE WITHOUT CONTRAST TECHNIQUE: Multidetector CT imaging of the head and cervical spine was performed following the standard protocol without intravenous contrast. Multiplanar CT image reconstructions of the cervical spine were also generated. COMPARISON:  05/30/2017 FINDINGS: CT HEAD FINDINGS Brain: Ventricles and cisterns are within normal. There is age related atrophic change. There is moderate chronic ischemic microvascular disease. There is no mass, mass effect, shift of midline structures or  acute hemorrhage. No evidence of acute infarction. Vascular: No hyperdense vessel or unexpected calcification. Skull: Within normal. Sinuses/Orbits: Orbits are normal. Paranasal sinuses are well developed and well aerated as there is mild mucosal membrane thickening involving the sphenoid and right maxillary sinuses. Mastoid air cells are clear. Other: Mild soft tissue swelling over the frontal scalp just left of midline. CT CERVICAL SPINE FINDINGS Alignment: Within normal. Skull base and vertebrae: There is mild spondylosis throughout the cervical spine. Vertebral body heights are normal. There is uncovertebral joint spurring and mild facet  arthropathy. Atlantoaxial articulation is within normal. Left-sided neural from narrowing at the C3-4 level and C4-5 levels with bilateral neural foraminal narrowing at the C5-6 and C6-7 levels. No evidence of acute fracture or subluxation. Soft tissues and spinal canal: No prevertebral fluid or swelling. No visible canal hematoma. Disc levels: Moderate disc space narrowing from the C3-4 level to the C6-7 level. Upper chest: Unremarkable. Other: None. IMPRESSION: No acute intracranial findings. Chronic ischemic microvascular disease and age related atrophy. Small frontal scalp contusion.  No fracture. No acute cervical spine injury. Moderate spondylosis throughout the cervical spine with moderate multilevel disc disease and moderate multilevel bilateral neural foraminal narrowing. Mild chronic sinus inflammatory change. Electronically Signed   By: Elberta Fortis M.D.   On: 07/09/2017 19:15    Procedures Procedures (including critical care time)  Medications Ordered in ED Medications - No data to display   Initial Impression / Assessment and Plan / ED Course  I have reviewed the triage vital signs and the nursing notes.  Pertinent imaging results that were available during my care of the patient were reviewed by me and considered in my medical decision making (see chart for details).    Unwitnessed fall, h/o dementia, at baseline on arrival. Small forehead abrasion but no other findings of trauma or focal tenderness. CT head and C-spine negative for acute injury.  On repeat exam, patient awake and comfortable with no change in neuro status.  Discharge back to nursing facility with return precautions given in paperwork.   Final Clinical Impressions(s) / ED Diagnoses   Final diagnoses:  Fall, initial encounter  Minor head injury, initial encounter    ED Discharge Orders    None       Little, Ambrose Finland, MD 07/09/17 2008

## 2017-07-09 NOTE — ED Triage Notes (Signed)
[facility] for evaluation post an unwitnessed fall. Mild swelling, similar to a hematoma, noted on her frontal aspect of her head. Reported to be at her dementia baseline, no LOC or anticoagulation therapy.

## 2017-07-10 DIAGNOSIS — F419 Anxiety disorder, unspecified: Secondary | ICD-10-CM | POA: Diagnosis not present

## 2017-07-10 DIAGNOSIS — F0151 Vascular dementia with behavioral disturbance: Secondary | ICD-10-CM | POA: Diagnosis not present

## 2017-07-10 DIAGNOSIS — R296 Repeated falls: Secondary | ICD-10-CM | POA: Diagnosis not present

## 2017-07-11 DIAGNOSIS — F323 Major depressive disorder, single episode, severe with psychotic features: Secondary | ICD-10-CM | POA: Diagnosis not present

## 2017-07-11 DIAGNOSIS — Y9212 Nursing home as the place of occurrence of the external cause: Secondary | ICD-10-CM | POA: Diagnosis not present

## 2017-07-17 DIAGNOSIS — R296 Repeated falls: Secondary | ICD-10-CM | POA: Diagnosis not present

## 2017-07-17 DIAGNOSIS — F419 Anxiety disorder, unspecified: Secondary | ICD-10-CM | POA: Diagnosis not present

## 2017-07-17 DIAGNOSIS — F0151 Vascular dementia with behavioral disturbance: Secondary | ICD-10-CM | POA: Diagnosis not present

## 2017-07-31 DIAGNOSIS — R2681 Unsteadiness on feet: Secondary | ICD-10-CM | POA: Diagnosis not present

## 2017-07-31 DIAGNOSIS — M6281 Muscle weakness (generalized): Secondary | ICD-10-CM | POA: Diagnosis not present

## 2017-08-04 ENCOUNTER — Emergency Department (HOSPITAL_COMMUNITY)
Admission: EM | Admit: 2017-08-04 | Discharge: 2017-08-04 | Disposition: A | Discharge status: Home / Self Care | Payer: PPO | Attending: Emergency Medicine | Admitting: Emergency Medicine

## 2017-08-04 ENCOUNTER — Emergency Department (HOSPITAL_COMMUNITY): Payer: PPO

## 2017-08-04 ENCOUNTER — Other Ambulatory Visit: Payer: Self-pay

## 2017-08-04 ENCOUNTER — Encounter (HOSPITAL_COMMUNITY): Payer: Self-pay

## 2017-08-04 DIAGNOSIS — Y998 Other external cause status: Secondary | ICD-10-CM | POA: Diagnosis not present

## 2017-08-04 DIAGNOSIS — S0083XA Contusion of other part of head, initial encounter: Secondary | ICD-10-CM | POA: Diagnosis not present

## 2017-08-04 DIAGNOSIS — S098XXA Other specified injuries of head, initial encounter: Secondary | ICD-10-CM | POA: Diagnosis not present

## 2017-08-04 DIAGNOSIS — W0110XA Fall on same level from slipping, tripping and stumbling with subsequent striking against unspecified object, initial encounter: Secondary | ICD-10-CM | POA: Diagnosis not present

## 2017-08-04 DIAGNOSIS — Y9389 Activity, other specified: Secondary | ICD-10-CM | POA: Insufficient documentation

## 2017-08-04 DIAGNOSIS — S0003XA Contusion of scalp, initial encounter: Secondary | ICD-10-CM | POA: Diagnosis not present

## 2017-08-04 DIAGNOSIS — S064X0A Epidural hemorrhage without loss of consciousness, initial encounter: Secondary | ICD-10-CM | POA: Diagnosis not present

## 2017-08-04 DIAGNOSIS — W19XXXA Unspecified fall, initial encounter: Secondary | ICD-10-CM

## 2017-08-04 DIAGNOSIS — Z79899 Other long term (current) drug therapy: Secondary | ICD-10-CM | POA: Insufficient documentation

## 2017-08-04 DIAGNOSIS — F0390 Unspecified dementia without behavioral disturbance: Secondary | ICD-10-CM | POA: Insufficient documentation

## 2017-08-04 DIAGNOSIS — Y92129 Unspecified place in nursing home as the place of occurrence of the external cause: Secondary | ICD-10-CM | POA: Diagnosis not present

## 2017-08-04 DIAGNOSIS — S199XXA Unspecified injury of neck, initial encounter: Secondary | ICD-10-CM | POA: Diagnosis not present

## 2017-08-04 DIAGNOSIS — S0990XA Unspecified injury of head, initial encounter: Secondary | ICD-10-CM | POA: Diagnosis present

## 2017-08-04 HISTORY — DX: Unspecified dementia, unspecified severity, without behavioral disturbance, psychotic disturbance, mood disturbance, and anxiety: F03.90

## 2017-08-04 NOTE — ED Notes (Signed)
Bed: WA09 Expected date:  Expected time:  Means of arrival:  Comments: Fall, head injury- no thinners

## 2017-08-04 NOTE — ED Notes (Signed)
Pt was able to ambulate with minor assistance.

## 2017-08-04 NOTE — ED Triage Notes (Signed)
BIB EMS [facility] for [facility] post an unwitnessed fall. Mild swelling, similar to a hematoma, noted on forehead. Dementia baseline, no LOC or anticoagulation therapy. No other obvious injuries or deformities noted.

## 2017-08-04 NOTE — ED Notes (Signed)
Bed: WHALB Expected date:  Expected time:  Means of arrival:  Comments: 

## 2017-08-04 NOTE — ED Provider Notes (Signed)
[facility]-EMERGENCY DEPT Provider Note   CSN: [id number] Arrival date & time: 08/04/17  1559     History   Chief Complaint Chief Complaint  Patient presents with  . Fall    HPI Toni Sullivan is a 66 y.o. female.  The history is provided by the EMS personnel and the spouse. No language interpreter was used.  Fall     Toni Sullivan is a 66 y.o. female who presents to the Emergency Department complaining of fall.  Level V caveat due to dementia.  She had an unw[facility] that happened early today.  She fell forward and struck her head.  She has a history of frequent falls.  Her husband states that she is been in her routine to the health with no fevers, vomiting, medication changes.  She has been eating and drinking without difficulty.  Past Medical History:  Diagnosis Date  . Dementia   . Headache(784.0)   . Memory loss     Patient Active Problem List   Diagnosis Date Noted  . UTI (urinary tract infection) 06/12/2017  . Delirium 06/12/2017  . Aggressive behavior   . Urinary tract infection without hematuria   . Acute metabolic encephalopathy 02/03/2017  . Acute lower UTI 02/03/2017  . 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 History of CVA (cerebrovascular accident) 07/03/2013    Past Surgical History:  Procedure Laterality Date  . MOUTH SURGERY    . SKIN CANCER EXCISION      OB History    No data available       Home Medications    Prior to Admission medications   Medication Sig Start Date End Date Taking? Authorizing Provider  acetaminophen (TYLENOL) 325 MG tablet Take 650 mg by mouth every 8 (eight) hours as needed (for pain level 1-8 or temperature greater than 101 degrees).    [provider]  cephALEXin (KEFLEX) 500 MG capsule Take 500 mg by mouth 2 (two) times daily.    [provider]  divalproex (DEPAKOTE SPRINKLE) 125 MG  capsule Take 250 mg by mouth 3 (three) times daily. 06/18/17   [provider]  divalproex (DEPAKOTE) 125 MG DR tablet Take 125 mg by mouth 3 (three) times daily with meals. 03/17/17   [provider]  haloperidol (HALDOL) 0.5 MG tablet Take 0.5 mg by mouth at bedtime.     [provider]  LORazepam (ATIVAN) 0.5 MG tablet Take 0.5 mg by mouth every 6 (six) hours.  05/22/17   [provider]  mirtazapine (REMERON) 15 MG tablet Take 15 mg by mouth at bedtime.    [provider]  Rivastigmine (EXELON) 13.3 MG/24HR PT24 Apply 1 patch (13.3 mg total) topically daily. 09/01/16   Nilda RiggsMartin, Nancy Carolyn, NP  sertraline (ZOLOFT) 100 MG tablet Take 100 mg by mouth daily.    [provider]    Family History History reviewed. No pertinent family history.  Social History Social History   Tobacco Use  . Smoking status: Never Smoker  . Smokeless tobacco: Never Used  Substance Use Topics  . Alcohol use: Yes    Alcohol/week: 0.0 oz    Comment: 1 glass daily  . Drug use: No     Allergies   Penicillins   Review of Systems Review of Systems  All other systems reviewed and are negative.    Physical Exam Updated Vital Signs BP (!) 129/97 (BP Location:  Right Arm)   Pulse 93   Temp 98.6 F (37 C) (Oral)   Resp 18   SpO2 100%   Physical Exam  Constitutional: She appears well-developed and well-nourished.  HENT:  Head: Normocephalic.  Moderate hematoma to forehead  Cardiovascular: Normal rate and regular rhythm.  No murmur heard. Pulmonary/Chest: Effort normal and breath sounds normal. No respiratory distress.  Abdominal: Soft. There is no tenderness. There is no rebound and no guarding.  Musculoskeletal: She exhibits no edema or tenderness.  Neurological: She is alert.  Disoriented to place and time.  Markedly confused.  MAE symmetrically.    Skin: Skin is warm and dry.  Psychiatric:  mildly agitated  Nursing note and vitals  reviewed.    ED Treatments / Results  Labs (all labs ordered are listed, but only abnormal results are displayed) Labs Reviewed - No data to display  EKG  EKG Interpretation None       Radiology No results found.  Procedures Procedures (including critical care time)  Medications Ordered in ED Medications - No data to display   Initial Impression / Assessment and Plan / ED Course  I have reviewed the triage vital signs and the nursing notes.  Pertinent labs & imaging results that were available during my care of the patient were reviewed by me and considered in my medical decision making (see chart for details).    Patient with history of dementia here for evaluation of injuries following a fall.  She does have a hematoma to her forehead but no evidence of additional injuries.  Imaging is negative for acute intracranial injury, C-spine injury.  She is able to ambulate without difficulty in the department and is at her neurologic baseline according to her husband.  Plan to discharge back to facility with outpatient follow-up and return precautions.  Final Clinical Impressions(s) / ED Diagnoses   Final diagnoses:  Contusion of face, initial encounter  Fall, initial encounter    ED Discharge Orders    None       Tilden Fossaees, Shakelia Scrivner, MD 08/05/17 [phone]

## 2017-08-08 DIAGNOSIS — F323 Major depressive disorder, single episode, severe with psychotic features: Secondary | ICD-10-CM | POA: Diagnosis not present

## 2017-08-08 DIAGNOSIS — Y9212 Nursing home as the place of occurrence of the external cause: Secondary | ICD-10-CM | POA: Diagnosis not present

## 2017-08-14 DIAGNOSIS — F419 Anxiety disorder, unspecified: Secondary | ICD-10-CM | POA: Diagnosis not present

## 2017-08-14 DIAGNOSIS — F0151 Vascular dementia with behavioral disturbance: Secondary | ICD-10-CM | POA: Diagnosis not present

## 2017-08-14 DIAGNOSIS — R296 Repeated falls: Secondary | ICD-10-CM | POA: Diagnosis not present

## 2017-08-14 DIAGNOSIS — R634 Abnormal weight loss: Secondary | ICD-10-CM | POA: Diagnosis not present

## 2017-09-24 ENCOUNTER — Emergency Department (HOSPITAL_COMMUNITY)
Admission: EM | Admit: 2017-09-24 | Discharge: 2017-09-24 | Disposition: A | Discharge status: Skilled Nursing Facility | Payer: PPO | Attending: Emergency Medicine | Admitting: Emergency Medicine

## 2017-09-24 ENCOUNTER — Encounter (HOSPITAL_COMMUNITY): Payer: Self-pay | Admitting: Emergency Medicine

## 2017-09-24 ENCOUNTER — Emergency Department (HOSPITAL_COMMUNITY): Payer: PPO

## 2017-09-24 DIAGNOSIS — Y939 Activity, unspecified: Secondary | ICD-10-CM | POA: Insufficient documentation

## 2017-09-24 DIAGNOSIS — Y999 Unspecified external cause status: Secondary | ICD-10-CM | POA: Diagnosis not present

## 2017-09-24 DIAGNOSIS — S0003XA Contusion of scalp, initial encounter: Secondary | ICD-10-CM | POA: Insufficient documentation

## 2017-09-24 DIAGNOSIS — S199XXA Unspecified injury of neck, initial encounter: Secondary | ICD-10-CM | POA: Diagnosis not present

## 2017-09-24 DIAGNOSIS — S0990XA Unspecified injury of head, initial encounter: Secondary | ICD-10-CM | POA: Diagnosis not present

## 2017-09-24 DIAGNOSIS — Y92129 Unspecified place in nursing home as the place of occurrence of the external cause: Secondary | ICD-10-CM | POA: Diagnosis not present

## 2017-09-24 DIAGNOSIS — S098XXA Other specified injuries of head, initial encounter: Secondary | ICD-10-CM | POA: Diagnosis not present

## 2017-09-24 DIAGNOSIS — W1830XA Fall on same level, unspecified, initial encounter: Secondary | ICD-10-CM | POA: Diagnosis not present

## 2017-09-24 DIAGNOSIS — S0091XA Abrasion of unspecified part of head, initial encounter: Secondary | ICD-10-CM | POA: Diagnosis not present

## 2017-09-24 DIAGNOSIS — F0390 Unspecified dementia without behavioral disturbance: Secondary | ICD-10-CM | POA: Insufficient documentation

## 2017-09-24 DIAGNOSIS — Z79899 Other long term (current) drug therapy: Secondary | ICD-10-CM | POA: Diagnosis not present

## 2017-09-24 DIAGNOSIS — W19XXXA Unspecified fall, initial encounter: Secondary | ICD-10-CM

## 2017-09-24 HISTORY — DX: Cerebral infarction, unspecified: I63.9

## 2017-09-24 MED ORDER — LORAZEPAM 2 MG/ML IJ SOLN
1.0000 mg | Freq: Once | INTRAMUSCULAR | Status: AC
  Administered 2017-09-24: 1 mg via INTRAMUSCULAR

## 2017-09-24 MED ORDER — LORAZEPAM 2 MG/ML IJ SOLN
1.0000 mg | Freq: Once | INTRAMUSCULAR | Status: DC
  Filled 2017-09-24: qty 1

## 2017-09-24 NOTE — ED Triage Notes (Signed)
Per EMS. [facility]. Hx of dementia, confused per norm.  Staff report pt had an unwitnessed fall this afternoon. Pt was done less than an hour. Pt not on blood thinners. Pt has golf ball size hematoma to forehead. Pt has hx of impulsive behavior and wandering. Placed in hall bed due to increased supervision to prevent falls or wandering. Pt denies neck pain. Pt stood up to get on EMS stretcher with no difficulty per EMS.

## 2017-09-24 NOTE — ED Notes (Signed)
Patient transported to CT 

## 2017-09-24 NOTE — Discharge Instructions (Signed)
CT scan of the head reassuring. No head bleed or broken bones.   She should return to the ER if she has a change in her mental status, has vomiting that will not stop or has any new or worsening symptoms.

## 2017-09-24 NOTE — ED Provider Notes (Signed)
[facility]-EMERGENCY DEPT Provider Note   CSN: [id number] Arrival date & time: 09/24/17  1605     History   Chief Complaint Chief Complaint  Patient presents with  . Fall  . Head Injury    HPI Toni Sullivan is a 67 y.o. female.  HPI   Ms. Flom is a 67yo female with a history of dementia, CVA who presents to the emergency department via EMS from [facility] nursing facility for evaluation of unwitnessed fall. Level V caveat applies due to dementia. Per EMS, patient has a history of frequent falls at her nursing facility. Was found on the ground with a large hematoma on her forehead this afternoon. She is not on blood thinners. She stood up and was able to get onto the stretcher independently without assistance. According to her husband at the bedside, patient is at her mental baseline.   Past Medical History:  Diagnosis Date  . Dementia   . Headache(784.0)   . Memory loss   . Stroke [facility])     Patient Active Problem List   Diagnosis Date Noted  . UTI (urinary tract infection) 06/12/2017  . Delirium 06/12/2017  . Aggressive behavior   . Urinary tract infection without hematuria   . Acute metabolic encephalopathy 02/03/2017  . Acute lower UTI 02/03/2017  . 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 History of CVA (cerebrovascular accident) 07/03/2013    Past Surgical History:  Procedure Laterality Date  . MOUTH SURGERY    . SKIN CANCER EXCISION      OB History    No data available       Home Medications    Prior to Admission medications   Medication Sig Start Date End Date Taking? Authorizing Provider  acetaminophen (TYLENOL) 325 MG tablet Take 650 mg by mouth every 8 (eight) hours as needed (for pain level 1-8 or temperature greater than 101 degrees).    [provider]  Cranberry 450 MG TABS Take 450 mg by mouth daily.    [provider]  divalproex (DEPAKOTE  SPRINKLE) 125 MG capsule Take 250 mg by mouth 3 (three) times daily. 06/18/17   [provider]  fluPHENAZine (PROLIXIN) 1 MG tablet Take 1 mg by mouth at bedtime. 08/01/17   [provider]  LORazepam (ATIVAN) 0.5 MG tablet Take 0.5 mg by mouth every 6 (six) hours.  05/22/17   [provider]  mirtazapine (REMERON) 15 MG tablet Take 15 mg by mouth at bedtime.    [provider]  Nutritional Supplements (NUTRITIONAL DRINK PO) Take 12 oz by mouth daily. MIGHTY SHAKES    [provider]  OLANZapine (ZYPREXA) 5 MG tablet Take 5 mg by mouth daily. 07/11/17   [provider]  Rivastigmine (EXELON) 13.3 MG/24HR PT24 Apply 1 patch (13.3 mg total) topically daily. 09/01/16   Nilda Riggs, NP  sertraline (ZOLOFT) 25 MG tablet Take 75 mg by mouth daily. 07/24/17   [provider]    Family History History reviewed. No pertinent family history.  Social History Social History   Tobacco Use  . Smoking status: Never Smoker  . Smokeless tobacco: Never Used  Substance Use Topics  . Alcohol use: Yes    Alcohol/week: 0.0 oz    Comment: 1 glass daily  . Drug use: No     Allergies   Penicillins   Review of Systems Review of Systems  Unable to perform ROS: Dementia  Physical Exam Updated Vital Signs BP (!) 139/94 (BP Location: Right Arm)   Temp 98 F (36.7 C) (Oral)   Resp 18   SpO2 95%   Physical Exam  Constitutional: She appears well-developed and well-nourished. No distress.  Patient moving around in the bed and talking to herself.  HENT:  Head: Normocephalic and atraumatic.  Right Ear: External ear normal.  Left Ear: External ear normal.  Mouth/Throat: Oropharynx is clear and moist. No oropharyngeal exudate.  Large hematoma on the forehead. No battle sign or raccoon eyes. No tenderness over the face or nose.   Eyes: Conjunctivae are normal. Pupils are equal, round, and reactive to light. Right eye exhibits no  discharge. Left eye exhibits no discharge.  EOMs grossly intact.   Neck: Normal range of motion. Neck supple.  No midline cervical spine tenderness.   Cardiovascular: Normal rate, regular rhythm and intact distal pulses. Exam reveals no friction rub.  No murmur heard. Pulmonary/Chest: Effort normal and breath sounds normal. No stridor. No respiratory distress. She has no wheezes. She has no rales.  No chest tenderness.   Abdominal: Soft. Bowel sounds are normal. There is no guarding.  Abdomen nontender.   Musculoskeletal:  Non-tender to palpation over bilateral UE and LE. No midline T-spine or L-spine tenderness.   Lymphadenopathy:    She has no cervical adenopathy.  Neurological: She is alert. Coordination normal.  Disoriented to person, place and situation. PERRL. No apparent facial droop. Speech without dysarthria. Midline tongue extension. Uvula midline. Bilateral grip strength 5/5. Patellar and biceps reflex 1+ bilaterally.   Skin: Skin is warm and dry. Capillary refill takes less than 2 seconds. She is not diaphoretic.  Psychiatric: She has a normal mood and affect. Her behavior is normal.  Nursing note and vitals reviewed.    ED Treatments / Results  Labs (all labs ordered are listed, but only abnormal results are displayed) Labs Reviewed - No data to display  EKG  EKG Interpretation None       Radiology Ct Head Wo Contrast  Result Date: 09/24/2017 CLINICAL DATA:  Unwitnessed fall, found down. History of dementia and confusion. EXAM: CT HEAD WITHOUT CONTRAST CT CERVICAL SPINE WITHOUT CONTRAST TECHNIQUE: Multidetector CT imaging of the head and cervical spine was performed following the standard protocol without intravenous contrast. Multiplanar CT image reconstructions of the cervical spine were also generated. COMPARISON:  Head CT and cervical spine CT dated 08/04/2017. FINDINGS: CT HEAD FINDINGS Brain: Generalized age related parenchymal atrophy with commensurate  dilatation of the ventricles and sulci. Chronic small vessel ischemic changes within the bilateral periventricular and subcortical white matter. No mass, hemorrhage, edema or other evidence of acute parenchymal abnormality. No extra-axial hemorrhage. Vascular: No hyperdense vessel or unexpected calcification. Skull: Normal. Negative for fracture or focal lesion. Sinuses/Orbits: No acute finding. Other: Soft tissue edema/hematoma overlying the lower frontal bones, slightly left of midline. No underlying fracture seen. CT CERVICAL SPINE FINDINGS Alignment: Moderate levoscoliosis. No evidence of acute vertebral body subluxation. Skull base and vertebrae: No fracture line or displaced fracture fragment identified. Facet joints appear intact and appropriately aligned throughout. Soft tissues and spinal canal: No prevertebral fluid or swelling. No visible canal hematoma. Disc levels:  Mild degenerative changes throughout Upper chest: Negative. Other: None IMPRESSION: 1. Soft tissue edema/hematoma overlying the lower frontal bones, slightly left of midline. No underlying fracture seen. 2. No acute intracranial abnormality. No intracranial mass, hemorrhage or edema. 3. No fracture or acute subluxation within the cervical spine. Scoliosis. Electronically  Signed   By: Bary Richard M.D.   On: 09/24/2017 20:25   Ct Cervical Spine Wo Contrast  Result Date: 09/24/2017 CLINICAL DATA:  Unwitnessed fall, found down. History of dementia and confusion. EXAM: CT HEAD WITHOUT CONTRAST CT CERVICAL SPINE WITHOUT CONTRAST TECHNIQUE: Multidetector CT imaging of the head and cervical spine was performed following the standard protocol without intravenous contrast. Multiplanar CT image reconstructions of the cervical spine were also generated. COMPARISON:  Head CT and cervical spine CT dated 08/04/2017. FINDINGS: CT HEAD FINDINGS Brain: Generalized age related parenchymal atrophy with commensurate dilatation of the ventricles and sulci.  Chronic small vessel ischemic changes within the bilateral periventricular and subcortical white matter. No mass, hemorrhage, edema or other evidence of acute parenchymal abnormality. No extra-axial hemorrhage. Vascular: No hyperdense vessel or unexpected calcification. Skull: Normal. Negative for fracture or focal lesion. Sinuses/Orbits: No acute finding. Other: Soft tissue edema/hematoma overlying the lower frontal bones, slightly left of midline. No underlying fracture seen. CT CERVICAL SPINE FINDINGS Alignment: Moderate levoscoliosis. No evidence of acute vertebral body subluxation. Skull base and vertebrae: No fracture line or displaced fracture fragment identified. Facet joints appear intact and appropriately aligned throughout. Soft tissues and spinal canal: No prevertebral fluid or swelling. No visible canal hematoma. Disc levels:  Mild degenerative changes throughout Upper chest: Negative. Other: None IMPRESSION: 1. Soft tissue edema/hematoma overlying the lower frontal bones, slightly left of midline. No underlying fracture seen. 2. No acute intracranial abnormality. No intracranial mass, hemorrhage or edema. 3. No fracture or acute subluxation within the cervical spine. Scoliosis. Electronically Signed   By: Bary Richard M.D.   On: 09/24/2017 20:25    Procedures Procedures (including critical care time)  Medications Ordered in ED Medications  LORazepam (ATIVAN) injection 1 mg (1 mg Intramuscular Given 09/24/17 1805)     Initial Impression / Assessment and Plan / ED Course  I have reviewed the triage vital signs and the nursing notes.  Pertinent labs & imaging results that were available during my care of the patient were reviewed by me and considered in my medical decision making (see chart for details).    CT head/neck reveals soft tissue hematoma overlying frontal bones. No acute intracranial abnormality. No C-spine fracture. Patient's husband states she is at her mental baseline. Plan  to discharge patient to her nursing facility. Discussed this patient with Dr. Effie Shy who also saw the patient and agrees with plan to discharge.   Final Clinical Impressions(s) / ED Diagnoses   Final diagnoses:  Fall, initial encounter  Hematoma of frontal scalp, initial encounter    ED Discharge Orders    None       Lawrence Marseilles 09/25/17 1032    Mancel Bale, MD 09/25/17 1511

## 2017-09-24 NOTE — ED Provider Notes (Signed)
  Face-to-face evaluation   History: Unwitnessed fall. Patient has dementia.  Physical exam: Elderly frail. Contusion right forehead. No facial instability or trismus. Confused, agitated  Medical screening examination/treatment/procedure(s) were conducted as a shared visit with non-physician practitioner(s) and myself.  I personally evaluated the patient during the encounter    Mancel BaleWentz, Khamani Fairley, MD 09/25/17 1512

## 2017-09-24 NOTE — ED Notes (Signed)
Bed: [facility] Expected date:  Expected time:  Means of arrival:  Comments: fall 

## 2017-09-25 DIAGNOSIS — R296 Repeated falls: Secondary | ICD-10-CM | POA: Diagnosis not present

## 2017-09-25 DIAGNOSIS — F0151 Vascular dementia with behavioral disturbance: Secondary | ICD-10-CM | POA: Diagnosis not present

## 2017-09-25 DIAGNOSIS — F419 Anxiety disorder, unspecified: Secondary | ICD-10-CM | POA: Diagnosis not present

## 2017-10-05 ENCOUNTER — Other Ambulatory Visit: Payer: Self-pay

## 2017-10-05 ENCOUNTER — Emergency Department (HOSPITAL_COMMUNITY): Payer: PPO

## 2017-10-05 ENCOUNTER — Emergency Department (HOSPITAL_COMMUNITY)
Admission: EM | Admit: 2017-10-05 | Discharge: 2017-10-06 | Disposition: A | Discharge status: Skilled Nursing Facility | Payer: PPO | Attending: Emergency Medicine | Admitting: Emergency Medicine

## 2017-10-05 ENCOUNTER — Encounter (HOSPITAL_COMMUNITY): Payer: Self-pay

## 2017-10-05 DIAGNOSIS — S0083XA Contusion of other part of head, initial encounter: Secondary | ICD-10-CM | POA: Diagnosis not present

## 2017-10-05 DIAGNOSIS — G300 Alzheimer's disease with early onset: Secondary | ICD-10-CM | POA: Insufficient documentation

## 2017-10-05 DIAGNOSIS — Y92128 Other place in nursing home as the place of occurrence of the external cause: Secondary | ICD-10-CM | POA: Diagnosis not present

## 2017-10-05 DIAGNOSIS — S0990XA Unspecified injury of head, initial encounter: Secondary | ICD-10-CM | POA: Insufficient documentation

## 2017-10-05 DIAGNOSIS — Z79899 Other long term (current) drug therapy: Secondary | ICD-10-CM | POA: Diagnosis not present

## 2017-10-05 DIAGNOSIS — S199XXA Unspecified injury of neck, initial encounter: Secondary | ICD-10-CM | POA: Diagnosis not present

## 2017-10-05 DIAGNOSIS — F0281 Dementia in other diseases classified elsewhere with behavioral disturbance: Secondary | ICD-10-CM | POA: Insufficient documentation

## 2017-10-05 DIAGNOSIS — Y939 Activity, unspecified: Secondary | ICD-10-CM | POA: Insufficient documentation

## 2017-10-05 DIAGNOSIS — W19XXXA Unspecified fall, initial encounter: Secondary | ICD-10-CM | POA: Diagnosis not present

## 2017-10-05 DIAGNOSIS — S064X0A Epidural hemorrhage without loss of consciousness, initial encounter: Secondary | ICD-10-CM | POA: Diagnosis not present

## 2017-10-05 DIAGNOSIS — Y999 Unspecified external cause status: Secondary | ICD-10-CM | POA: Diagnosis not present

## 2017-10-05 DIAGNOSIS — S0003XA Contusion of scalp, initial encounter: Secondary | ICD-10-CM | POA: Diagnosis not present

## 2017-10-05 NOTE — ED Triage Notes (Signed)
[facility]. Hx of dementia, confused per norm. Came in per EMS. Staff report that she fell during shift change. She had a new right upper forehead large lump per EMS. Patient was walking around in room when EMS came to pick her up. She was able to get on the stretcher with assist. Patient denies pain. She is covered with old bruises from previous falls.

## 2017-10-05 NOTE — ED Provider Notes (Signed)
[facility]-EMERGENCY DEPT Provider Note   CSN: [id number] Arrival date & time: 10/05/17  2251     History   Chief Complaint Chief Complaint  Patient presents with  . Fall    HPI Toni Sullivan is a 67 y.o. female.  The history is provided by the nursing home and the EMS personnel. The history is limited by the condition of the patient (level 5 for dementia ).  Fall  This is a recurrent problem. The current episode started 1 to 2 hours ago. The problem occurs constantly. The problem has not changed since onset.Associated symptoms comments: Bruising of the right forehead. Nothing aggravates the symptoms. Nothing relieves the symptoms. She has tried nothing for the symptoms. The treatment provided no relief.    Past Medical History:  Diagnosis Date  . Dementia   . Headache(784.0)   . Memory loss   . Stroke [facility])     Patient Active Problem List   Diagnosis Date Noted  . UTI (urinary tract infection) 06/12/2017  . Delirium 06/12/2017  . Aggressive behavior   . Urinary tract infection without hematuria   . Acute metabolic encephalopathy 02/03/2017  . Acute lower UTI 02/03/2017  . 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 History of CVA (cerebrovascular accident) 07/03/2013    Past Surgical History:  Procedure Laterality Date  . MOUTH SURGERY    . SKIN CANCER EXCISION      OB History    No data available       Home Medications    Prior to Admission medications   Medication Sig Start Date End Date Taking? Authorizing Provider  acetaminophen (TYLENOL) 325 MG tablet Take 650 mg by mouth every 8 (eight) hours as needed (for pain level 1-8 or temperature greater than 101 degrees).    [provider]  Cranberry 450 MG TABS Take 450 mg by mouth daily.    [provider]  divalproex (DEPAKOTE SPRINKLE) 125 MG capsule Take 250 mg by mouth 3 (three) times daily. 06/18/17   [provider]  fluPHENAZine (PROLIXIN) 1 MG tablet Take 1 mg by mouth at bedtime. 08/01/17   [provider]  LORazepam (ATIVAN) 0.5 MG tablet Take 0.5 mg by mouth every 6 (six) hours.  05/22/17   [provider]  mirtazapine (REMERON) 15 MG tablet Take 15 mg by mouth at bedtime.    [provider]  Nutritional Supplements (NUTRITIONAL DRINK PO) Take 12 oz by mouth daily. MIGHTY SHAKES    [provider]  OLANZapine (ZYPREXA) 5 MG tablet Take 5 mg by mouth daily. 07/11/17   [provider]  Rivastigmine (EXELON) 13.3 MG/24HR PT24 Apply 1 patch (13.3 mg total) topically daily. 09/01/16   Nilda Riggs, NP  sertraline (ZOLOFT) 25 MG tablet Take 75 mg by mouth daily. 07/24/17   [provider]    Family History History reviewed. No pertinent family history.  Social History Social History   Tobacco Use  . Smoking status: Never Smoker  . Smokeless tobacco: Never Used  Substance Use Topics  . Alcohol use: Yes    Alcohol/week: 0.0 oz    Comment: 1 glass daily  . Drug use: No     Allergies   Penicillins   Review of Systems Review of Systems  Unable to perform ROS: Dementia  Constitutional: Negative for fever.  Cardiovascular: Negative for leg swelling.  Skin: Negative for wound.     Physical Exam  Updated Vital Signs BP (!) 134/92   Pulse 75   Temp 99.3 F (37.4 C) (Axillary)   Resp (!) 22   SpO2 99%   Physical Exam  Constitutional: She appears well-developed and well-nourished. No distress.  HENT:  Head: Normocephalic. Head is without raccoon's eyes and without Battle's sign.    Right Ear: No mastoid tenderness.  Left Ear: No mastoid tenderness.  Nose: Nose normal.  Mouth/Throat: Oropharynx is clear and moist.  Eyes: Conjunctivae are normal. Pupils are equal, round, and reactive to light.  Neck: No tracheal deviation present.  c-collar in place   Cardiovascular: Normal rate, regular rhythm, normal heart  sounds and intact distal pulses.  Pulmonary/Chest: Effort normal and breath sounds normal. No stridor. She has no wheezes. She has no rales. She exhibits no tenderness.  Abdominal: Soft. Bowel sounds are normal. She exhibits no mass. There is no tenderness. There is no rebound and no guarding.  Pelvis is stable  Musculoskeletal: Normal range of motion. She exhibits no tenderness or deformity.  Hips non tender moving BLE no foreshortening no external rotation  Neurological: She is alert. She displays normal reflexes.  Skin: Skin is warm and dry. Capillary refill takes less than 2 seconds.  Psychiatric:  calm     ED Treatments / Results   Vitals:   10/05/17 2305  BP: (!) 134/92  Pulse: 75  Resp: (!) 22  Temp: 99.3 F (37.4 C)  SpO2: 99%    Radiology No results found.  Procedures Procedures (including critical care time)  Medications Ordered in ED Medications - No data to display    Final Clinical Impressions(s) / ED Diagnoses   Return for weakness, numbness, changes in vision or speech,  fevers > 100.4 unrelieved by medication, shortness of breath, intractable vomiting, or diarrhea, abdominal pain, Inability to tolerate liquids or food, cough, altered mental status or any concerns. No signs of systemic illness or infection. The patient is nontoxic-appearing on exam and vital signs are within normal limits.    I have reviewed the triage vital signs and the nursing notes. Pertinent labs &imaging results that were available during my care of the patient were reviewed by me and considered in my medical decision making (see chart for details).  After history, exam, and medical workup I feel the patient has been appropriately medically screened and is safe for discharge home. Pertinent diagnoses were discussed with the patient. Patient was given return precautions.    Marquist Binstock, MD 10/05/17 2341

## 2017-10-06 NOTE — ED Notes (Signed)
Gave report to [place], [facility] [place]. Left name in case she had any additional questions. Husband will be transporting patient back to the facility.

## 2017-10-06 NOTE — ED Notes (Signed)
Tried calling [facility] at [phone] multiple times to give report for patient coming back to facility. No one at facility is picking up the phone. Will continue trying to call facility.

## 2017-10-09 DIAGNOSIS — F419 Anxiety disorder, unspecified: Secondary | ICD-10-CM | POA: Diagnosis not present

## 2017-10-09 DIAGNOSIS — F0151 Vascular dementia with behavioral disturbance: Secondary | ICD-10-CM | POA: Diagnosis not present

## 2017-10-09 DIAGNOSIS — R296 Repeated falls: Secondary | ICD-10-CM | POA: Diagnosis not present

## 2017-10-12 DIAGNOSIS — R2689 Other abnormalities of gait and mobility: Secondary | ICD-10-CM | POA: Diagnosis not present

## 2017-10-12 DIAGNOSIS — R296 Repeated falls: Secondary | ICD-10-CM | POA: Diagnosis not present

## 2017-10-21 ENCOUNTER — Encounter (HOSPITAL_COMMUNITY): Payer: Self-pay | Admitting: *Deleted

## 2017-10-21 ENCOUNTER — Emergency Department (HOSPITAL_COMMUNITY)
Admission: EM | Admit: 2017-10-21 | Discharge: 2017-10-21 | Disposition: A | Discharge status: Home / Self Care | Payer: PPO | Attending: Emergency Medicine | Admitting: Emergency Medicine

## 2017-10-21 ENCOUNTER — Emergency Department (HOSPITAL_COMMUNITY): Payer: PPO

## 2017-10-21 DIAGNOSIS — Z79899 Other long term (current) drug therapy: Secondary | ICD-10-CM | POA: Diagnosis not present

## 2017-10-21 DIAGNOSIS — S0990XA Unspecified injury of head, initial encounter: Secondary | ICD-10-CM

## 2017-10-21 DIAGNOSIS — F0281 Dementia in other diseases classified elsewhere with behavioral disturbance: Secondary | ICD-10-CM | POA: Insufficient documentation

## 2017-10-21 DIAGNOSIS — Y9389 Activity, other specified: Secondary | ICD-10-CM | POA: Insufficient documentation

## 2017-10-21 DIAGNOSIS — W0110XA Fall on same level from slipping, tripping and stumbling with subsequent striking against unspecified object, initial encounter: Secondary | ICD-10-CM | POA: Diagnosis not present

## 2017-10-21 DIAGNOSIS — Y92129 Unspecified place in nursing home as the place of occurrence of the external cause: Secondary | ICD-10-CM | POA: Insufficient documentation

## 2017-10-21 DIAGNOSIS — R4182 Altered mental status, unspecified: Secondary | ICD-10-CM | POA: Diagnosis not present

## 2017-10-21 DIAGNOSIS — W19XXXA Unspecified fall, initial encounter: Secondary | ICD-10-CM

## 2017-10-21 DIAGNOSIS — G4489 Other headache syndrome: Secondary | ICD-10-CM | POA: Diagnosis not present

## 2017-10-21 DIAGNOSIS — S0101XA Laceration without foreign body of scalp, initial encounter: Secondary | ICD-10-CM | POA: Diagnosis not present

## 2017-10-21 DIAGNOSIS — G308 Other Alzheimer's disease: Secondary | ICD-10-CM | POA: Insufficient documentation

## 2017-10-21 DIAGNOSIS — Y999 Unspecified external cause status: Secondary | ICD-10-CM | POA: Diagnosis not present

## 2017-10-21 DIAGNOSIS — S199XXA Unspecified injury of neck, initial encounter: Secondary | ICD-10-CM | POA: Diagnosis not present

## 2017-10-21 MED ORDER — LIDOCAINE HCL (PF) 1 % IJ SOLN
5.0000 mL | Freq: Once | INTRAMUSCULAR | Status: DC
  Filled 2017-10-21: qty 30

## 2017-10-21 NOTE — Discharge Instructions (Signed)
You are seen in the emergency department for a fall laceration on your scalp.  We did a CAT scan of your head and cervical spine that showed no acute injury.  We did tissue adhesive to the laceration.  We are returning you back to her facility.  I attempted to reach the legal guardian but there was no answer at the phone numbers.  If you would please contact him to let him know.

## 2017-10-21 NOTE — ED Notes (Signed)
Bed: [facility] Expected date:  Expected time:  Means of arrival:  Comments: For 22

## 2017-10-21 NOTE — ED Provider Notes (Signed)
[facility]-EMERGENCY DEPT Provider Note   CSN: [id number] Arrival date & time: 10/21/17  1927     History   Chief Complaint Chief Complaint  Patient presents with  . Fall   Level 5 caveat for dementia. HPI Toni Sullivan is a 67 y.o. female.  67 year old female with history of dementia arrives by EMS from [facility] facility.  Per staff she had an unwitnessed fall striking her head on a table.  Patient has a history of dementia and is unable to provide any history.  She is not on blood thinners.  She does have a history of frequent falls and was actually here earlier this month for another fall.  She resides in a dementia unit.  She has reportedly ambulated since the fall without difficulty.  Staff said her mental status is at baseline.  The history is provided by the EMS personnel.  Fall  This is a recurrent problem. The current episode started 1 to 2 hours ago. The problem has not changed since onset.She has tried nothing for the symptoms. The treatment provided no relief.    Past Medical History:  Diagnosis Date  . Dementia   . Headache(784.0)   . Memory loss   . Stroke [facility])     Patient Active Problem List   Diagnosis Date Noted  . UTI (urinary tract infection) 06/12/2017  . Delirium 06/12/2017  . Aggressive behavior   . Urinary tract infection without hematuria   . Acute metabolic encephalopathy 02/03/2017  . Acute lower UTI 02/03/2017  . 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 History of CVA (cerebrovascular accident) 07/03/2013    Past Surgical History:  Procedure Laterality Date  . MOUTH SURGERY    . SKIN CANCER EXCISION      OB History    No data available       Home Medications    Prior to Admission medications   Medication Sig Start Date End Date Taking? Authorizing Provider  acetaminophen (TYLENOL) 325 MG tablet Take 650 mg by mouth every 8 (eight) hours as needed  (for pain level 1-8 or temperature greater than 101 degrees).    [provider]  Cranberry 450 MG TABS Take 450 mg by mouth daily.    [provider]  divalproex (DEPAKOTE SPRINKLE) 125 MG capsule Take 250 mg by mouth 3 (three) times daily. 06/18/17   [provider]  fluPHENAZine (PROLIXIN) 1 MG tablet Take 1 mg by mouth at bedtime. 08/01/17   [provider]  LORazepam (ATIVAN) 0.5 MG tablet Take 0.5 mg by mouth every 6 (six) hours.  05/22/17   [provider]  mirtazapine (REMERON) 15 MG tablet Take 15 mg by mouth at bedtime.    [provider]  Nutritional Supplements (NUTRITIONAL DRINK PO) Take 12 oz by mouth daily. MIGHTY SHAKES    [provider]  OLANZapine (ZYPREXA) 5 MG tablet Take 5 mg by mouth daily. 07/11/17   [provider]  Rivastigmine (EXELON) 13.3 MG/24HR PT24 Apply 1 patch (13.3 mg total) topically daily. 09/01/16   Nilda Riggs, NP  sertraline (ZOLOFT) 25 MG tablet Take 75 mg by mouth daily. 07/24/17   [provider]    Family History No family history on file.  Social History Social History   Tobacco Use  . Smoking status: Never Smoker  . Smokeless tobacco: Never Used  Substance Use Topics  . Alcohol use: Yes    Alcohol/week:  0.0 oz    Comment: 1 glass daily  . Drug use: No     Allergies   Penicillins   Review of Systems Review of Systems  Unable to perform ROS: Dementia     Physical Exam Updated Vital Signs BP 106/75   Pulse (!) 56   Temp 98.4 F (36.9 C)   Resp 16   SpO2 100%   Physical Exam  Constitutional: She appears well-developed and well-nourished. No distress.  HENT:  Head: Normocephalic.  Right temporal laceration 3 cm no active bleeding no depression.  Eyes: Conjunctivae are normal.  Neck: No tracheal deviation present.  C-collar in place.  Cardiovascular: Normal rate and regular rhythm.  Pulmonary/Chest: Effort normal and breath sounds  normal.  Abdominal: Soft. Bowel sounds are normal.  Musculoskeletal: Normal range of motion. She exhibits no tenderness or deformity.  Neurological: She is alert. GCS eye subscore is 4. GCS verbal subscore is 5. GCS motor subscore is 6.  Skin: Skin is warm and dry. Capillary refill takes less than 2 seconds.  Psychiatric: She has a normal mood and affect.  Nursing note and vitals reviewed.    ED Treatments / Results  Labs (all labs ordered are listed, but only abnormal results are displayed) Labs Reviewed - No data to display  EKG  EKG Interpretation None       Radiology Ct Head Wo Contrast  Result Date: 10/21/2017 CLINICAL DATA:  67 year old female with head and neck injury from fall today. Initial encounter. EXAM: CT HEAD WITHOUT CONTRAST CT CERVICAL SPINE WITHOUT CONTRAST TECHNIQUE: Multidetector CT imaging of the head and cervical spine was performed following the standard protocol without intravenous contrast. Multiplanar CT image reconstructions of the cervical spine were also generated. COMPARISON:  10/05/2017 and prior CTs FINDINGS: CT HEAD FINDINGS Brain: No evidence of acute infarction, hemorrhage, hydrocephalus, extra-axial collection or mass lesion/mass effect. Atrophy and chronic small-vessel white matter ischemic changes are again identified. Vascular: No hyperdense vessel or unexpected calcification. Skull: Normal. Negative for fracture or focal lesion. Sinuses/Orbits: No acute finding. Other: Mild forehead soft tissue swelling noted. CT CERVICAL SPINE FINDINGS Alignment: Normal. Skull base and vertebrae: No acute fracture. No primary bone lesion or focal pathologic process. Soft tissues and spinal canal: No prevertebral fluid or swelling. No visible canal hematoma. Disc levels: Mild multilevel degenerative disc disease and spondylosis noted throughout the cervical spine. Upper chest: No acute abnormality Other: None IMPRESSION: 1. No evidence of acute intracranial  abnormality. Atrophy and chronic small-vessel white matter ischemic changes. 2. Mild forehead soft tissue swelling without fracture. 3. No static evidence of acute injury to the cervical spine. Mild multilevel degenerative changes. Electronically Signed   By: Harmon PierJeffrey  Hu M.D.   On: 10/21/2017 20:39   Ct Cervical Spine Wo Contrast  Result Date: 10/21/2017 CLINICAL DATA:  67 year old female with head and neck injury from fall today. Initial encounter. EXAM: CT HEAD WITHOUT CONTRAST CT CERVICAL SPINE WITHOUT CONTRAST TECHNIQUE: Multidetector CT imaging of the head and cervical spine was performed following the standard protocol without intravenous contrast. Multiplanar CT image reconstructions of the cervical spine were also generated. COMPARISON:  10/05/2017 and prior CTs FINDINGS: CT HEAD FINDINGS Brain: No evidence of acute infarction, hemorrhage, hydrocephalus, extra-axial collection or mass lesion/mass effect. Atrophy and chronic small-vessel white matter ischemic changes are again identified. Vascular: No hyperdense vessel or unexpected calcification. Skull: Normal. Negative for fracture or focal lesion. Sinuses/Orbits: No acute finding. Other: Mild forehead soft tissue swelling noted. CT CERVICAL SPINE FINDINGS  Alignment: Normal. Skull base and vertebrae: No acute fracture. No primary bone lesion or focal pathologic process. Soft tissues and spinal canal: No prevertebral fluid or swelling. No visible canal hematoma. Disc levels: Mild multilevel degenerative disc disease and spondylosis noted throughout the cervical spine. Upper chest: No acute abnormality Other: None IMPRESSION: 1. No evidence of acute intracranial abnormality. Atrophy and chronic small-vessel white matter ischemic changes. 2. Mild forehead soft tissue swelling without fracture. 3. No static evidence of acute injury to the cervical spine. Mild multilevel degenerative changes. Electronically Signed   By: Harmon Pier M.D.   On: 10/21/2017  20:39    Procedures .Marland KitchenLaceration Repair Date/Time: 10/21/2017 9:00 PM Performed by: Terrilee Files, MD Authorized by: Terrilee Files, MD   Consent:    Consent obtained: unable to reach guardian husband. Laceration details:    Location:  Scalp   Length (cm):  1.5 Repair type:    Repair type:  Simple Treatment:    Area cleansed with:  Saline   Amount of cleaning:  Standard Skin repair:    Repair method:  Tissue adhesive Approximation:    Approximation:  Close   Vermilion border: well-aligned   Post-procedure details:    Dressing:  Open (no dressing)   Patient tolerance of procedure:  Tolerated well, no immediate complications   (including critical care time)  Medications Ordered in ED Medications - No data to display   Initial Impression / Assessment and Plan / ED Course  I have reviewed the triage vital signs and the nursing notes.  Pertinent labs & imaging results that were available during my care of the patient were reviewed by me and considered in my medical decision making (see chart for details).  Clinical Course as of Oct 23 1713  Sat Oct 21, 2017  2105 Attempted to reach guardian without any success-her husband and left message on his answering machine.  I feel the patient can return back to the facility otherwise.  [MB]    Clinical Course User Index [MB] Terrilee Files, MD      Final Clinical Impressions(s) / ED Diagnoses   Final diagnoses:  Laceration of scalp, initial encounter  Minor head injury, initial encounter  Fall, initial encounter    ED Discharge Orders    None       Terrilee Files, MD 10/23/17 1715

## 2017-10-21 NOTE — ED Notes (Signed)
Bed: [id number] Expected date:  Expected time:  Means of arrival:  [facility]

## 2017-10-21 NOTE — ED Notes (Signed)
Lac tray and supplies at the bedside.

## 2017-10-21 NOTE — ED Triage Notes (Signed)
[facility]. Pt had fall tonight, striking her right temple on a table. Pt has hx of dementia and per staff at facility, the patient is at baseline. Small lac to the right side of her head, dried blood noted. No blood thinners. Pt with hx of frequent falls. c collar in place on arrival.

## 2017-10-21 NOTE — ED Notes (Signed)
Patients room moved for better visibility by nursing staff.

## 2017-10-23 DIAGNOSIS — R296 Repeated falls: Secondary | ICD-10-CM | POA: Diagnosis not present

## 2017-10-23 DIAGNOSIS — F0151 Vascular dementia with behavioral disturbance: Secondary | ICD-10-CM | POA: Diagnosis not present

## 2017-10-23 DIAGNOSIS — F419 Anxiety disorder, unspecified: Secondary | ICD-10-CM | POA: Diagnosis not present

## 2017-10-26 ENCOUNTER — Other Ambulatory Visit: Payer: Self-pay | Admitting: *Deleted

## 2017-10-26 NOTE — Patient Outreach (Signed)
Called to speak with pt's husband about pt status, living in ALF and frequent ED visits. Request return call.  Zara Councilarroll C. Burgess EstelleSpinks, MSN, Plastic And Reconstructive SurgeonsGNP-BC Gerontological Nurse Practitioner [facility] Care Management [phone]

## 2017-10-27 NOTE — Patient Outreach (Signed)
Addendum: Mr. Dickey GaveMoricle returned my call. We discussed pt's frequency to the ED for falls and if he thought she may need a higher level of care ie nursing home for greater supervision. He told me he had never thought of this. I asked him if she was wheelchair bound and he said she is ambulatory. He said she just trips and falls. I asked him if she is getting therapy and he said yes. He offered to go to the current facility where she is residing and participate in a therapy session and ask the therapist if he thought she needs a higher level of care. (The therapist is not employed by the facility). He will give me a call back next week.  Zara Councilarroll C. Burgess EstelleSpinks, MSN, [facility]GNP-BC Gerontological Nurse Practitioner [facility] Care [facility] [phone]

## 2017-10-31 DIAGNOSIS — R2689 Other abnormalities of gait and mobility: Secondary | ICD-10-CM | POA: Diagnosis not present

## 2017-10-31 DIAGNOSIS — R296 Repeated falls: Secondary | ICD-10-CM | POA: Diagnosis not present

## 2017-11-07 ENCOUNTER — Other Ambulatory Visit: Payer: Self-pay | Admitting: *Deleted

## 2017-11-07 NOTE — Patient Outreach (Signed)
Called and left message for Mr. Dickey GaveMoricle to follow up on his wife's mobility and safety status. I left a message for him to return my call.  Zara Councilarroll C. Burgess EstelleSpinks, MSN, [facility]NP-BC Gerontological Nurse Practitioner [facility] Care Management [phone]

## 2017-11-27 DIAGNOSIS — F0151 Vascular dementia with behavioral disturbance: Secondary | ICD-10-CM | POA: Diagnosis not present

## 2017-11-27 DIAGNOSIS — F419 Anxiety disorder, unspecified: Secondary | ICD-10-CM | POA: Diagnosis not present

## 2017-11-27 DIAGNOSIS — R296 Repeated falls: Secondary | ICD-10-CM | POA: Diagnosis not present

## 2017-12-04 DIAGNOSIS — F419 Anxiety disorder, unspecified: Secondary | ICD-10-CM | POA: Diagnosis not present

## 2017-12-04 DIAGNOSIS — R296 Repeated falls: Secondary | ICD-10-CM | POA: Diagnosis not present

## 2017-12-04 DIAGNOSIS — F0151 Vascular dementia with behavioral disturbance: Secondary | ICD-10-CM | POA: Diagnosis not present

## 2018-01-24 DIAGNOSIS — R569 Unspecified convulsions: Secondary | ICD-10-CM | POA: Diagnosis not present

## 2018-01-24 DIAGNOSIS — D649 Anemia, unspecified: Secondary | ICD-10-CM | POA: Diagnosis not present

## 2018-01-24 DIAGNOSIS — Z79899 Other long term (current) drug therapy: Secondary | ICD-10-CM | POA: Diagnosis not present

## 2018-01-24 DIAGNOSIS — E708 Other disorders of aromatic amino-acid metabolism: Secondary | ICD-10-CM | POA: Diagnosis not present

## 2018-01-24 DIAGNOSIS — E785 Hyperlipidemia, unspecified: Secondary | ICD-10-CM | POA: Diagnosis not present

## 2018-02-05 ENCOUNTER — Emergency Department (HOSPITAL_COMMUNITY)
Admission: EM | Admit: 2018-02-05 | Discharge: 2018-02-05 | Disposition: A | Discharge status: Home / Self Care | Payer: PPO | Attending: Emergency Medicine | Admitting: Emergency Medicine

## 2018-02-05 DIAGNOSIS — Y939 Activity, unspecified: Secondary | ICD-10-CM | POA: Diagnosis not present

## 2018-02-05 DIAGNOSIS — Y999 Unspecified external cause status: Secondary | ICD-10-CM | POA: Insufficient documentation

## 2018-02-05 DIAGNOSIS — W19XXXA Unspecified fall, initial encounter: Secondary | ICD-10-CM | POA: Diagnosis not present

## 2018-02-05 DIAGNOSIS — W1830XA Fall on same level, unspecified, initial encounter: Secondary | ICD-10-CM | POA: Diagnosis not present

## 2018-02-05 DIAGNOSIS — Y92129 Unspecified place in nursing home as the place of occurrence of the external cause: Secondary | ICD-10-CM | POA: Insufficient documentation

## 2018-02-05 DIAGNOSIS — R402441 Other coma, without documented Glasgow coma scale score, or with partial score reported, in the field [EMT or ambulance]: Secondary | ICD-10-CM | POA: Diagnosis not present

## 2018-02-05 DIAGNOSIS — S0990XA Unspecified injury of head, initial encounter: Secondary | ICD-10-CM | POA: Diagnosis not present

## 2018-02-05 DIAGNOSIS — R609 Edema, unspecified: Secondary | ICD-10-CM | POA: Diagnosis not present

## 2018-02-05 DIAGNOSIS — F0281 Dementia in other diseases classified elsewhere with behavioral disturbance: Secondary | ICD-10-CM | POA: Diagnosis not present

## 2018-02-05 DIAGNOSIS — S098XXA Other specified injuries of head, initial encounter: Secondary | ICD-10-CM | POA: Diagnosis present

## 2018-02-05 DIAGNOSIS — G300 Alzheimer's disease with early onset: Secondary | ICD-10-CM | POA: Insufficient documentation

## 2018-02-05 DIAGNOSIS — S0003XA Contusion of scalp, initial encounter: Secondary | ICD-10-CM | POA: Diagnosis not present

## 2018-02-05 DIAGNOSIS — Z8673 Personal history of transient ischemic attack (TIA), and cerebral infarction without residual deficits: Secondary | ICD-10-CM | POA: Insufficient documentation

## 2018-02-05 DIAGNOSIS — Z79899 Other long term (current) drug therapy: Secondary | ICD-10-CM | POA: Diagnosis not present

## 2018-02-05 NOTE — ED Notes (Signed)
ED Provider at bedside. 

## 2018-02-05 NOTE — ED Triage Notes (Signed)
[facility]--witnessed fall, injuring area above right eye. History of dementia, unable to follow commands,and this is patient's baseline. No blood thinners noted in chart.

## 2018-02-05 NOTE — ED Notes (Signed)
Bed: [facility] Expected date:  Expected time:  Means of arrival:  Comments: EMS fall 

## 2018-02-05 NOTE — ED Provider Notes (Signed)
[facility]-EMERGENCY DEPT Provider Note   CSN: [id number] Arrival date & time: 02/05/18  1407     History   Chief Complaint Chief Complaint  Patient presents with  . Fall    HPI Toni Sullivan is a 67 y.o. female.  HPI   67 year old female presenting after a fall.  Unwitnessed.  She has advanced dementia and is unable to provide any useful history.  She is not anticoagulated.  Her husband is at bedside.  He reports that she is more or less at her baseline mental status.  Past Medical History:  Diagnosis Date  . Dementia   . Headache(784.0)   . Memory loss   . Stroke [facility])     Patient Active Problem List   Diagnosis Date Noted  . UTI (urinary tract infection) 06/12/2017  . Delirium 06/12/2017  . Aggressive behavior   . Urinary tract infection without hematuria   . Acute metabolic encephalopathy 02/03/2017  . Acute lower UTI 02/03/2017  . 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 History of CVA (cerebrovascular accident) 07/03/2013    Past Surgical History:  Procedure Laterality Date  . MOUTH SURGERY    . SKIN CANCER EXCISION       OB History   None      Home Medications    Prior to Admission medications   Medication Sig Start Date End Date Taking? Authorizing Provider  acetaminophen (TYLENOL) 325 MG tablet Take 650 mg by mouth every 8 (eight) hours as needed (for pain level 1-8 or temperature greater than 101 degrees).    [provider]  Cranberry 450 MG TABS Take 450 mg by mouth daily.    [provider]  divalproex (DEPAKOTE SPRINKLE) 125 MG capsule Take 250 mg by mouth 3 (three) times daily. 06/18/17   [provider]  fluPHENAZine (PROLIXIN) 1 MG tablet Take 1 mg by mouth at bedtime. 08/01/17   [provider]  LORazepam (ATIVAN) 0.5 MG tablet Take 0.5 mg by mouth every 6 (six) hours.  05/22/17   [provider]  mirtazapine (REMERON)  15 MG tablet Take 15 mg by mouth at bedtime.    [provider]  Nutritional Supplements (NUTRITIONAL DRINK PO) Take 12 oz by mouth daily. MIGHTY SHAKES    [provider]  Rivastigmine (EXELON) 13.3 MG/24HR PT24 Apply 1 patch (13.3 mg total) topically daily. 09/01/16   Nilda RiggsMartin, Nancy Carolyn, NP  sertraline (ZOLOFT) 25 MG tablet Take 75 mg by mouth daily. 07/24/17   [provider]    Family History No family history on file.  Social History Social History   Tobacco Use  . Smoking status: Never Smoker  . Smokeless tobacco: Never Used  Substance Use Topics  . Alcohol use: Yes    Alcohol/week: 0.0 oz    Comment: 1 glass daily  . Drug use: No     Allergies   Penicillins   Review of Systems Review of Systems  Level 5 caveat because of dementia Physical Exam Updated Vital Signs BP 107/75   Pulse 71   Temp (!) 97.5 F (36.4 C) (Oral)   Resp 20   SpO2 98%   Physical Exam  Constitutional: She appears well-developed and well-nourished. No distress.  HENT:  Head: Normocephalic.  Hematoma near the right eyebrow.  No apparent bony tenderness.  She is not following commands reliably but extra ocular muscle function appears to be intact.  No apparent midline spinal  tenderness.  She is up in bed unassisted.  She is moving all extremities with good strength.  Eyes: Conjunctivae are normal. Right eye exhibits no discharge. Left eye exhibits no discharge.  Neck: Neck supple.  Cardiovascular: Normal rate, regular rhythm and normal heart sounds. Exam reveals no gallop and no friction rub.  No murmur heard. Pulmonary/Chest: Effort normal and breath sounds normal. No respiratory distress.  Abdominal: Soft. She exhibits no distension. There is no tenderness.  Musculoskeletal: She exhibits no edema or tenderness.  Neurological: She is alert.  Skin: Skin is warm and dry.  Nursing note and vitals reviewed.    ED Treatments / Results  Labs (all labs ordered  are listed, but only abnormal results are displayed) Labs Reviewed - No data to display  EKG None  Radiology No results found.  Procedures Procedures (including critical care time)  Medications Ordered in ED Medications - No data to display   Initial Impression / Assessment and Plan / ED Course  I have reviewed the triage vital signs and the nursing notes.  Pertinent labs & imaging results that were available during my care of the patient were reviewed by me and considered in my medical decision making (see chart for details).     67 year old female presenting after fall.  Unwitnessed.  Advanced dementia.  She does have a small hematoma around her right eye.  She is at her baseline mental status.  No blood thinners.  Discussed with husband is at bedside.  Imaging was deferred.  He is comfortable with this plan.  Return precautions were discussed.  I doubt skull fracture or clinically significant head injury.  Son states that even she did have some that would require intervention he does not feel that he would actually proceed given her baseline status.  Final Clinical Impressions(s) / ED Diagnoses   Final diagnoses:  Hematoma of scalp, initial encounter    ED Discharge Orders    None       Raeford Razor, MD 02/11/18 1504

## 2018-02-19 DIAGNOSIS — R296 Repeated falls: Secondary | ICD-10-CM | POA: Diagnosis not present

## 2018-02-19 DIAGNOSIS — F419 Anxiety disorder, unspecified: Secondary | ICD-10-CM | POA: Diagnosis not present

## 2018-02-19 DIAGNOSIS — F0151 Vascular dementia with behavioral disturbance: Secondary | ICD-10-CM | POA: Diagnosis not present

## 2018-02-20 ENCOUNTER — Encounter (HOSPITAL_COMMUNITY): Payer: Self-pay

## 2018-02-20 ENCOUNTER — Emergency Department (HOSPITAL_COMMUNITY)
Admission: EM | Admit: 2018-02-20 | Discharge: 2018-02-20 | Disposition: A | Discharge status: Home / Self Care | Payer: PPO | Attending: Emergency Medicine | Admitting: Emergency Medicine

## 2018-02-20 ENCOUNTER — Emergency Department (HOSPITAL_COMMUNITY): Payer: PPO

## 2018-02-20 DIAGNOSIS — R42 Dizziness and giddiness: Secondary | ICD-10-CM | POA: Diagnosis not present

## 2018-02-20 DIAGNOSIS — W19XXXA Unspecified fall, initial encounter: Secondary | ICD-10-CM | POA: Diagnosis not present

## 2018-02-20 DIAGNOSIS — Y9389 Activity, other specified: Secondary | ICD-10-CM | POA: Diagnosis not present

## 2018-02-20 DIAGNOSIS — Z8673 Personal history of transient ischemic attack (TIA), and cerebral infarction without residual deficits: Secondary | ICD-10-CM | POA: Diagnosis not present

## 2018-02-20 DIAGNOSIS — Z23 Encounter for immunization: Secondary | ICD-10-CM | POA: Diagnosis not present

## 2018-02-20 DIAGNOSIS — Z85828 Personal history of other malignant neoplasm of skin: Secondary | ICD-10-CM | POA: Diagnosis not present

## 2018-02-20 DIAGNOSIS — R58 Hemorrhage, not elsewhere classified: Secondary | ICD-10-CM | POA: Diagnosis not present

## 2018-02-20 DIAGNOSIS — R402441 Other coma, without documented Glasgow coma scale score, or with partial score reported, in the field [EMT or ambulance]: Secondary | ICD-10-CM | POA: Diagnosis not present

## 2018-02-20 DIAGNOSIS — F0280 Dementia in other diseases classified elsewhere without behavioral disturbance: Secondary | ICD-10-CM | POA: Diagnosis not present

## 2018-02-20 DIAGNOSIS — S0993XA Unspecified injury of face, initial encounter: Secondary | ICD-10-CM | POA: Diagnosis not present

## 2018-02-20 DIAGNOSIS — Y998 Other external cause status: Secondary | ICD-10-CM | POA: Diagnosis not present

## 2018-02-20 DIAGNOSIS — Y92122 Bedroom in nursing home as the place of occurrence of the external cause: Secondary | ICD-10-CM | POA: Diagnosis not present

## 2018-02-20 DIAGNOSIS — G309 Alzheimer's disease, unspecified: Secondary | ICD-10-CM | POA: Insufficient documentation

## 2018-02-20 DIAGNOSIS — S0101XA Laceration without foreign body of scalp, initial encounter: Secondary | ICD-10-CM | POA: Insufficient documentation

## 2018-02-20 DIAGNOSIS — W0110XA Fall on same level from slipping, tripping and stumbling with subsequent striking against unspecified object, initial encounter: Secondary | ICD-10-CM | POA: Diagnosis not present

## 2018-02-20 DIAGNOSIS — S0990XA Unspecified injury of head, initial encounter: Secondary | ICD-10-CM | POA: Diagnosis not present

## 2018-02-20 DIAGNOSIS — Z79899 Other long term (current) drug therapy: Secondary | ICD-10-CM | POA: Diagnosis not present

## 2018-02-20 DIAGNOSIS — S199XXA Unspecified injury of neck, initial encounter: Secondary | ICD-10-CM | POA: Diagnosis not present

## 2018-02-20 MED ORDER — TETANUS-DIPHTH-ACELL PERTUSSIS 5-2.5-18.5 LF-MCG/0.5 IM SUSP
0.5000 mL | Freq: Once | INTRAMUSCULAR | Status: AC
  Administered 2018-02-20: 0.5 mL via INTRAMUSCULAR
  Filled 2018-02-20: qty 0.5

## 2018-02-20 NOTE — ED Notes (Signed)
Patients husband is here to pick up patient and take patient back to [facility]. [facility] to verify this was ok and was put on hold with no answer. Verified ID of Patients husband with patients chart. [facility] called and canceled.

## 2018-02-20 NOTE — ED Notes (Addendum)
Patient transported to CT.Will irrigate wound on back of head once patient is back from CT.

## 2018-02-20 NOTE — ED Triage Notes (Signed)
Pt arrived via [facility] from [facility] memory care unit. Pt had a fall unknown down time, 15 prior to arrival pt had head against the wall, pool of clotted blood beside her. Patient is at baseline per staff at facility, follows commands. No neurological deficits. Possible laceration to posterior occipital region. No SOB, no dizziness. No significant PMH. Hx of stroke

## 2018-02-20 NOTE — ED Notes (Signed)
Irrigated patient's wound

## 2018-02-20 NOTE — ED Notes (Signed)
[facility] Librarian, academicxecutive director assumes patient went to get out of the bed an probabaly got out the bed and fell. Patient may have tripped over her feet and fell. UGI CorporationDee Dee Barns, Librarian, academicexecutive director. [phone].

## 2018-02-20 NOTE — ED Notes (Signed)
Patient was soiled. New brief placed on patient. Patient given drink and applesauce.

## 2018-02-20 NOTE — ED Notes (Signed)
[facility] Called 

## 2018-02-20 NOTE — Discharge Instructions (Addendum)
RETURN TO ER IF ANY CHANGE IN MENTAL STATUS, VOMITING OR DRAINAGE/REDNESS/WARMTH/SWELLING AT WOUND SITE. SEE PRIMARY CARE PROVIDER IN 1 WEEK FOR STAPLE REMOVAL. TETANUS VACCINATION WAS UPDATED TODAY.

## 2018-02-20 NOTE — ED Notes (Addendum)
Spoke to [place], Charity fundraiserN at [facility] and explained discharge instructions. RN requested us fax discharge instructions to facility. Fax number [phone].

## 2018-02-20 NOTE — ED Notes (Signed)
[facility] to [facility] no answer.

## 2018-02-20 NOTE — ED Notes (Signed)
Bed: WA03 Expected date:  Expected time:  Means of arrival:  Comments: EMS 67 yo unwitnessed fall

## 2018-02-20 NOTE — ED Provider Notes (Signed)
[facility]-EMERGENCY DEPT Provider Note   CSN: [id number] Arrival date & time: 02/20/18  1057     History   Chief Complaint Chief Complaint  Patient presents with  . Fall  . Laceration    to back of the head    HPI Toni Sullivan is a 67 y.o. female.  67 year old female with past mental history including dementia, CVA, frequent falls who presents with fall.  Sometime this morning, the patient had a fall at her nursing facility, was found with her head leaning against the wall and some clotted blood behind her.  Unknown details of the event.  She is at her neurologic baseline currently.  LEVEL 5 CAVEAT DUE TO DEMENTIA  The history is provided by the EMS personnel.  Fall   Laceration      Past Medical History:  Diagnosis Date  . Dementia   . Headache(784.0)   . Memory loss   . Stroke [facility])     Patient Active Problem List   Diagnosis Date Noted  . UTI (urinary tract infection) 06/12/2017  . Delirium 06/12/2017  . Aggressive behavior   . Urinary tract infection without hematuria   . Acute metabolic encephalopathy 02/03/2017  . Acute lower UTI 02/03/2017  . 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 History of CVA (cerebrovascular accident) 07/03/2013    Past Surgical History:  Procedure Laterality Date  . MOUTH SURGERY    . SKIN CANCER EXCISION       OB History   None      Home Medications    Prior to Admission medications   Medication Sig Start Date End Date Taking? Authorizing Provider  acetaminophen (TYLENOL) 325 MG tablet Take 650 mg by mouth every 8 (eight) hours as needed (for pain level 1-8 or temperature greater than 101 degrees).    [provider]  Cranberry 450 MG TABS Take 450 mg by mouth daily.    [provider]  divalproex (DEPAKOTE SPRINKLE) 125 MG capsule Take 250 mg by mouth 3 (three) times daily. 06/18/17   [provider]    fluPHENAZine (PROLIXIN) 1 MG tablet Take 1 mg by mouth at bedtime. 08/01/17   [provider]  LORazepam (ATIVAN) 0.5 MG tablet Take 0.5 mg by mouth every 6 (six) hours.  05/22/17   [provider]  mirtazapine (REMERON) 15 MG tablet Take 15 mg by mouth at bedtime.    [provider]  Nutritional Supplements (NUTRITIONAL DRINK PO) Take 12 oz by mouth daily. MIGHTY SHAKES    [provider]  Rivastigmine (EXELON) 13.3 MG/24HR PT24 Apply 1 patch (13.3 mg total) topically daily. 09/01/16   Nilda Riggs, NP  sertraline (ZOLOFT) 25 MG tablet Take 75 mg by mouth daily. 07/24/17   [provider]    Family History History reviewed. No pertinent family history.  Social History Social History   Tobacco Use  . Smoking status: Never Smoker  . Smokeless tobacco: Never Used  Substance Use Topics  . Alcohol use: Yes    Comment: 1 glass daily  . Drug use: No     Allergies   Penicillins   Review of Systems Review of Systems  Unable to perform ROS: Dementia     Physical Exam Updated Vital Signs BP 110/75 (BP Location: Right Arm)   Pulse (!) 54   Temp 97.6 F (36.4 C) (Axillary)   Resp 17   SpO2 100%   Physical  Exam  Constitutional: She appears well-developed. No distress.  Frail, elderly woman comfortable and awake  HENT:  Head: Normocephalic.  Abrasion L face lateral to eye; ecchymosis and mild swelling near R eye; 2cm linear laceration occipital scalp  Eyes: Pupils are equal, round, and reactive to light. Conjunctivae are normal.  Neck: Neck supple.  Cardiovascular: Normal rate, regular rhythm and normal heart sounds.  No murmur heard. Pulmonary/Chest: Effort normal and breath sounds normal. She exhibits no tenderness.  Abdominal: Soft. Bowel sounds are normal. She exhibits no distension. There is no tenderness.  Musculoskeletal: She exhibits no edema, tenderness or deformity.  Neurological: She is alert.  Non-sensical  speech  Skin: Skin is warm and dry.  Nursing note and vitals reviewed.    ED Treatments / Results  Labs (all labs ordered are listed, but only abnormal results are displayed) Labs Reviewed - No data to display  EKG None  Radiology Ct Head Wo Contrast  Result Date: 02/20/2018 CLINICAL DATA:  Head injury from a fall today with associated bleeding. EXAM: CT HEAD WITHOUT CONTRAST CT MAXILLOFACIAL WITHOUT CONTRAST CT CERVICAL SPINE WITHOUT CONTRAST TECHNIQUE: Multidetector CT imaging of the head, cervical spine, and maxillofacial structures were performed using the standard protocol without intravenous contrast. Multiplanar CT image reconstructions of the cervical spine and maxillofacial structures were also generated. COMPARISON:  Head and cervical spine CT dated 10/21/2017 FINDINGS: CT HEAD FINDINGS Brain: Diffusely enlarged ventricles and subarachnoid spaces. Patchy white matter low density in both cerebral hemispheres. No intracranial hemorrhage, mass lesion or CT evidence of acute infarction. Vascular: No hyperdense vessel or unexpected calcification. Skull: Normal. Negative for fracture or focal lesion. Other: Left sphenoid sinus air-fluid level. CT MAXILLOFACIAL FINDINGS Osseous: No fracture or mandibular dislocation. No destructive process. Orbits: Negative. No traumatic or inflammatory finding. Sinuses: Left sphenoid sinus mucosal thickening and fluid. Soft tissues: Unremarkable. CT CERVICAL SPINE FINDINGS Alignment: The patient's head is tilted to the right producing levoconvex upper cervical scoliosis. There is also mild reversal the normal cervical lordosis. Minimal anterolisthesis at the C2-3 and C3-4 levels. Skull base and vertebrae: No acute fracture. No primary bone lesion or focal pathologic process. Soft tissues and spinal canal: No prevertebral fluid or swelling. No visible canal hematoma. Disc levels: Multilevel degenerative changes. These include facet degenerative changes, including  the C2-3 and C3-4 levels. Upper chest: Clear lung apices. Biapical pleural and parenchymal scarring. Other: None. IMPRESSION: 1. No skull fracture or intracranial hemorrhage. 2. No maxillofacial fracture. 3. No cervical spine fracture or traumatic subluxation. 4. No significant change in diffuse cerebral and cerebellar atrophy and chronic small vessel white matter ischemic changes in both cerebral hemispheres. 5. Stable cervical spine multilevel degenerative changes. 6. Mild chronic and acute sphenoid sinusitis. Electronically Signed   By: Beckie SaltsSteven  Reid M.D.   On: 02/20/2018 13:57   Ct Cervical Spine Wo Contrast  Result Date: 02/20/2018 CLINICAL DATA:  Head injury from a fall today with associated bleeding. EXAM: CT HEAD WITHOUT CONTRAST CT MAXILLOFACIAL WITHOUT CONTRAST CT CERVICAL SPINE WITHOUT CONTRAST TECHNIQUE: Multidetector CT imaging of the head, cervical spine, and maxillofacial structures were performed using the standard protocol without intravenous contrast. Multiplanar CT image reconstructions of the cervical spine and maxillofacial structures were also generated. COMPARISON:  Head and cervical spine CT dated 10/21/2017 FINDINGS: CT HEAD FINDINGS Brain: Diffusely enlarged ventricles and subarachnoid spaces. Patchy white matter low density in both cerebral hemispheres. No intracranial hemorrhage, mass lesion or CT evidence of acute infarction. Vascular: No hyperdense vessel or unexpected  calcification. Skull: Normal. Negative for fracture or focal lesion. Other: Left sphenoid sinus air-fluid level. CT MAXILLOFACIAL FINDINGS Osseous: No fracture or mandibular dislocation. No destructive process. Orbits: Negative. No traumatic or inflammatory finding. Sinuses: Left sphenoid sinus mucosal thickening and fluid. Soft tissues: Unremarkable. CT CERVICAL SPINE FINDINGS Alignment: The patient's head is tilted to the right producing levoconvex upper cervical scoliosis. There is also mild reversal the normal  cervical lordosis. Minimal anterolisthesis at the C2-3 and C3-4 levels. Skull base and vertebrae: No acute fracture. No primary bone lesion or focal pathologic process. Soft tissues and spinal canal: No prevertebral fluid or swelling. No visible canal hematoma. Disc levels: Multilevel degenerative changes. These include facet degenerative changes, including the C2-3 and C3-4 levels. Upper chest: Clear lung apices. Biapical pleural and parenchymal scarring. Other: None. IMPRESSION: 1. No skull fracture or intracranial hemorrhage. 2. No maxillofacial fracture. 3. No cervical spine fracture or traumatic subluxation. 4. No significant change in diffuse cerebral and cerebellar atrophy and chronic small vessel white matter ischemic changes in both cerebral hemispheres. 5. Stable cervical spine multilevel degenerative changes. 6. Mild chronic and acute sphenoid sinusitis. Electronically Signed   By: Beckie Salts M.D.   On: 02/20/2018 13:57   Ct Maxillofacial Wo Cm  Result Date: 02/20/2018 CLINICAL DATA:  Head injury from a fall today with associated bleeding. EXAM: CT HEAD WITHOUT CONTRAST CT MAXILLOFACIAL WITHOUT CONTRAST CT CERVICAL SPINE WITHOUT CONTRAST TECHNIQUE: Multidetector CT imaging of the head, cervical spine, and maxillofacial structures were performed using the standard protocol without intravenous contrast. Multiplanar CT image reconstructions of the cervical spine and maxillofacial structures were also generated. COMPARISON:  Head and cervical spine CT dated 10/21/2017 FINDINGS: CT HEAD FINDINGS Brain: Diffusely enlarged ventricles and subarachnoid spaces. Patchy white matter low density in both cerebral hemispheres. No intracranial hemorrhage, mass lesion or CT evidence of acute infarction. Vascular: No hyperdense vessel or unexpected calcification. Skull: Normal. Negative for fracture or focal lesion. Other: Left sphenoid sinus air-fluid level. CT MAXILLOFACIAL FINDINGS Osseous: No fracture or  mandibular dislocation. No destructive process. Orbits: Negative. No traumatic or inflammatory finding. Sinuses: Left sphenoid sinus mucosal thickening and fluid. Soft tissues: Unremarkable. CT CERVICAL SPINE FINDINGS Alignment: The patient's head is tilted to the right producing levoconvex upper cervical scoliosis. There is also mild reversal the normal cervical lordosis. Minimal anterolisthesis at the C2-3 and C3-4 levels. Skull base and vertebrae: No acute fracture. No primary bone lesion or focal pathologic process. Soft tissues and spinal canal: No prevertebral fluid or swelling. No visible canal hematoma. Disc levels: Multilevel degenerative changes. These include facet degenerative changes, including the C2-3 and C3-4 levels. Upper chest: Clear lung apices. Biapical pleural and parenchymal scarring. Other: None. IMPRESSION: 1. No skull fracture or intracranial hemorrhage. 2. No maxillofacial fracture. 3. No cervical spine fracture or traumatic subluxation. 4. No significant change in diffuse cerebral and cerebellar atrophy and chronic small vessel white matter ischemic changes in both cerebral hemispheres. 5. Stable cervical spine multilevel degenerative changes. 6. Mild chronic and acute sphenoid sinusitis. Electronically Signed   By: Beckie Salts M.D.   On: 02/20/2018 13:57    Procedures .Marland KitchenLaceration Repair Date/Time: 02/20/2018 3:10 PM Performed by: Laurence Spates, MD Authorized by: Laurence Spates, MD   Consent:    Consent obtained:  Emergent situation Anesthesia (see MAR for exact dosages):    Anesthesia method:  None Laceration details:    Location:  Scalp   Scalp location:  Occipital   Length (cm):  2 Repair  type:    Repair type:  Simple Treatment:    Area cleansed with:  Betadine   Amount of cleaning:  Standard Skin repair:    Repair method:  Staples   Number of staples:  3 Approximation:    Approximation:  Close Post-procedure details:    Dressing:  Open (no  dressing)   Patient tolerance of procedure:  Tolerated well, no immediate complications   (including critical care time)  Medications Ordered in ED Medications  Tdap (BOOSTRIX) injection 0.5 mL (0.5 mLs Intramuscular Given 02/20/18 1436)     Initial Impression / Assessment and Plan / ED Course  I have reviewed the triage vital signs and the nursing notes.  Pertinent labs & imaging results that were available during my care of the patient were reviewed by me and considered in my medical decision making (see chart for details).     Alert, no focal areas of tenderness on exam. CT head, c-spine, face negative acute. Chart shows last tetanus 2010, therefore gave tdap. Cleaned wounds and repaired with staples, see procedure note for details.  Patient tolerated well.  She is remained alert and comfortable in the ED.  Nursing facility contacted at time of discharge.  I attempted to contact spouse by phone without success.  Patient discharged in satisfactory condition.  Final Clinical Impressions(s) / ED Diagnoses   Final diagnoses:  Laceration of scalp, initial encounter  Fall, initial encounter    ED Discharge Orders    None       Davie Claud, Ambrose Finland, MD 02/20/18 1512

## 2018-02-22 ENCOUNTER — Emergency Department (HOSPITAL_COMMUNITY)
Admission: EM | Admit: 2018-02-22 | Discharge: 2018-02-22 | Disposition: A | Discharge status: Home / Self Care | Payer: PPO | Attending: Emergency Medicine | Admitting: Emergency Medicine

## 2018-02-22 ENCOUNTER — Encounter (HOSPITAL_COMMUNITY): Payer: Self-pay

## 2018-02-22 ENCOUNTER — Emergency Department (HOSPITAL_COMMUNITY): Payer: PPO

## 2018-02-22 DIAGNOSIS — F0281 Dementia in other diseases classified elsewhere with behavioral disturbance: Secondary | ICD-10-CM | POA: Diagnosis not present

## 2018-02-22 DIAGNOSIS — R52 Pain, unspecified: Secondary | ICD-10-CM | POA: Diagnosis not present

## 2018-02-22 DIAGNOSIS — S199XXA Unspecified injury of neck, initial encounter: Secondary | ICD-10-CM | POA: Diagnosis not present

## 2018-02-22 DIAGNOSIS — I959 Hypotension, unspecified: Secondary | ICD-10-CM | POA: Diagnosis not present

## 2018-02-22 DIAGNOSIS — S0990XA Unspecified injury of head, initial encounter: Secondary | ICD-10-CM | POA: Diagnosis not present

## 2018-02-22 DIAGNOSIS — S0083XA Contusion of other part of head, initial encounter: Secondary | ICD-10-CM | POA: Insufficient documentation

## 2018-02-22 DIAGNOSIS — Z79899 Other long term (current) drug therapy: Secondary | ICD-10-CM | POA: Diagnosis not present

## 2018-02-22 DIAGNOSIS — W19XXXA Unspecified fall, initial encounter: Secondary | ICD-10-CM

## 2018-02-22 DIAGNOSIS — S0003XA Contusion of scalp, initial encounter: Secondary | ICD-10-CM | POA: Diagnosis not present

## 2018-02-22 DIAGNOSIS — W228XXA Striking against or struck by other objects, initial encounter: Secondary | ICD-10-CM | POA: Diagnosis not present

## 2018-02-22 DIAGNOSIS — Y929 Unspecified place or not applicable: Secondary | ICD-10-CM | POA: Diagnosis not present

## 2018-02-22 DIAGNOSIS — Y999 Unspecified external cause status: Secondary | ICD-10-CM | POA: Diagnosis not present

## 2018-02-22 DIAGNOSIS — G308 Other Alzheimer's disease: Secondary | ICD-10-CM | POA: Insufficient documentation

## 2018-02-22 DIAGNOSIS — Y939 Activity, unspecified: Secondary | ICD-10-CM | POA: Insufficient documentation

## 2018-02-22 DIAGNOSIS — R220 Localized swelling, mass and lump, head: Secondary | ICD-10-CM | POA: Diagnosis not present

## 2018-02-22 NOTE — ED Provider Notes (Signed)
[facility]-EMERGENCY DEPT Provider Note   CSN: [id number] Arrival date & time: 02/22/18  1942     History   Chief Complaint No chief complaint on file.   HPI Toni Sullivan is a 67 y.o. female.  Patient fell today and hit her head no loss of consciousness.  Patient has a history of severe dementia  The history is provided by the EMS personnel. No language interpreter was used.  Fall  This is a new problem. The current episode started 6 to 12 hours ago. The problem occurs rarely. The problem has been resolved. Pertinent negatives include no chest pain, no abdominal pain and no headaches. Nothing aggravates the symptoms. Nothing relieves the symptoms. She has tried nothing for the symptoms. The treatment provided no relief.    Past Medical History:  Diagnosis Date  . Dementia   . Headache(784.0)   . Memory loss   . Stroke [facility])     Patient Active Problem List   Diagnosis Date Noted  . UTI (urinary tract infection) 06/12/2017  . Delirium 06/12/2017  . Aggressive behavior   . Urinary tract infection without hematuria   . Acute metabolic encephalopathy 02/03/2017  . Acute lower UTI 02/03/2017  . 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 History of CVA (cerebrovascular accident) 07/03/2013    Past Surgical History:  Procedure Laterality Date  . MOUTH SURGERY    . SKIN CANCER EXCISION       OB History   None      Home Medications    Prior to Admission medications   Medication Sig Start Date End Date Taking? Authorizing Provider  acetaminophen (TYLENOL) 325 MG tablet Take 650 mg by mouth every 8 (eight) hours as needed (for pain level 1-8 or temperature greater than 101 degrees).   Yes [provider]  acetaminophen (TYLENOL) 500 MG tablet Take 500 mg by mouth every 4 (four) hours as needed for fever or headache.   Yes [provider]  alum & mag hydroxide-simeth (MINTOX)  200-200-20 MG/5ML suspension Take 30 mLs by mouth as needed for indigestion or heartburn. Do not exceed 4 doses in 24 hours   Yes [provider]  Cranberry 450 MG TABS Take 450 mg by mouth daily.   Yes [provider]  divalproex (DEPAKOTE SPRINKLE) 125 MG capsule Take 250 mg by mouth 3 (three) times daily. 06/18/17  Yes [provider]  fluPHENAZine (PROLIXIN) 1 MG tablet Take 1 mg by mouth at bedtime. 08/01/17  Yes [provider]  guaifenesin (ROBITUSSIN) 100 MG/5ML syrup Take 200 mg by mouth every 6 (six) hours as needed for cough.   Yes [provider]  loperamide (IMODIUM) 2 MG capsule Take 2 mg by mouth as needed for diarrhea or loose stools. Do not exceed 8 doses in 24 hours   Yes [provider]  LORazepam (ATIVAN) 0.5 MG tablet Take 0.5 mg by mouth every 6 (six) hours.  05/22/17  Yes [provider]  magnesium hydroxide (MILK OF MAGNESIA) 400 MG/5ML suspension Take 30 mLs by mouth at bedtime as needed for mild constipation.   Yes [provider]  Melatonin 3 MG TABS Take 3 mg by mouth at bedtime.   Yes [provider]  mirtazapine (REMERON) 15 MG tablet Take 15 mg by mouth at bedtime.   Yes [provider]  Neomycin-Bacitracin-Polymyxin (TRIPLE ANTIBIOTIC) 3.5-6700035887 OINT Apply 1 application topically as needed (minor skin tears or  abrasions).   Yes [provider]  Nutritional Supplements (NUTRITIONAL DRINK PO) Take 236 mLs by mouth 3 (three) times daily. MIGHTY SHAKES    Yes [provider]  PRESCRIPTION MEDICATION Apply 1 mL topically every 6 (six) hours as needed (severe agitation).   Yes [provider]  risperiDONE (RISPERDAL) 0.5 MG tablet Take 0.5 mg by mouth every 6 (six) hours as needed (severe agitation).  12/01/17  Yes [provider]  Rivastigmine (EXELON) 13.3 MG/24HR PT24 Apply 1 patch (13.3 mg total) topically daily. 09/01/16  Yes Nilda Riggs, NP   sertraline (ZOLOFT) 25 MG tablet Take 75 mg by mouth daily. 07/24/17  Yes [provider]    Family History No family history on file.  Social History Social History   Tobacco Use  . Smoking status: Never Smoker  . Smokeless tobacco: Never Used  Substance Use Topics  . Alcohol use: Yes    Comment: 1 glass daily  . Drug use: No     Allergies   Penicillins   Review of Systems Review of Systems  Constitutional: Negative for appetite change and fatigue.  HENT: Negative for congestion, ear discharge and sinus pressure.   Eyes: Negative for discharge.  Respiratory: Negative for cough.   Cardiovascular: Negative for chest pain.  Gastrointestinal: Negative for abdominal pain and diarrhea.  Genitourinary: Negative for frequency and hematuria.  Musculoskeletal: Negative for back pain.  Skin: Negative for rash.  Neurological: Negative for seizures and headaches.  Psychiatric/Behavioral: Negative for hallucinations.     Physical Exam Updated Vital Signs BP (!) 110/97 (BP Location: Left Arm)   Pulse 78   Temp 97.7 F (36.5 C) (Axillary)   Resp 16   SpO2 100%   Physical Exam  Constitutional: She appears well-developed.  HENT:  Head: Normocephalic.  Swelling to occipital head  Eyes: Conjunctivae and EOM are normal. No scleral icterus.  Neck: Neck supple. No thyromegaly present.  Cardiovascular: Normal rate and regular rhythm. Exam reveals no gallop and no friction rub.  No murmur heard. Pulmonary/Chest: No stridor. She has no wheezes. She has no rales. She exhibits no tenderness.  Abdominal: She exhibits no distension. There is no tenderness. There is no rebound.  Musculoskeletal: Normal range of motion. She exhibits no edema.  Lymphadenopathy:    She has no cervical adenopathy.  Neurological: She is alert. She exhibits normal muscle tone. Coordination normal.  Oriented to person only  Skin: No rash noted. No erythema.     ED Treatments / Results   Labs (all labs ordered are listed, but only abnormal results are displayed) Labs Reviewed - No data to display  EKG None  Radiology Ct Head Wo Contrast  Result Date: 02/22/2018 CLINICAL DATA:  Right forehead hematoma after fall. EXAM: CT HEAD WITHOUT CONTRAST CT CERVICAL SPINE WITHOUT CONTRAST TECHNIQUE: Multidetector CT imaging of the head and cervical spine was performed following the standard protocol without intravenous contrast. Multiplanar CT image reconstructions of the cervical spine were also generated. COMPARISON:  10/21/2017 and 02/20/2018 FINDINGS: CT HEAD FINDINGS Brain: Exam demonstrates age related atrophic change as the ventricles and cisterns are otherwise unchanged. Mild chronic ischemic microvascular disease. No mass, mass effect, shift of midline structures or acute hemorrhage. No evidence of acute infarction. Vascular: No hyperdense vessel or unexpected calcification. Skull: No evidence of fracture. Soft tissue swelling over the right frontal scalp. Skin staples over the right parietal scalp. Sinuses/Orbits: No acute finding. Other: None. CT CERVICAL SPINE FINDINGS Alignment: Within normal. Skull  base and vertebrae: Moderate spondylosis throughout the cervical spine. Vertebral body heights are maintained. Atlantoaxial articulation is within normal. There is uncovertebral joint spurring and facet arthropathy. There is no acute fracture or subluxation. Mild bilateral neural foraminal narrowing at multiple levels due to adjacent bony spurring. Soft tissues and spinal canal: No prevertebral fluid or swelling. No visible canal hematoma. Disc levels: Disc space narrowing at the C3-4, C4-5, C5-6 and C6-7 levels. Upper chest: Negative. Other: None. IMPRESSION: No acute brain injury. No acute cervical spine injury. Focal soft tissue swelling over the right frontal scalp. Laceration right parietal scalp. Chronic ischemic microvascular disease and age related atrophic change. Moderate  spondylosis of the cervical spinal multilevel disc disease and multilevel neural foraminal narrowing. Electronically Signed   By: Elberta Fortis M.D.   On: 02/22/2018 20:56   Ct Cervical Spine Wo Contrast  Result Date: 02/22/2018 CLINICAL DATA:  Right forehead hematoma after fall. EXAM: CT HEAD WITHOUT CONTRAST CT CERVICAL SPINE WITHOUT CONTRAST TECHNIQUE: Multidetector CT imaging of the head and cervical spine was performed following the standard protocol without intravenous contrast. Multiplanar CT image reconstructions of the cervical spine were also generated. COMPARISON:  10/21/2017 and 02/20/2018 FINDINGS: CT HEAD FINDINGS Brain: Exam demonstrates age related atrophic change as the ventricles and cisterns are otherwise unchanged. Mild chronic ischemic microvascular disease. No mass, mass effect, shift of midline structures or acute hemorrhage. No evidence of acute infarction. Vascular: No hyperdense vessel or unexpected calcification. Skull: No evidence of fracture. Soft tissue swelling over the right frontal scalp. Skin staples over the right parietal scalp. Sinuses/Orbits: No acute finding. Other: None. CT CERVICAL SPINE FINDINGS Alignment: Within normal. Skull base and vertebrae: Moderate spondylosis throughout the cervical spine. Vertebral body heights are maintained. Atlantoaxial articulation is within normal. There is uncovertebral joint spurring and facet arthropathy. There is no acute fracture or subluxation. Mild bilateral neural foraminal narrowing at multiple levels due to adjacent bony spurring. Soft tissues and spinal canal: No prevertebral fluid or swelling. No visible canal hematoma. Disc levels: Disc space narrowing at the C3-4, C4-5, C5-6 and C6-7 levels. Upper chest: Negative. Other: None. IMPRESSION: No acute brain injury. No acute cervical spine injury. Focal soft tissue swelling over the right frontal scalp. Laceration right parietal scalp. Chronic ischemic microvascular disease and age  related atrophic change. Moderate spondylosis of the cervical spinal multilevel disc disease and multilevel neural foraminal narrowing. Electronically Signed   By: Elberta Fortis M.D.   On: 02/22/2018 20:56    Procedures Procedures (including critical care time)  Medications Ordered in ED Medications - No data to display   Initial Impression / Assessment and Plan / ED Course  I have reviewed the triage vital signs and the nursing notes.  Pertinent labs & imaging results that were available during my care of the patient were reviewed by me and considered in my medical decision making (see chart for details).     CT head and neck negative.  Patient with a fall with no severe injury to head neck but contusion to the occipital area.  She will follow-up as needed.  Final Clinical Impressions(s) / ED Diagnoses   Final diagnoses:  Fall, initial encounter    ED Discharge Orders    None       Bethann Berkshire, MD 02/22/18 2209

## 2018-02-22 NOTE — ED Triage Notes (Signed)
Toni ButtersGolden ticket and paper work

## 2018-02-22 NOTE — ED Notes (Signed)
[facility] Expected date:  Expected time:  Means of arrival:  Comments: 67 yr old female - fall from facility hematoma to head no blood thinners

## 2018-02-22 NOTE — Discharge Instructions (Addendum)
Follow up as needed

## 2018-02-22 NOTE — ED Triage Notes (Signed)
Pt at baseline, babbling,  per [facility],  Hematoma right forehead. No bleeding. Fall, no loc, occurring 7pm.  V/s on arrival, bp 106/69, hr 70, spo2 97 room air.

## 2018-02-26 DIAGNOSIS — R54 Age-related physical debility: Secondary | ICD-10-CM | POA: Diagnosis not present

## 2018-02-26 DIAGNOSIS — F0151 Vascular dementia with behavioral disturbance: Secondary | ICD-10-CM | POA: Diagnosis not present

## 2018-02-26 DIAGNOSIS — R296 Repeated falls: Secondary | ICD-10-CM | POA: Diagnosis not present

## 2018-02-26 DIAGNOSIS — M6281 Muscle weakness (generalized): Secondary | ICD-10-CM | POA: Diagnosis not present

## 2018-03-05 DIAGNOSIS — Z4802 Encounter for removal of sutures: Secondary | ICD-10-CM | POA: Diagnosis not present

## 2018-03-05 DIAGNOSIS — F0151 Vascular dementia with behavioral disturbance: Secondary | ICD-10-CM | POA: Diagnosis not present

## 2018-03-05 DIAGNOSIS — M6281 Muscle weakness (generalized): Secondary | ICD-10-CM | POA: Diagnosis not present

## 2018-03-05 DIAGNOSIS — R54 Age-related physical debility: Secondary | ICD-10-CM | POA: Diagnosis not present

## 2018-03-14 ENCOUNTER — Other Ambulatory Visit: Payer: Self-pay

## 2018-03-14 ENCOUNTER — Emergency Department (HOSPITAL_COMMUNITY)
Admission: EM | Admit: 2018-03-14 | Discharge: 2018-03-14 | Disposition: A | Discharge status: Home / Self Care | Payer: PPO | Attending: Emergency Medicine | Admitting: Emergency Medicine

## 2018-03-14 ENCOUNTER — Emergency Department (HOSPITAL_COMMUNITY): Payer: PPO

## 2018-03-14 ENCOUNTER — Encounter (HOSPITAL_COMMUNITY): Payer: Self-pay

## 2018-03-14 DIAGNOSIS — Y939 Activity, unspecified: Secondary | ICD-10-CM | POA: Insufficient documentation

## 2018-03-14 DIAGNOSIS — W1830XA Fall on same level, unspecified, initial encounter: Secondary | ICD-10-CM | POA: Insufficient documentation

## 2018-03-14 DIAGNOSIS — Z8673 Personal history of transient ischemic attack (TIA), and cerebral infarction without residual deficits: Secondary | ICD-10-CM | POA: Diagnosis not present

## 2018-03-14 DIAGNOSIS — Z85828 Personal history of other malignant neoplasm of skin: Secondary | ICD-10-CM | POA: Insufficient documentation

## 2018-03-14 DIAGNOSIS — Y999 Unspecified external cause status: Secondary | ICD-10-CM | POA: Diagnosis not present

## 2018-03-14 DIAGNOSIS — G300 Alzheimer's disease with early onset: Secondary | ICD-10-CM | POA: Diagnosis not present

## 2018-03-14 DIAGNOSIS — S098XXA Other specified injuries of head, initial encounter: Secondary | ICD-10-CM | POA: Diagnosis present

## 2018-03-14 DIAGNOSIS — S01111A Laceration without foreign body of right eyelid and periocular area, initial encounter: Secondary | ICD-10-CM | POA: Diagnosis not present

## 2018-03-14 DIAGNOSIS — Z79899 Other long term (current) drug therapy: Secondary | ICD-10-CM | POA: Insufficient documentation

## 2018-03-14 DIAGNOSIS — R4020 Unspecified coma: Secondary | ICD-10-CM | POA: Diagnosis not present

## 2018-03-14 DIAGNOSIS — F0281 Dementia in other diseases classified elsewhere with behavioral disturbance: Secondary | ICD-10-CM | POA: Insufficient documentation

## 2018-03-14 DIAGNOSIS — S199XXA Unspecified injury of neck, initial encounter: Secondary | ICD-10-CM | POA: Diagnosis not present

## 2018-03-14 DIAGNOSIS — R58 Hemorrhage, not elsewhere classified: Secondary | ICD-10-CM | POA: Diagnosis not present

## 2018-03-14 DIAGNOSIS — S0181XA Laceration without foreign body of other part of head, initial encounter: Secondary | ICD-10-CM

## 2018-03-14 DIAGNOSIS — S01112A Laceration without foreign body of left eyelid and periocular area, initial encounter: Secondary | ICD-10-CM | POA: Insufficient documentation

## 2018-03-14 DIAGNOSIS — W19XXXA Unspecified fall, initial encounter: Secondary | ICD-10-CM | POA: Diagnosis not present

## 2018-03-14 DIAGNOSIS — R410 Disorientation, unspecified: Secondary | ICD-10-CM | POA: Diagnosis not present

## 2018-03-14 DIAGNOSIS — Y92129 Unspecified place in nursing home as the place of occurrence of the external cause: Secondary | ICD-10-CM | POA: Insufficient documentation

## 2018-03-14 MED ORDER — BACITRACIN ZINC 500 UNIT/GM EX OINT
TOPICAL_OINTMENT | CUTANEOUS | Status: AC
  Filled 2018-03-14: qty 1.8

## 2018-03-14 MED ORDER — LIDOCAINE-EPINEPHRINE (PF) 2 %-1:200000 IJ SOLN
20.0000 mL | Freq: Once | INTRAMUSCULAR | Status: AC
  Administered 2018-03-14: 20 mL via INTRADERMAL
  Filled 2018-03-14: qty 20

## 2018-03-14 NOTE — ED Notes (Signed)
Bed: [facility] Expected date:  Expected time:  Means of arrival:  Comments: [age] F Fall and lac from SNF

## 2018-03-14 NOTE — ED Provider Notes (Signed)
[facility]-EMERGENCY DEPT Provider Note   CSN: [id number] Arrival date & time: 03/14/18  1608     History   Chief Complaint Chief Complaint  Patient presents with  . Fall    HPI Toni Sullivan is a 67 y.o. female who presents with an unwittnessed fall from a nursing home. She has advanced dementia and cannot contribute to her history. She has a laceration above the L eyebrow. When asked if she has head pain, facial pain, neck pain she says yes to all of these but also says yes when asked if she has pain over any part of her body. She is DNR. Tetanus was a month ago.  LEVEL 5 CAVEAT due to dementia  HPI  Past Medical History:  Diagnosis Date  . Dementia   . Headache(784.0)   . Memory loss   . Stroke [facility])     Patient Active Problem List   Diagnosis Date Noted  . UTI (urinary tract infection) 06/12/2017  . Delirium 06/12/2017  . Aggressive behavior   . Urinary tract infection without hematuria   . Acute metabolic encephalopathy 02/03/2017  . Acute lower UTI 02/03/2017  . 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 History of CVA (cerebrovascular accident) 07/03/2013    Past Surgical History:  Procedure Laterality Date  . MOUTH SURGERY    . SKIN CANCER EXCISION       OB History   None      Home Medications    Prior to Admission medications   Medication Sig Start Date End Date Taking? Authorizing Provider  acetaminophen (TYLENOL) 325 MG tablet Take 650 mg by mouth every 8 (eight) hours as needed (for pain level 1-8 or temperature greater than 101 degrees).    [provider]  acetaminophen (TYLENOL) 500 MG tablet Take 500 mg by mouth every 4 (four) hours as needed for fever or headache.    [provider]  alum & mag hydroxide-simeth (MINTOX) 200-200-20 MG/5ML suspension Take 30 mLs by mouth as needed for indigestion or heartburn. Do not exceed 4 doses in 24 hours     [provider]  Cranberry 450 MG TABS Take 450 mg by mouth daily.    [provider]  divalproex (DEPAKOTE SPRINKLE) 125 MG capsule Take 250 mg by mouth 3 (three) times daily. 06/18/17   [provider]  fluPHENAZine (PROLIXIN) 1 MG tablet Take 1 mg by mouth at bedtime. 08/01/17   [provider]  guaifenesin (ROBITUSSIN) 100 MG/5ML syrup Take 200 mg by mouth every 6 (six) hours as needed for cough.    [provider]  loperamide (IMODIUM) 2 MG capsule Take 2 mg by mouth as needed for diarrhea or loose stools. Do not exceed 8 doses in 24 hours    [provider]  LORazepam (ATIVAN) 0.5 MG tablet Take 0.5 mg by mouth every 6 (six) hours.  05/22/17   [provider]  magnesium hydroxide (MILK OF MAGNESIA) 400 MG/5ML suspension Take 30 mLs by mouth at bedtime as needed for mild constipation.    [provider]  Melatonin 3 MG TABS Take 3 mg by mouth at bedtime.    [provider]  mirtazapine (REMERON) 15 MG tablet Take 15 mg by mouth at bedtime.    [provider]  Neomycin-Bacitracin-Polymyxin (TRIPLE ANTIBIOTIC) [phone] OINT Apply 1 application topically as needed (minor skin tears or abrasions).    [provider]  Nutritional Supplements (  NUTRITIONAL DRINK PO) Take 236 mLs by mouth 3 (three) times daily. MIGHTY SHAKES     [provider]  PRESCRIPTION MEDICATION Apply 1 mL topically every 6 (six) hours as needed (severe agitation).    [provider]  risperiDONE (RISPERDAL) 0.5 MG tablet Take 0.5 mg by mouth every 6 (six) hours as needed (severe agitation).  12/01/17   [provider]  Rivastigmine (EXELON) 13.3 MG/24HR PT24 Apply 1 patch (13.3 mg total) topically daily. 09/01/16   Nilda Riggs, NP  sertraline (ZOLOFT) 25 MG tablet Take 75 mg by mouth daily. 07/24/17   [provider]    Family History History reviewed. No pertinent family  history.  Social History Social History   Tobacco Use  . Smoking status: Never Smoker  . Smokeless tobacco: Never Used  Substance Use Topics  . Alcohol use: Yes    Comment: 1 glass daily  . Drug use: No     Allergies   Penicillins   Review of Systems Review of Systems  Unable to perform ROS: Dementia     Physical Exam Updated Vital Signs BP 122/80 (BP Location: Right Arm)   Pulse 69   Temp 98.8 F (37.1 C)   Resp 18   SpO2 95%   Physical Exam  Constitutional: She appears well-developed and well-nourished. No distress.  Elderly female in NAD. In C-collar. Words are non-sensical  HENT:  Head: Normocephalic.  2cm laceration above the L eyebrow. Bleeding is controlled  Eyes: Pupils are equal, round, and reactive to light. Conjunctivae are normal. Right eye exhibits no discharge. Left eye exhibits no discharge. No scleral icterus.  Neck: Normal range of motion.  Cardiovascular: Normal rate and regular rhythm.  Pulmonary/Chest: Effort normal and breath sounds normal. No respiratory distress.  Abdominal: Soft. Bowel sounds are normal. She exhibits no distension. There is no tenderness.  Musculoskeletal:  No other signs of obvious injury. She moves her arms and legs spontaneously and they are non-tender to palpation  Neurological: She is alert. She is disoriented. GCS eye subscore is 4. GCS verbal subscore is 3. GCS motor subscore is 5.  Skin: Skin is warm and dry.  Psychiatric: She has a normal mood and affect. Her behavior is normal.  Nursing note and vitals reviewed.    ED Treatments / Results  Labs (all labs ordered are listed, but only abnormal results are displayed) Labs Reviewed - No data to display  EKG None  Radiology Ct Head Wo Contrast  Result Date: 03/14/2018 CLINICAL DATA:  Unwitnessed fall sustaining a laceration over the left eye with bleeding controlled. Dementia. EXAM: CT HEAD WITHOUT CONTRAST CT MAXILLOFACIAL WITHOUT CONTRAST CT CERVICAL  SPINE WITHOUT CONTRAST TECHNIQUE: Multidetector CT imaging of the head, cervical spine, and maxillofacial structures were performed using the standard protocol without intravenous contrast. Multiplanar CT image reconstructions of the cervical spine and maxillofacial structures were also generated. COMPARISON:  02/22/2018 CT head and cervical spine FINDINGS: CT HEAD FINDINGS Brain: Chronic stable superficial and central atrophy with mild to moderate degree of periventricular white matter hypodensity compatible with chronic small vessel ischemia. No acute intracranial hemorrhage, large vascular territory infarct, midline shift or edema. Midline fourth ventricle and basal cisterns. The brainstem and cerebellum are stable and within without acute appearing abnormality. No intra-axial mass nor extra-axial fluid collections. Vascular: No hyperdense vessel sign.  No unexpected calcifications. Skull: No acute skull fracture.  Mild forehead soft tissue swelling. Other: None CT MAXILLOFACIAL FINDINGS Osseous: No fracture or mandibular dislocation.  No destructive process. Orbits: Negative. No traumatic or inflammatory finding. Sinuses: Clear. Soft tissues: Soft tissue swelling with laceration involving the left supraorbital soft tissues, series image 11. CT CERVICAL SPINE FINDINGS Alignment: Slight reversal cervical lordosis likely on the basis of degenerative disc disease. Skull base and vertebrae: No acute fracture. Vertebral body heights are maintained. Atlantoaxial articulation is maintained. No primary bone lesion or focal pathologic process. Soft tissues and spinal canal: No prevertebral fluid or swelling. No visible canal hematoma. Disc levels: Moderate disc flattening C2-3 with moderate-to-marked disc flattening C3 through C7. Small posterior marginal osteophytes and uncovertebral joint osteoarthritis is noted of the cervical spine contributing to mild bilateral foraminal encroachment from C3 through C7. No jumped or  perched facets. Upper chest: Negative. Other: None IMPRESSION: CT head: Chronic stable atrophy with small vessel ischemic disease. No acute intracranial abnormality. CT maxillofacial: Left supraorbital soft tissue swelling with soft tissue laceration. No acute maxillofacial fracture. CT cervical spine: Cervical spondylosis with multilevel degenerative disc and facet arthropathy. No acute cervical spine fracture or static listhesis. Uncovertebral joint osteoarthritis with uncinate spurring is noted along the course of the cervical spine from C2 through C7. Electronically Signed   By: Tollie Eth M.D.   On: 03/14/2018 18:47   Ct Cervical Spine Wo Contrast  Result Date: 03/14/2018 CLINICAL DATA:  Unwitnessed fall sustaining a laceration over the left eye with bleeding controlled. Dementia. EXAM: CT HEAD WITHOUT CONTRAST CT MAXILLOFACIAL WITHOUT CONTRAST CT CERVICAL SPINE WITHOUT CONTRAST TECHNIQUE: Multidetector CT imaging of the head, cervical spine, and maxillofacial structures were performed using the standard protocol without intravenous contrast. Multiplanar CT image reconstructions of the cervical spine and maxillofacial structures were also generated. COMPARISON:  02/22/2018 CT head and cervical spine FINDINGS: CT HEAD FINDINGS Brain: Chronic stable superficial and central atrophy with mild to moderate degree of periventricular white matter hypodensity compatible with chronic small vessel ischemia. No acute intracranial hemorrhage, large vascular territory infarct, midline shift or edema. Midline fourth ventricle and basal cisterns. The brainstem and cerebellum are stable and within without acute appearing abnormality. No intra-axial mass nor extra-axial fluid collections. Vascular: No hyperdense vessel sign.  No unexpected calcifications. Skull: No acute skull fracture.  Mild forehead soft tissue swelling. Other: None CT MAXILLOFACIAL FINDINGS Osseous: No fracture or mandibular dislocation. No destructive  process. Orbits: Negative. No traumatic or inflammatory finding. Sinuses: Clear. Soft tissues: Soft tissue swelling with laceration involving the left supraorbital soft tissues, series image 11. CT CERVICAL SPINE FINDINGS Alignment: Slight reversal cervical lordosis likely on the basis of degenerative disc disease. Skull base and vertebrae: No acute fracture. Vertebral body heights are maintained. Atlantoaxial articulation is maintained. No primary bone lesion or focal pathologic process. Soft tissues and spinal canal: No prevertebral fluid or swelling. No visible canal hematoma. Disc levels: Moderate disc flattening C2-3 with moderate-to-marked disc flattening C3 through C7. Small posterior marginal osteophytes and uncovertebral joint osteoarthritis is noted of the cervical spine contributing to mild bilateral foraminal encroachment from C3 through C7. No jumped or perched facets. Upper chest: Negative. Other: None IMPRESSION: CT head: Chronic stable atrophy with small vessel ischemic disease. No acute intracranial abnormality. CT maxillofacial: Left supraorbital soft tissue swelling with soft tissue laceration. No acute maxillofacial fracture. CT cervical spine: Cervical spondylosis with multilevel degenerative disc and facet arthropathy. No acute cervical spine fracture or static listhesis. Uncovertebral joint osteoarthritis with uncinate spurring is noted along the course of the cervical spine from C2 through C7. Electronically Signed   By: Onalee Hua  Sterling BigKwon M.D.   On: 03/14/2018 18:47   Ct Maxillofacial Wo Contrast  Result Date: 03/14/2018 CLINICAL DATA:  Unwitnessed fall sustaining a laceration over the left eye with bleeding controlled. Dementia. EXAM: CT HEAD WITHOUT CONTRAST CT MAXILLOFACIAL WITHOUT CONTRAST CT CERVICAL SPINE WITHOUT CONTRAST TECHNIQUE: Multidetector CT imaging of the head, cervical spine, and maxillofacial structures were performed using the standard protocol without intravenous contrast.  Multiplanar CT image reconstructions of the cervical spine and maxillofacial structures were also generated. COMPARISON:  02/22/2018 CT head and cervical spine FINDINGS: CT HEAD FINDINGS Brain: Chronic stable superficial and central atrophy with mild to moderate degree of periventricular white matter hypodensity compatible with chronic small vessel ischemia. No acute intracranial hemorrhage, large vascular territory infarct, midline shift or edema. Midline fourth ventricle and basal cisterns. The brainstem and cerebellum are stable and within without acute appearing abnormality. No intra-axial mass nor extra-axial fluid collections. Vascular: No hyperdense vessel sign.  No unexpected calcifications. Skull: No acute skull fracture.  Mild forehead soft tissue swelling. Other: None CT MAXILLOFACIAL FINDINGS Osseous: No fracture or mandibular dislocation. No destructive process. Orbits: Negative. No traumatic or inflammatory finding. Sinuses: Clear. Soft tissues: Soft tissue swelling with laceration involving the left supraorbital soft tissues, series image 11. CT CERVICAL SPINE FINDINGS Alignment: Slight reversal cervical lordosis likely on the basis of degenerative disc disease. Skull base and vertebrae: No acute fracture. Vertebral body heights are maintained. Atlantoaxial articulation is maintained. No primary bone lesion or focal pathologic process. Soft tissues and spinal canal: No prevertebral fluid or swelling. No visible canal hematoma. Disc levels: Moderate disc flattening C2-3 with moderate-to-marked disc flattening C3 through C7. Small posterior marginal osteophytes and uncovertebral joint osteoarthritis is noted of the cervical spine contributing to mild bilateral foraminal encroachment from C3 through C7. No jumped or perched facets. Upper chest: Negative. Other: None IMPRESSION: CT head: Chronic stable atrophy with small vessel ischemic disease. No acute intracranial abnormality. CT maxillofacial: Left  supraorbital soft tissue swelling with soft tissue laceration. No acute maxillofacial fracture. CT cervical spine: Cervical spondylosis with multilevel degenerative disc and facet arthropathy. No acute cervical spine fracture or static listhesis. Uncovertebral joint osteoarthritis with uncinate spurring is noted along the course of the cervical spine from C2 through C7. Electronically Signed   By: Tollie Ethavid  Kwon M.D.   On: 03/14/2018 18:47    Procedures .Marland Kitchen.Laceration Repair Date/Time: 03/14/2018 7:43 PM Performed by: Bethel BornGekas, Keilon Ressel Marie, PA-C Authorized by: Bethel BornGekas, Nazariah Cadet Marie, PA-C   Consent:    Consent obtained:  Verbal   Consent given by:  Patient   Risks discussed:  Infection and pain   Alternatives discussed:  No treatment Anesthesia (see MAR for exact dosages):    Anesthesia method:  Local infiltration   Local anesthetic:  Lidocaine 2% WITH epi Laceration details:    Location:  Face   Face location:  L upper eyelid   Extent:  Partial thickness   Length (cm):  2   Depth (mm):  5 Repair type:    Repair type:  Simple Pre-procedure details:    Preparation:  Patient was prepped and draped in usual sterile fashion Exploration:    Wound exploration: wound explored through full range of motion and entire depth of wound probed and visualized   Treatment:    Area cleansed with:  Shur-Clens   Amount of cleaning:  Standard   Irrigation method:  Tap   Visualized foreign bodies/material removed: no   Skin repair:    Repair method:  Sutures  Suture size:  5-0   Wound skin closure material used: Vicryl rapide.   Number of sutures:  3 Approximation:    Approximation:  Close Post-procedure details:    Dressing:  Sterile dressing   Patient tolerance of procedure:  Tolerated well, no immediate complications   (including critical care time)    Medications Ordered in ED Medications  lidocaine-EPINEPHrine (XYLOCAINE W/EPI) 2 %-1:200000 (PF) injection 20 mL (has no administration in time  range)     Initial Impression / Assessment and Plan / ED Course  I have reviewed the triage vital signs and the nursing notes.  Pertinent labs & imaging results that were available during my care of the patient were reviewed by me and considered in my medical decision making (see chart for details).  23 elderly female with advanced dementia presents with unwitnessed fall and forehead laceration from nursing home. CT head, neck, face were obtained which showed nothing acute. Laceration was repaired and irrigated in the ED. Bottom of the wound visualized and bleeding controlled. 3 absorbable sutures placed. Wound was dressed. Husband is at bedside and wound care discussed. Tetanus is UTD. He would like to take her home rather than go by [facility].  Final Clinical Impressions(s) / ED Diagnoses   Final diagnoses:  Fall, initial encounter  Laceration of forehead, initial encounter    ED Discharge Orders    None       Bethel Born, PA-C 03/14/18 Julious Oka, MD 03/14/18 2216

## 2018-03-14 NOTE — Discharge Instructions (Signed)
Please keep wound covered for the next week The sutures are absorbable and will dissolve with time Return if worsening

## 2018-03-14 NOTE — ED Triage Notes (Signed)
[facility]itnessed [facility], after patient tripped over cords in her room. Patient sustained laceration above left eye- bleeding is controlled. Patient has history of dementia and is at her neuro baseline per report. Renette ButtersGolden ticket at bedside. [facility].

## 2018-03-19 DIAGNOSIS — M6281 Muscle weakness (generalized): Secondary | ICD-10-CM | POA: Diagnosis not present

## 2018-03-19 DIAGNOSIS — F0151 Vascular dementia with behavioral disturbance: Secondary | ICD-10-CM | POA: Diagnosis not present

## 2018-03-19 DIAGNOSIS — R54 Age-related physical debility: Secondary | ICD-10-CM | POA: Diagnosis not present

## 2018-03-19 DIAGNOSIS — R296 Repeated falls: Secondary | ICD-10-CM | POA: Diagnosis not present

## 2018-03-25 ENCOUNTER — Encounter (HOSPITAL_COMMUNITY): Payer: Self-pay

## 2018-03-25 ENCOUNTER — Emergency Department (HOSPITAL_COMMUNITY)
Admission: EM | Admit: 2018-03-25 | Discharge: 2018-03-26 | Disposition: A | Discharge status: Home / Self Care | Payer: PPO | Attending: Emergency Medicine | Admitting: Emergency Medicine

## 2018-03-25 DIAGNOSIS — W19XXXA Unspecified fall, initial encounter: Secondary | ICD-10-CM

## 2018-03-25 DIAGNOSIS — R609 Edema, unspecified: Secondary | ICD-10-CM | POA: Diagnosis not present

## 2018-03-25 DIAGNOSIS — Z8673 Personal history of transient ischemic attack (TIA), and cerebral infarction without residual deficits: Secondary | ICD-10-CM | POA: Insufficient documentation

## 2018-03-25 DIAGNOSIS — W06XXXA Fall from bed, initial encounter: Secondary | ICD-10-CM | POA: Diagnosis not present

## 2018-03-25 DIAGNOSIS — Y998 Other external cause status: Secondary | ICD-10-CM | POA: Insufficient documentation

## 2018-03-25 DIAGNOSIS — R000 Tachycardia, unspecified: Secondary | ICD-10-CM | POA: Diagnosis not present

## 2018-03-25 DIAGNOSIS — S0990XA Unspecified injury of head, initial encounter: Secondary | ICD-10-CM | POA: Diagnosis not present

## 2018-03-25 DIAGNOSIS — S199XXA Unspecified injury of neck, initial encounter: Secondary | ICD-10-CM | POA: Diagnosis not present

## 2018-03-25 DIAGNOSIS — Z85828 Personal history of other malignant neoplasm of skin: Secondary | ICD-10-CM | POA: Diagnosis not present

## 2018-03-25 DIAGNOSIS — S0083XA Contusion of other part of head, initial encounter: Secondary | ICD-10-CM | POA: Diagnosis not present

## 2018-03-25 DIAGNOSIS — Y9389 Activity, other specified: Secondary | ICD-10-CM | POA: Diagnosis not present

## 2018-03-25 DIAGNOSIS — Z79899 Other long term (current) drug therapy: Secondary | ICD-10-CM | POA: Insufficient documentation

## 2018-03-25 DIAGNOSIS — R400 Somnolence: Secondary | ICD-10-CM | POA: Diagnosis not present

## 2018-03-25 DIAGNOSIS — F0390 Unspecified dementia without behavioral disturbance: Secondary | ICD-10-CM | POA: Diagnosis not present

## 2018-03-25 DIAGNOSIS — Y92122 Bedroom in nursing home as the place of occurrence of the external cause: Secondary | ICD-10-CM | POA: Insufficient documentation

## 2018-03-25 DIAGNOSIS — Y92129 Unspecified place in nursing home as the place of occurrence of the external cause: Secondary | ICD-10-CM

## 2018-03-25 NOTE — ED Notes (Signed)
[facility] Expected date:  Expected time:  Means of arrival:  Comments: EMS 67 yo female from SNF-unwitnessed fall 132/86 HR 73

## 2018-03-25 NOTE — ED Triage Notes (Signed)
[facility] Unwitnessed fall from bed-EMS found patient between bed and the night stand on her right side. Patient has dementia-hx CVA-previous fall on the 17th, patient has laceration above her eye. Patient has DNR. Patient has multiple ecchymosis on face from falls-in various stages of healing.

## 2018-03-26 ENCOUNTER — Emergency Department (HOSPITAL_COMMUNITY): Payer: PPO

## 2018-03-26 DIAGNOSIS — F0151 Vascular dementia with behavioral disturbance: Secondary | ICD-10-CM | POA: Diagnosis not present

## 2018-03-26 DIAGNOSIS — M2550 Pain in unspecified joint: Secondary | ICD-10-CM | POA: Diagnosis not present

## 2018-03-26 DIAGNOSIS — Z7401 Bed confinement status: Secondary | ICD-10-CM | POA: Diagnosis not present

## 2018-03-26 DIAGNOSIS — W19XXXA Unspecified fall, initial encounter: Secondary | ICD-10-CM | POA: Diagnosis not present

## 2018-03-26 DIAGNOSIS — R296 Repeated falls: Secondary | ICD-10-CM | POA: Diagnosis not present

## 2018-03-26 DIAGNOSIS — R54 Age-related physical debility: Secondary | ICD-10-CM | POA: Diagnosis not present

## 2018-03-26 DIAGNOSIS — M6281 Muscle weakness (generalized): Secondary | ICD-10-CM | POA: Diagnosis not present

## 2018-03-26 DIAGNOSIS — R531 Weakness: Secondary | ICD-10-CM | POA: Diagnosis not present

## 2018-03-26 DIAGNOSIS — S0990XA Unspecified injury of head, initial encounter: Secondary | ICD-10-CM | POA: Diagnosis not present

## 2018-03-26 DIAGNOSIS — S199XXA Unspecified injury of neck, initial encounter: Secondary | ICD-10-CM | POA: Diagnosis not present

## 2018-03-26 DIAGNOSIS — I959 Hypotension, unspecified: Secondary | ICD-10-CM | POA: Diagnosis not present

## 2018-03-26 NOTE — ED Notes (Signed)
[facility]

## 2018-03-26 NOTE — ED Notes (Signed)
[facility]

## 2018-03-26 NOTE — ED Provider Notes (Signed)
[facility]-EMERGENCY DEPT Provider Note: Lowella Dell, MD, FACEP  CSN: [id number] MRN: [id number] ARRIVAL: 03/25/18 at 2340 ROOM: [facility]   CHIEF COMPLAINT  Fall  Level 5 caveat: Dementia HISTORY OF PRESENT ILLNESS  03/26/18 12:06 AM Toni Sullivan is a 67 y.o. female with a history of dementia.  She was found on the floor of her living facility just prior to arrival after an apparent unwitnessed fall from her bed.  She has a hematoma of the left forehead and ecchymosis around the left eye but these are reportedly due to older injuries.   Past Medical History:  Diagnosis Date  . Dementia   . Headache(784.0)   . Memory loss   . Stroke [facility])     Past Surgical History:  Procedure Laterality Date  . MOUTH SURGERY    . SKIN CANCER EXCISION      No family history on file.  Social History   Tobacco Use  . Smoking status: Never Smoker  . Smokeless tobacco: Never Used  Substance Use Topics  . Alcohol use: Yes    Comment: 1 glass daily  . Drug use: No    Prior to Admission medications   Medication Sig Start Date End Date Taking? Authorizing Provider  acetaminophen (TYLENOL) 325 MG tablet Take 650 mg by mouth every 8 (eight) hours as needed (for pain level 1-8 or temperature greater than 101 degrees).    [provider]  acetaminophen (TYLENOL) 500 MG tablet Take 500 mg by mouth every 4 (four) hours as needed for fever or headache.    [provider]  alum & mag hydroxide-simeth (MINTOX) 200-200-20 MG/5ML suspension Take 30 mLs by mouth as needed for indigestion or heartburn. Do not exceed 4 doses in 24 hours    [provider]  Cranberry 450 MG TABS Take 450 mg by mouth daily.    [provider]  divalproex (DEPAKOTE SPRINKLE) 125 MG capsule Take 250 mg by mouth 3 (three) times daily. 06/18/17   [provider]  fluPHENAZine (PROLIXIN) 1 MG tablet Take 1 mg by mouth at bedtime. 08/01/17   [provider]  guaifenesin  (ROBITUSSIN) 100 MG/5ML syrup Take 200 mg by mouth every 6 (six) hours as needed for cough.    [provider]  loperamide (IMODIUM) 2 MG capsule Take 2 mg by mouth as needed for diarrhea or loose stools. Do not exceed 8 doses in 24 hours    [provider]  LORazepam (ATIVAN) 0.5 MG tablet Take 0.5 mg by mouth every 6 (six) hours.  05/22/17   [provider]  LORAZEPAM IJ Apply 2 mg topically every 6 (six) hours as needed (severe agitation).    [provider]  magnesium hydroxide (MILK OF MAGNESIA) 400 MG/5ML suspension Take 30 mLs by mouth at bedtime as needed for mild constipation.    [provider]  Melatonin 3 MG TABS Take 3 mg by mouth at bedtime.    [provider]  mirtazapine (REMERON) 15 MG tablet Take 15 mg by mouth at bedtime.    [provider]  Neomycin-Bacitracin-Polymyxin (TRIPLE ANTIBIOTIC) [phone] OINT Apply 1 application topically as needed (minor skin tears or abrasions).    [provider]  Nutritional Supplements (NUTRITIONAL DRINK PO) Take 236 mLs by mouth 3 (three) times daily. MIGHTY SHAKES     [provider]  PRESCRIPTION MEDICATION Apply 2 mg topically every 6 (six) hours as needed (severe agitation). Lorazepam 2 mg Gel  [provider]  risperiDONE (RISPERDAL) 0.5 MG tablet Take 0.5 mg by mouth every 6 (six) hours as needed (severe agitation).  12/01/17   [provider]  Rivastigmine (EXELON) 13.3 MG/24HR PT24 Apply 1 patch (13.3 mg total) topically daily. 09/01/16   Nilda RiggsMartin, Nancy Carolyn, NP  sertraline (ZOLOFT) 25 MG tablet Take 75 mg by mouth daily. 07/24/17   [provider]    Allergies Penicillins   REVIEW OF SYSTEMS     PHYSICAL EXAMINATION  Initial Vital Signs Blood pressure 112/63, pulse 62, temperature 98.2 F (36.8 C), temperature source Oral, resp. rate 16, SpO2 100 %.  Examination General: Well-developed, well-nourished female in no  acute distress; appears older than age of record HENT: normocephalic; left forehead hematoma and left periorbital ecchymosis which appears subacute Eyes: pupils equal, round and reactive to light Neck: Immobilized in rolled towel Heart: regular rate and rhythm Lungs: clear to auscultation bilaterally Abdomen: soft; nondistended; bowel sounds present Extremities: No deformity; contractures; no pain on passive movement Neurologic: Somnolent but will arouse to stimuli; speech incomprehensible; noted to move all extremities but limited exam due to dementia Skin: Warm and dry   RESULTS  Summary of this visit's results, reviewed by myself:   EKG Interpretation  Date/Time:    Ventricular Rate:    PR Interval:    QRS Duration:   QT Interval:    QTC Calculation:   R Axis:     Text Interpretation:        Laboratory Studies: No results found for this or any previous visit (from the past 24 hour(s)). Imaging Studies: Ct Head Wo Contrast  Result Date: 03/26/2018 CLINICAL DATA:  Dementia patient post unwitnessed fall from bed. EXAM: CT HEAD WITHOUT CONTRAST CT CERVICAL SPINE WITHOUT CONTRAST TECHNIQUE: Multidetector CT imaging of the head and cervical spine was performed following the standard protocol without intravenous contrast. Multiplanar CT image reconstructions of the cervical spine were also generated. COMPARISON:  Multiple prior exams most recently 12 days ago. FINDINGS: CT HEAD FINDINGS Brain: No acute intracranial hemorrhage. Stable degree of severe atrophy with moderate chronic small vessel ischemia. No evidence of acute infarct. No subdural or extra-axial collection. Vascular: No hyperdense vessel or unexpected calcification. Skull: No skull fracture. Unchanged small exostosis or hyperostosis from the inner table of the medial temporal bone Sinuses/Orbits: No acute findings. Chronic frothy material in left sphenoid sinus. Other: None. CT CERVICAL SPINE FINDINGS Alignment: No traumatic  subluxation. Right head tilt which is likely positioning. Skull base and vertebrae: No acute fracture. Vertebral body heights are maintained. The dens and skull base are intact. Soft tissues and spinal canal: No prevertebral fluid or swelling. No visible canal hematoma. Disc levels: Disc space narrowing and endplate spurring throughout, most prominent at C5-C6. Scattered mild facet arthropathy. Upper chest: No acute finding. Other: None. IMPRESSION: 1. No acute intracranial abnormality. No skull fracture. Stable degree of atrophy and chronic small vessel ischemia. 2. Degenerative change in the cervical spine without acute fracture or traumatic subluxation. Electronically Signed   By: Rubye OaksMelanie  Ehinger M.D.   On: 03/26/2018 01:28   Ct Cervical Spine Wo Contrast  Result Date: 03/26/2018 CLINICAL DATA:  Dementia patient post unwitnessed fall from bed. EXAM: CT HEAD WITHOUT CONTRAST CT CERVICAL SPINE WITHOUT CONTRAST TECHNIQUE: Multidetector CT imaging of the head and cervical spine was performed following the standard protocol without intravenous contrast. Multiplanar CT image reconstructions of the cervical spine were also generated. COMPARISON:  Multiple prior exams most recently 12 days ago.  FINDINGS: CT HEAD FINDINGS Brain: No acute intracranial hemorrhage. Stable degree of severe atrophy with moderate chronic small vessel ischemia. No evidence of acute infarct. No subdural or extra-axial collection. Vascular: No hyperdense vessel or unexpected calcification. Skull: No skull fracture. Unchanged small exostosis or hyperostosis from the inner table of the medial temporal bone Sinuses/Orbits: No acute findings. Chronic frothy material in left sphenoid sinus. Other: None. CT CERVICAL SPINE FINDINGS Alignment: No traumatic subluxation. Right head tilt which is likely positioning. Skull base and vertebrae: No acute fracture. Vertebral body heights are maintained. The dens and skull base are intact. Soft tissues and  spinal canal: No prevertebral fluid or swelling. No visible canal hematoma. Disc levels: Disc space narrowing and endplate spurring throughout, most prominent at C5-C6. Scattered mild facet arthropathy. Upper chest: No acute finding. Other: None. IMPRESSION: 1. No acute intracranial abnormality. No skull fracture. Stable degree of atrophy and chronic small vessel ischemia. 2. Degenerative change in the cervical spine without acute fracture or traumatic subluxation. Electronically Signed   By: Rubye Oaks M.D.   On: 03/26/2018 01:28    ED COURSE and MDM  Nursing notes and initial vitals signs, including pulse oximetry, reviewed.  Vitals:   03/26/18 0003 03/26/18 0203  BP: 112/63 113/67  Pulse: 62 69  Resp: 16 16  Temp: 98.2 F (36.8 C)   TempSrc: Oral   SpO2: 100% 100%    PROCEDURES    ED DIAGNOSES     ICD-10-CM   1. Fall from bed, initial encounter W06.XXXA   2. Fall at nursing home, initial encounter W19.Lorne Skeens    Z61.096        Paula Libra, MD 03/26/18 [phone]

## 2018-03-27 ENCOUNTER — Other Ambulatory Visit: Payer: Self-pay

## 2018-03-27 NOTE — Patient Outreach (Signed)
[facility]) Care Management  03/27/2018  Toni Sullivan 12/01/1950 [id number]    Telephone Screen Referral Date : 03/26/2018 Referral Source:[facility] ED Census Referral Reason:ED Utilization  Insurance: HTA   Outreach attempt # 1 To patient. HIPAA verified by [facility].  [place].  I spoke with the care manager and she states that [facility]  She has access to social work and pharmacy.  Plan: RN Health Coach will close the case at this time due to no further interventions are needed at this time   Juanell Fairlyraci Arria Naim RN, BSN, [facility] RN Health Coach Disease Management [facility] Direct Dial:  [phone] Fax: [phone]

## 2018-04-04 ENCOUNTER — Ambulatory Visit: Payer: PPO

## 2018-04-24 DIAGNOSIS — R296 Repeated falls: Secondary | ICD-10-CM | POA: Diagnosis not present

## 2018-04-24 DIAGNOSIS — F0151 Vascular dementia with behavioral disturbance: Secondary | ICD-10-CM | POA: Diagnosis not present

## 2018-04-24 DIAGNOSIS — R54 Age-related physical debility: Secondary | ICD-10-CM | POA: Diagnosis not present

## 2018-07-04 ENCOUNTER — Other Ambulatory Visit: Payer: Self-pay

## 2018-07-22 ENCOUNTER — Other Ambulatory Visit (HOSPITAL_COMMUNITY): Payer: PPO

## 2018-07-22 ENCOUNTER — Emergency Department (HOSPITAL_COMMUNITY): Payer: PPO

## 2018-07-22 ENCOUNTER — Other Ambulatory Visit: Payer: Self-pay

## 2018-07-22 ENCOUNTER — Inpatient Hospital Stay (HOSPITAL_COMMUNITY)
Admission: EM | Admit: 2018-07-22 | Discharge: 2018-07-31 | DRG: 871 | Disposition: A | Discharge status: Skilled Nursing Facility | Payer: PPO | Source: Skilled Nursing Facility | Attending: Internal Medicine | Admitting: Internal Medicine

## 2018-07-22 ENCOUNTER — Encounter (HOSPITAL_COMMUNITY): Payer: Self-pay

## 2018-07-22 ENCOUNTER — Inpatient Hospital Stay (HOSPITAL_COMMUNITY): Payer: PPO

## 2018-07-22 DIAGNOSIS — D6959 Other secondary thrombocytopenia: Secondary | ICD-10-CM | POA: Diagnosis not present

## 2018-07-22 DIAGNOSIS — I959 Hypotension, unspecified: Secondary | ICD-10-CM | POA: Diagnosis not present

## 2018-07-22 DIAGNOSIS — F0281 Dementia in other diseases classified elsewhere with behavioral disturbance: Secondary | ICD-10-CM | POA: Diagnosis present

## 2018-07-22 DIAGNOSIS — Z8673 Personal history of transient ischemic attack (TIA), and cerebral infarction without residual deficits: Secondary | ICD-10-CM | POA: Diagnosis not present

## 2018-07-22 DIAGNOSIS — G92 Toxic encephalopathy: Secondary | ICD-10-CM | POA: Diagnosis not present

## 2018-07-22 DIAGNOSIS — R0600 Dyspnea, unspecified: Secondary | ICD-10-CM

## 2018-07-22 DIAGNOSIS — Z66 Do not resuscitate: Secondary | ICD-10-CM | POA: Diagnosis not present

## 2018-07-22 DIAGNOSIS — N179 Acute kidney failure, unspecified: Secondary | ICD-10-CM | POA: Diagnosis present

## 2018-07-22 DIAGNOSIS — N390 Urinary tract infection, site not specified: Secondary | ICD-10-CM

## 2018-07-22 DIAGNOSIS — Z7401 Bed confinement status: Secondary | ICD-10-CM | POA: Diagnosis not present

## 2018-07-22 DIAGNOSIS — R4182 Altered mental status, unspecified: Secondary | ICD-10-CM | POA: Diagnosis not present

## 2018-07-22 DIAGNOSIS — E872 Acidosis: Secondary | ICD-10-CM | POA: Diagnosis present

## 2018-07-22 DIAGNOSIS — N3001 Acute cystitis with hematuria: Secondary | ICD-10-CM | POA: Diagnosis not present

## 2018-07-22 DIAGNOSIS — R0902 Hypoxemia: Secondary | ICD-10-CM | POA: Diagnosis not present

## 2018-07-22 DIAGNOSIS — D649 Anemia, unspecified: Secondary | ICD-10-CM | POA: Diagnosis not present

## 2018-07-22 DIAGNOSIS — Z880 Allergy status to penicillin: Secondary | ICD-10-CM

## 2018-07-22 DIAGNOSIS — R410 Disorientation, unspecified: Secondary | ICD-10-CM | POA: Diagnosis not present

## 2018-07-22 DIAGNOSIS — Z79899 Other long term (current) drug therapy: Secondary | ICD-10-CM

## 2018-07-22 DIAGNOSIS — Z85828 Personal history of other malignant neoplasm of skin: Secondary | ICD-10-CM | POA: Diagnosis not present

## 2018-07-22 DIAGNOSIS — A419 Sepsis, unspecified organism: Principal | ICD-10-CM | POA: Diagnosis present

## 2018-07-22 DIAGNOSIS — Z79890 Hormone replacement therapy (postmenopausal): Secondary | ICD-10-CM

## 2018-07-22 DIAGNOSIS — R6520 Severe sepsis without septic shock: Secondary | ICD-10-CM

## 2018-07-22 DIAGNOSIS — J810 Acute pulmonary edema: Secondary | ICD-10-CM | POA: Diagnosis not present

## 2018-07-22 DIAGNOSIS — I4891 Unspecified atrial fibrillation: Secondary | ICD-10-CM

## 2018-07-22 DIAGNOSIS — R1110 Vomiting, unspecified: Secondary | ICD-10-CM

## 2018-07-22 DIAGNOSIS — R001 Bradycardia, unspecified: Secondary | ICD-10-CM | POA: Diagnosis not present

## 2018-07-22 DIAGNOSIS — G300 Alzheimer's disease with early onset: Secondary | ICD-10-CM | POA: Diagnosis present

## 2018-07-22 DIAGNOSIS — R6521 Severe sepsis with septic shock: Secondary | ICD-10-CM

## 2018-07-22 DIAGNOSIS — I5031 Acute diastolic (congestive) heart failure: Secondary | ICD-10-CM | POA: Diagnosis not present

## 2018-07-22 DIAGNOSIS — N3000 Acute cystitis without hematuria: Secondary | ICD-10-CM

## 2018-07-22 DIAGNOSIS — F0391 Unspecified dementia with behavioral disturbance: Secondary | ICD-10-CM | POA: Diagnosis not present

## 2018-07-22 DIAGNOSIS — Z87440 Personal history of urinary (tract) infections: Secondary | ICD-10-CM

## 2018-07-22 DIAGNOSIS — R509 Fever, unspecified: Secondary | ICD-10-CM | POA: Diagnosis not present

## 2018-07-22 DIAGNOSIS — F418 Other specified anxiety disorders: Secondary | ICD-10-CM | POA: Diagnosis not present

## 2018-07-22 DIAGNOSIS — M2550 Pain in unspecified joint: Secondary | ICD-10-CM | POA: Diagnosis not present

## 2018-07-22 DIAGNOSIS — E876 Hypokalemia: Secondary | ICD-10-CM | POA: Diagnosis not present

## 2018-07-22 DIAGNOSIS — R5381 Other malaise: Secondary | ICD-10-CM | POA: Diagnosis not present

## 2018-07-22 DIAGNOSIS — R1111 Vomiting without nausea: Secondary | ICD-10-CM | POA: Diagnosis not present

## 2018-07-22 DIAGNOSIS — R404 Transient alteration of awareness: Secondary | ICD-10-CM | POA: Diagnosis not present

## 2018-07-22 DIAGNOSIS — G9341 Metabolic encephalopathy: Secondary | ICD-10-CM | POA: Diagnosis not present

## 2018-07-22 LAB — MRSA PCR SCREENING: MRSA by PCR: NEGATIVE

## 2018-07-22 LAB — INFLUENZA PANEL BY PCR (TYPE A & B)
INFLAPCR: NEGATIVE
INFLBPCR: NEGATIVE

## 2018-07-22 LAB — URINALYSIS, ROUTINE W REFLEX MICROSCOPIC
BILIRUBIN URINE: NEGATIVE
GLUCOSE, UA: NEGATIVE mg/dL
KETONES UR: 5 mg/dL — AB
Nitrite: NEGATIVE
PH: 7 (ref 5.0–8.0)
PROTEIN: NEGATIVE mg/dL
Specific Gravity, Urine: 1.015 (ref 1.005–1.030)

## 2018-07-22 LAB — GLUCOSE, CAPILLARY: Glucose-Capillary: 168 mg/dL — ABNORMAL HIGH (ref 70–99)

## 2018-07-22 LAB — COMPREHENSIVE METABOLIC PANEL
ALBUMIN: 3.6 g/dL (ref 3.5–5.0)
ALT: 17 U/L (ref 0–44)
AST: 36 U/L (ref 15–41)
Alkaline Phosphatase: 78 U/L (ref 38–126)
Anion gap: 11 (ref 5–15)
BILIRUBIN TOTAL: 0.5 mg/dL (ref 0.3–1.2)
BUN: 19 mg/dL (ref 8–23)
CO2: 24 mmol/L (ref 22–32)
CREATININE: 0.85 mg/dL (ref 0.44–1.00)
Calcium: 8.9 mg/dL (ref 8.9–10.3)
Chloride: 106 mmol/L (ref 98–111)
GFR calc Af Amer: >60 mL/min (ref >60)
GLUCOSE: 141 mg/dL — AB (ref 70–99)
POTASSIUM: 4.5 mmol/L (ref 3.5–5.1)
Sodium: 141 mmol/L (ref 135–145)
TOTAL PROTEIN: 6.8 g/dL (ref 6.5–8.1)

## 2018-07-22 LAB — CBC WITH DIFFERENTIAL/PLATELET
ABS IMMATURE GRANULOCYTES: 0.03 10*3/uL (ref 0.00–0.07)
BASOS PCT: 0 %
Basophils Absolute: 0 10*3/uL (ref 0.0–0.1)
EOS PCT: 0 %
Eosinophils Absolute: 0 10*3/uL (ref 0.0–0.5)
HCT: 40.9 % (ref 36.0–46.0)
HEMOGLOBIN: 13.1 g/dL (ref 12.0–15.0)
Immature Granulocytes: 0 %
Lymphocytes Relative: 13 %
Lymphs Abs: 1.1 10*3/uL (ref 0.7–4.0)
MCH: 27.2 pg (ref 26.0–34.0)
MCHC: 32 g/dL (ref 30.0–36.0)
MCV: 84.9 fL (ref 80.0–100.0)
MONO ABS: 0.6 10*3/uL (ref 0.1–1.0)
MONOS PCT: 7 %
NEUTROS ABS: 6.9 10*3/uL (ref 1.7–7.7)
Neutrophils Relative %: 80 %
PLATELETS: 154 10*3/uL (ref 150–400)
RBC: 4.82 MIL/uL (ref 3.87–5.11)
RDW: 13.2 % (ref 11.5–15.5)
WBC: 8.6 10*3/uL (ref 4.0–10.5)
nRBC: 0 % (ref 0.0–0.2)

## 2018-07-22 LAB — ECHOCARDIOGRAM LIMITED
Height: 67 in
Weight: 2320 oz

## 2018-07-22 LAB — LIPASE, BLOOD: Lipase: 30 U/L (ref 11–51)

## 2018-07-22 LAB — LACTIC ACID, PLASMA
LACTIC ACID, VENOUS: 1.9 mmol/L (ref 0.5–1.9)
Lactic Acid, Venous: 1.7 mmol/L (ref 0.5–1.9)

## 2018-07-22 LAB — TSH: TSH: 0.753 u[IU]/mL (ref 0.350–4.500)

## 2018-07-22 LAB — I-STAT CG4 LACTIC ACID, ED
LACTIC ACID, VENOUS: 3.97 mmol/L — AB (ref 0.5–1.9)
Lactic Acid, Venous: 3.84 mmol/L (ref 0.5–1.9)

## 2018-07-22 LAB — PROCALCITONIN: Procalcitonin: 5.91 ng/mL

## 2018-07-22 LAB — BRAIN NATRIURETIC PEPTIDE: B NATRIURETIC PEPTIDE 5: 99.8 pg/mL (ref 0.0–100.0)

## 2018-07-22 MED ORDER — ENOXAPARIN SODIUM 40 MG/0.4ML ~~LOC~~ SOLN
40.0000 mg | SUBCUTANEOUS | Status: DC
  Administered 2018-07-22 – 2018-07-31 (×10): 40 mg via SUBCUTANEOUS
  Filled 2018-07-22 (×10): qty 0.4

## 2018-07-22 MED ORDER — PHENTOLAMINE MESYLATE 5 MG IJ SOLR
5.0000 mg | Freq: Once | INTRAMUSCULAR | Status: AC
  Administered 2018-07-22: 5 mg via SUBCUTANEOUS
  Filled 2018-07-22: qty 5

## 2018-07-22 MED ORDER — VASOPRESSIN 20 UNIT/ML IV SOLN
0.0300 [IU]/min | INTRAVENOUS | Status: DC
  Filled 2018-07-22: qty 2

## 2018-07-22 MED ORDER — SODIUM CHLORIDE 0.9 % IV SOLN
1.0000 g | INTRAVENOUS | Status: DC
  Administered 2018-07-22 – 2018-07-23 (×2): 1 g via INTRAVENOUS
  Filled 2018-07-22 (×2): qty 10
  Filled 2018-07-22: qty 1

## 2018-07-22 MED ORDER — HYDROCORTISONE NA SUCCINATE PF 100 MG IJ SOLR
100.0000 mg | Freq: Once | INTRAMUSCULAR | Status: AC
  Administered 2018-07-22: 100 mg via INTRAVENOUS
  Filled 2018-07-22: qty 2

## 2018-07-22 MED ORDER — ALBUMIN HUMAN 25 % IV SOLN
50.0000 g | Freq: Once | INTRAVENOUS | Status: DC
  Filled 2018-07-22: qty 200

## 2018-07-22 MED ORDER — LACTATED RINGERS IV BOLUS
1000.0000 mL | Freq: Once | INTRAVENOUS | Status: AC
  Administered 2018-07-22: 1000 mL via INTRAVENOUS

## 2018-07-22 MED ORDER — SODIUM CHLORIDE 0.9 % IV SOLN: 2.0000 g | Freq: Once | INTRAVENOUS | Status: DC

## 2018-07-22 MED ORDER — ALBUMIN HUMAN 25 % IV SOLN
50.0000 g | Freq: Four times a day (QID) | INTRAVENOUS | Status: AC
  Administered 2018-07-22 – 2018-07-23 (×3): 50 g via INTRAVENOUS
  Filled 2018-07-22: qty 200

## 2018-07-22 MED ORDER — NITROGLYCERIN 2 % TD OINT
1.0000 [in_us] | TOPICAL_OINTMENT | Freq: Three times a day (TID) | TRANSDERMAL | Status: AC
  Administered 2018-07-22 – 2018-07-23 (×2): 1 [in_us] via TOPICAL
  Filled 2018-07-22: qty 30

## 2018-07-22 MED ORDER — SODIUM CHLORIDE 0.9 % IV SOLN
INTRAVENOUS | Status: DC
  Administered 2018-07-22 (×2): via INTRAVENOUS

## 2018-07-22 MED ORDER — SODIUM CHLORIDE 0.9 % IV SOLN
Freq: Once | INTRAVENOUS | Status: AC
  Administered 2018-07-22: 06:00:00 via INTRAVENOUS

## 2018-07-22 MED ORDER — SERTRALINE HCL 50 MG PO TABS
75.0000 mg | ORAL_TABLET | Freq: Every day | ORAL | Status: DC
  Administered 2018-07-23 – 2018-07-31 (×9): 75 mg via ORAL
  Filled 2018-07-22 (×5): qty 1
  Filled 2018-07-22 (×4): qty 2

## 2018-07-22 MED ORDER — METRONIDAZOLE IN NACL 5-0.79 MG/ML-% IV SOLN
500.0000 mg | Freq: Three times a day (TID) | INTRAVENOUS | Status: DC
  Administered 2018-07-22 – 2018-07-23 (×5): 500 mg via INTRAVENOUS
  Filled 2018-07-22 (×5): qty 100

## 2018-07-22 MED ORDER — ACETAMINOPHEN 650 MG RE SUPP
650.0000 mg | Freq: Once | RECTAL | Status: AC
  Administered 2018-07-22: 650 mg via RECTAL
  Filled 2018-07-22: qty 1

## 2018-07-22 MED ORDER — SODIUM CHLORIDE 0.9 % IV BOLUS
1000.0000 mL | Freq: Once | INTRAVENOUS | Status: AC
  Administered 2018-07-22: 1000 mL via INTRAVENOUS

## 2018-07-22 MED ORDER — SODIUM CHLORIDE 0.9 % IV BOLUS
1000.0000 mL | Freq: Once | INTRAVENOUS | Status: AC
  Administered 2018-07-22 (×2): 1000 mL via INTRAVENOUS

## 2018-07-22 MED ORDER — ALBUMIN HUMAN 25 % IV SOLN
50.0000 g | Freq: Once | INTRAVENOUS | Status: AC
  Administered 2018-07-22: 50 g via INTRAVENOUS
  Filled 2018-07-22: qty 200

## 2018-07-22 MED ORDER — DIVALPROEX SODIUM 125 MG PO CSDR
250.0000 mg | DELAYED_RELEASE_CAPSULE | Freq: Three times a day (TID) | ORAL | Status: DC
  Administered 2018-07-22 – 2018-07-31 (×27): 250 mg via ORAL
  Filled 2018-07-22 (×28): qty 2

## 2018-07-22 MED ORDER — NUTRITIONAL DRINK PO LIQD: Freq: Three times a day (TID) | ORAL | Status: DC

## 2018-07-22 MED ORDER — VANCOMYCIN HCL IN DEXTROSE 1-5 GM/200ML-% IV SOLN
1000.0000 mg | Freq: Once | INTRAVENOUS | Status: AC
  Administered 2018-07-22: 1000 mg via INTRAVENOUS
  Filled 2018-07-22: qty 200

## 2018-07-22 MED ORDER — SODIUM CHLORIDE 0.9 % IV BOLUS: 1000.0000 mL | Freq: Once | INTRAVENOUS | Status: DC

## 2018-07-22 MED ORDER — VASOPRESSIN 20 UNIT/ML IV SOLN: 0.0300 [IU]/min | INTRAVENOUS | Status: DC

## 2018-07-22 MED ORDER — NOREPINEPHRINE 4 MG/250ML-% IV SOLN
0.0000 ug/min | INTRAVENOUS | Status: DC
  Administered 2018-07-22: 2 ug/min via INTRAVENOUS
  Filled 2018-07-22 (×2): qty 250

## 2018-07-22 MED ORDER — SODIUM CHLORIDE 0.9 % IV SOLN
Freq: Once | INTRAVENOUS | Status: AC
  Administered 2018-07-22: 08:00:00 via INTRAVENOUS

## 2018-07-22 MED ORDER — ACETAMINOPHEN 650 MG RE SUPP: 650.0000 mg | Freq: Three times a day (TID) | RECTAL | Status: DC | PRN

## 2018-07-22 MED ORDER — ASPIRIN 300 MG RE SUPP
300.0000 mg | Freq: Every day | RECTAL | Status: DC
  Filled 2018-07-22: qty 1

## 2018-07-22 MED ORDER — RISPERIDONE 0.25 MG PO TABS
0.5000 mg | ORAL_TABLET | Freq: Four times a day (QID) | ORAL | Status: DC | PRN
  Administered 2018-07-25 – 2018-07-31 (×9): 0.5 mg via ORAL
  Filled 2018-07-22 (×2): qty 2
  Filled 2018-07-22: qty 1
  Filled 2018-07-22 (×3): qty 2
  Filled 2018-07-22: qty 1
  Filled 2018-07-22: qty 2
  Filled 2018-07-22 (×3): qty 1

## 2018-07-22 MED ORDER — ONDANSETRON HCL 4 MG/2ML IJ SOLN
4.0000 mg | Freq: Once | INTRAMUSCULAR | Status: AC
  Administered 2018-07-22: 4 mg via INTRAVENOUS
  Filled 2018-07-22: qty 2

## 2018-07-22 MED ORDER — MIRTAZAPINE 15 MG PO TABS
15.0000 mg | ORAL_TABLET | Freq: Every day | ORAL | Status: DC
  Administered 2018-07-22 – 2018-07-30 (×9): 15 mg via ORAL
  Filled 2018-07-22 (×9): qty 1

## 2018-07-22 MED ORDER — ONDANSETRON HCL 4 MG/2ML IJ SOLN: 4.0000 mg | Freq: Four times a day (QID) | INTRAMUSCULAR | Status: DC | PRN

## 2018-07-22 MED ORDER — SODIUM CHLORIDE 0.9 % IV SOLN
2.0000 g | Freq: Once | INTRAVENOUS | Status: AC
  Administered 2018-07-22: 2 g via INTRAVENOUS
  Filled 2018-07-22: qty 2

## 2018-07-22 MED ORDER — HYDROCORTISONE NA SUCCINATE PF 100 MG IJ SOLR
50.0000 mg | Freq: Four times a day (QID) | INTRAMUSCULAR | Status: DC
  Administered 2018-07-22 – 2018-07-23 (×4): 50 mg via INTRAVENOUS
  Filled 2018-07-22 (×4): qty 2

## 2018-07-22 MED ORDER — FAMOTIDINE IN NACL 20-0.9 MG/50ML-% IV SOLN
20.0000 mg | Freq: Two times a day (BID) | INTRAVENOUS | Status: DC
  Administered 2018-07-22 – 2018-07-26 (×9): 20 mg via INTRAVENOUS
  Filled 2018-07-22 (×10): qty 50

## 2018-07-22 MED ORDER — LORAZEPAM 2 MG/ML IJ SOLN: 0.5000 mg | Freq: Four times a day (QID) | INTRAMUSCULAR | Status: DC | PRN

## 2018-07-22 NOTE — Progress Notes (Signed)
  Echocardiogram 2D Echocardiogram has been performed.  Toni ChimesWendy  Pranit Sullivan 07/22/2018, 3:41 PM

## 2018-07-22 NOTE — ED Provider Notes (Addendum)
[facility]-EMERGENCY DEPT Provider Note   CSN: [id number] Arrival date & time: 07/22/18  0235     History   Chief Complaint Chief Complaint  Patient presents with  . Fever    HPI LYNDON CHENOWETH is a 67 y.o. female.  HPI  This is a 67 year old female with a history of dementia, stroke, who presents with fever.  Patient presents from her living facility.  Staff noted temperature to 102.  Reported vomiting throughout the day.  Also noted to have decreased O2 sats on room air by EMS.  Was placed on nasal cannula.  Patient is delirious on exam and noncontributory to history taking.  At baseline has Alzheimer's dementia.  Level 5 caveat for altered mental status and dementia.  Past Medical History:  Diagnosis Date  . Dementia (HCC)   . Headache(784.0)   . Memory loss   . Stroke [facility])     Patient Active Problem List   Diagnosis Date Noted  . UTI (urinary tract infection) 06/12/2017  . Delirium 06/12/2017  . Aggressive behavior   . Urinary tract infection without hematuria   . Acute metabolic encephalopathy 02/03/2017  . Acute lower UTI 02/03/2017  . 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 History of CVA (cerebrovascular accident) 07/03/2013    Past Surgical History:  Procedure Laterality Date  . MOUTH SURGERY    . SKIN CANCER EXCISION       OB History   None      Home Medications    Prior to Admission medications   Medication Sig Start Date End Date Taking? Authorizing Provider  acetaminophen (TYLENOL) 325 MG tablet Take 650 mg by mouth every 8 (eight) hours as needed (for pain level 1-8 or temperature greater than 101 degrees).    [provider]  alum & mag hydroxide-simeth (MINTOX) 200-200-20 MG/5ML suspension Take 30 mLs by mouth as needed for indigestion or heartburn. Do not exceed 4 doses in 24 hours    [provider]  Cranberry 450 MG TABS Take 450 mg by  mouth daily.    [provider]  divalproex (DEPAKOTE SPRINKLE) 125 MG capsule Take 250 mg by mouth 3 (three) times daily. 06/18/17   [provider]  fluPHENAZine (PROLIXIN) 1 MG tablet Take 1 mg by mouth at bedtime. 08/01/17   [provider]  guaifenesin (ROBITUSSIN) 100 MG/5ML syrup Take 200 mg by mouth every 6 (six) hours as needed for cough.    [provider]  loperamide (IMODIUM) 2 MG capsule Take 2 mg by mouth as needed for diarrhea or loose stools. Do not exceed 8 doses in 24 hours    [provider]  LORazepam (ATIVAN) 0.5 MG tablet Take 0.5 mg by mouth every 6 (six) hours.  05/22/17   [provider]  LORAZEPAM IJ Apply 2 mg topically every 6 (six) hours as needed (severe agitation).    [provider]  magnesium hydroxide (MILK OF MAGNESIA) 400 MG/5ML suspension Take 30 mLs by mouth at bedtime as needed for mild constipation.    [provider]  Melatonin 3 MG TABS Take 3 mg by mouth at bedtime.    [provider]  mirtazapine (REMERON) 15 MG tablet Take 15 mg by mouth at bedtime.    [provider]  Neomycin-Bacitracin-Polymyxin (TRIPLE ANTIBIOTIC) [phone] OINT Apply 1 application topically as needed (minor skin tears or abrasions).    [provider]  Nutritional Supplements (  NUTRITIONAL DRINK PO) Take 236 mLs by mouth 3 (three) times daily. MIGHTY SHAKES     [provider]  PRESCRIPTION MEDICATION Apply 2 mg topically every 6 (six) hours as needed (severe agitation). Lorazepam 2 mg Gel    [provider]  risperiDONE (RISPERDAL) 0.5 MG tablet Take 0.5 mg by mouth every 6 (six) hours as needed (severe agitation).  12/01/17   [provider]  Rivastigmine (EXELON) 13.3 MG/24HR PT24 Apply 1 patch (13.3 mg total) topically daily. 09/01/16   Nilda Riggs, NP  sertraline (ZOLOFT) 25 MG tablet Take 75 mg by mouth daily. 07/24/17   [provider]     Family History History reviewed. No pertinent family history.  Social History Social History   Tobacco Use  . Smoking status: Never Smoker  . Smokeless tobacco: Never Used  Substance Use Topics  . Alcohol use: Yes    Comment: 1 glass daily  . Drug use: No     Allergies   Penicillins   Review of Systems Review of Systems  Unable to perform ROS: Dementia     Physical Exam Updated Vital Signs BP 95/62   Pulse 87   Temp (!) 92 F (33.3 C)   Resp (!) 26   Ht 1.702 m (5\' 7" )   Wt 65.8 kg   SpO2 100%   BMI 22.71 kg/m   Physical Exam  Constitutional:  Elderly, delirious  HENT:  Head: Normocephalic and atraumatic.  Mucous membranes dry  Eyes: Pupils are equal, round, and reactive to light.  Neck: Neck supple.  Cardiovascular: Normal rate, regular rhythm and normal heart sounds.  Pulmonary/Chest: Effort normal. No respiratory distress. She has no wheezes. She has rales.  Abdominal: Soft. Bowel sounds are normal. There is no tenderness.  Musculoskeletal: She exhibits no edema.  Neurological: She is alert.  Disoriented, moves all 4 extremities spontaneously, does not follow commands  Skin: Skin is warm and dry. No rash noted.  Psychiatric: She has a normal mood and affect.  Nursing note and vitals reviewed.    ED Treatments / Results  Labs (all labs ordered are listed, but only abnormal results are displayed) Labs Reviewed  COMPREHENSIVE METABOLIC PANEL - Abnormal; Notable for the following components:      Result Value   Glucose, Bld 141 (*)    All other components within normal limits  URINALYSIS, ROUTINE W REFLEX MICROSCOPIC - Abnormal; Notable for the following components:   APPearance HAZY (*)    Hgb urine dipstick MODERATE (*)    Ketones, ur 5 (*)    Leukocytes, UA LARGE (*)    WBC, UA >50 (*)    Bacteria, UA RARE (*)    All other components within normal limits  I-STAT CG4 LACTIC ACID, ED - Abnormal; Notable for the following components:    Lactic Acid, Venous 3.97 (*)    All other components within normal limits  CULTURE, BLOOD (ROUTINE X 2)  CULTURE, BLOOD (ROUTINE X 2)  URINE CULTURE  CBC WITH DIFFERENTIAL/PLATELET  LIPASE, BLOOD  LACTIC ACID, PLASMA  I-STAT CG4 LACTIC ACID, ED    EKG EKG Interpretation  Date/Time:  Sunday July 22 2018 03:41:34 EST Ventricular Rate:  89 PR Interval:    QRS Duration: 107 QT Interval:  390 QTC Calculation: 475 R Axis:   79 Text Interpretation:  Atrial fibrillation Minimal ST depression, anterolateral leads Artifact in lead(s) I III aVR aVL aVF V2 No prior for comparison Confirmed by Ross Marcus ([id number]) on  07/22/2018 4:03:12 AM   Radiology Dg Chest Port 1 View  Result Date: 07/22/2018 CLINICAL DATA:  Fever.  Sepsis. EXAM: PORTABLE CHEST 1 VIEW COMPARISON:  None. FINDINGS: Patient is rotated. Heart size and pulmonary vascularity are normal. Probable emphysematous changes in the lungs. No airspace disease or consolidation. No blunting of costophrenic angles. No pneumothorax. Chin mediastinal contours appear intact. IMPRESSION: Emphysematous changes in the lungs. No evidence of active pulmonary disease. Electronically Signed   By: Burman NievesWilliam  Stevens M.D.   On: 07/22/2018 03:34    Procedures Procedures (including critical care time)  CRITICAL CARE Performed by: Shon Batonourtney F Horton   Total critical care time: 60 minutes  Critical care time was exclusive of separately billable procedures and treating other patients.  Critical care was necessary to treat or prevent imminent or life-threatening deterioration.  Critical care was time spent personally by me on the following activities: development of treatment plan with patient and/or surrogate as well as nursing, discussions with consultants, evaluation of patient's response to treatment, examination of patient, obtaining history from patient or surrogate, ordering and performing treatments and interventions, ordering and review  of laboratory studies, ordering and review of radiographic studies, pulse oximetry and re-evaluation of patient's condition.  Angiocath insertion Performed by: Shon Batonourtney F Horton  Consent: Verbal consent obtained. Risks and benefits: risks, benefits and alternatives were discussed Time out: Immediately prior to procedure a "time out" was called to verify the correct patient, procedure, equipment, support staff and site/side marked as required.  Preparation: Patient was prepped and draped in the usual sterile fashion.  Vein Location: right basilic  Ultrasound Guided  Gauge: 20  Normal blood return and flush without difficulty Patient tolerance: Patient tolerated the procedure well with no immediate complications.     Medications Ordered in ED Medications  metroNIDAZOLE (FLAGYL) IVPB 500 mg ( Intravenous Rate/Dose Verify 07/22/18 0557)  vancomycin (VANCOCIN) IVPB 1000 mg/200 mL premix ( Intravenous Stopped 07/22/18 0522)  sodium chloride 0.9 % bolus 1,000 mL (0 mLs Intravenous Stopped 07/22/18 0446)  ceFEPIme (MAXIPIME) 2 g in sodium chloride 0.9 % 100 mL IVPB (0 g Intravenous Stopped 07/22/18 0425)  sodium chloride 0.9 % bolus 1,000 mL (0 mLs Intravenous Stopped 07/22/18 0538)  0.9 %  sodium chloride infusion ( Intravenous Rate/Dose Verify 07/22/18 0557)     Initial Impression / Assessment and Plan / ED Course  I have reviewed the triage vital signs and the nursing notes.  Pertinent labs & imaging results that were available during my care of the patient were reviewed by me and considered in my medical decision making (see chart for details).     Patient presents with fever and delirium.  She does not contribute to history taking.  Reported vomiting at living facility.  Temperature here 101.3 rectally.  Sepsis work-up was initiated.  She was initially given broad-spectrum antibiotics.  She was given 1 L of fluids.  Lactate came back at 3.97.  For this reason, patient was given  an additional liter of fluids to make her 30 cc/kg.  Initially blood pressures in the low 100s.  Patient did drift downward with the lowest blood pressure 85/58.  She is fluid responsive.  After 2 L of fluid, she was started on 100cc/h of normal saline.  Chest x-ray shows no evidence of pneumonia.  Her lab work-up is largely reassuring.  Notes leukocytosis and no significant metabolic derangement.  Urinalysis does show large leukocytes with greater than 50 white cells and bacteria with white blood cell  clumps.    6:22 AM Husband is at the bedside.  He was updated.  Will admit for severe sepsis.  At this time she does not appear in shock as she is fluid responsive but her blood pressure remains soft.  Repeat lactate is pending.  Husband confirms DNR.  Sepsis - Repeat Assessment  Performed at:    0622  Vitals     Blood pressure 95/62, pulse 87, temperature 101.3, resp. rate (!) 26, height 1.702 m (5\' 7" ), weight 65.8 kg, SpO2 100 %.  Heart:     Regular rate and rhythm  Lungs:    Rales  Capillary Refill:   <2 sec  Peripheral Pulse:   Radial pulse palpable  Skin:     Pale   6:45 AM Discussed with hospitalist.  We will plan for admission to the stepdown unit.  She remains febrile to 102.4.  Rectal Tylenol ordered.  Repeat lactate is essentially unchanged at 3.8.  Patient was given 1 additional liter of fluids.  I was able to obtain additional access.  While was in the room, patient had episode of emesis.  High aspiration risk.  GIven Zofran  She was suctioned aggressively.  O2 sats remained 100%.  I have updated the husband at the bedside.  INfluenza screen added to w/u.   Final Clinical Impressions(s) / ED Diagnoses   Final diagnoses:  Severe sepsis (HCC)  Acute cystitis without hematuria  Delirium  Atrial fibrillation, unspecified type [facility])    ED Discharge Orders    None       Horton, Mayer Masker, MD 07/22/18 1610    Shon Baton, MD 07/22/18 9604    Shon Baton, MD 07/22/18 5409    Shon Baton, MD 07/23/18 [phone]

## 2018-07-22 NOTE — ED Notes (Signed)
ED TO INPATIENT HANDOFF REPORT  Name/Age/Gender Toni Sullivan 67 y.o. female  Code Status    Code Status Orders  (From admission, onward)         Start     Ordered   07/22/18 0715  Do not attempt resuscitation (DNR)  Continuous    Question Answer Comment  In the event of cardiac or respiratory ARREST Do not call a "code blue"   In the event of cardiac or respiratory ARREST Do not perform Intubation, CPR, defibrillation or ACLS   In the event of cardiac or respiratory ARREST Use medication by any route, position, wound care, and other measures to relive pain and suffering. May use oxygen, suction and manual treatment of airway obstruction as needed for comfort.      07/22/18 0715        Code Status History    Date Active Date Inactive Code Status Order ID Comments User Context   06/12/2017 1942 06/15/2017 1922 DNR [id number]  Elodia Florence., MD Inpatient   02/03/2017 1750 02/16/2017 1832 Full Code [id number]  Robbie Lis, MD Inpatient    Advance Directive Documentation     Most Recent Value  Type of Advance Directive  Out of facility DNR (pink MOST or yellow form)  Pre-existing out of facility DNR order (yellow form or pink MOST form)  -  "MOST" Form in Place?  -      Home/SNF/Other Nursing Home  Chief Complaint fever  Level of Care/Admitting Diagnosis ED Disposition    ED Disposition Condition [place]: [place] [[id number]]  Level of Care: Stepdown [14]  Admit to SDU based on following criteria: Severe physiological/psychological symptoms:  Any diagnosis requiring assessment & intervention at least every 4 hours on an ongoing basis to obtain desired patient outcomes including stability and rehabilitation  Diagnosis: Sepsis secondary to UTI [facility]) [[id number]]  Admitting Physician: Vianne Bulls [[id number]]  Attending Physician: Vianne Bulls [[id number]]  Estimated length of stay: past midnight tomorrow  Certification:: I certify this patient will need inpatient services for at least 2 midnights  PT Class (Do Not Modify): Inpatient [101]  PT Acc Code (Do Not Modify): Private [1]       Medical History Past Medical History:  Diagnosis Date  . Dementia ([place])   . Headache(784.0)   . Memory loss   . Stroke [facility])     Allergies Allergies  Allergen Reactions  . Penicillins Other (See Comments)    Unknown reaction Has patient had a PCN reaction causing immediate rash, facial/tongue/throat swelling, SOB or lightheadedness with hypotension: Unknown Has patient had a PCN reaction causing severe rash involving mucus membranes or skin necrosis: Unknown Has patient had a PCN reaction that required hospitalization: Unknown Has patient had a PCN reaction occurring within the last 10 years: No If all of the above answers are "NO", then may proceed with Cephalosporin use.     IV Location/Drains/Wounds Patient Lines/Drains/Airways Status   Active Line/Drains/Airways    Name:   Placement date:   Placement time:   Site:   Days:   Peripheral IV 07/22/18 Left Forearm   07/22/18    -    Forearm   less than 1   Peripheral IV 07/22/18 Right Arm   07/22/18    0644    Arm   less than 1          Labs/Imaging Results for orders placed or performed during the  hospital encounter of 07/22/18 (from the past 48 hour(s))  Comprehensive metabolic panel     Status: Abnormal   Collection Time: 07/22/18  3:36 AM  Result Value Ref Range   Sodium 141 135 - 145 mmol/L   Potassium 4.5 3.5 - 5.1 mmol/L   Chloride 106 98 - 111 mmol/L   CO2 24 22 - 32 mmol/L   Glucose, Bld 141 (H) 70 - 99 mg/dL   BUN 19 8 - 23 mg/dL   Creatinine, Ser 0.85 0.44 - 1.00 mg/dL   Calcium 8.9 8.9 - 10.3 mg/dL   Total Protein 6.8 6.5 - 8.1 g/dL   Albumin 3.6 3.5 - 5.0 g/dL   AST 36 15 - 41 U/L   ALT 17 0 - 44 U/L   Alkaline Phosphatase 78 38 - 126 U/L   Total Bilirubin 0.5 0.3 - 1.2 mg/dL   GFR calc non Af Amer >60 >60 mL/min    GFR calc Af Amer >60 >60 mL/min    Comment: (NOTE) The eGFR has been calculated using the CKD EPI equation. This calculation has not been validated in all clinical situations. eGFR's persistently <60 mL/min signify possible Chronic Kidney Disease.    Anion gap 11 5 - 15    Comment: Performed at [facility], [place] [address] [place], [place] [address]  CBC WITH DIFFERENTIAL     Status: None   Collection Time: 07/22/18  3:36 AM  Result Value Ref Range   WBC 8.6 4.0 - 10.5 K/uL   RBC 4.82 3.87 - 5.11 MIL/uL   Hemoglobin 13.1 12.0 - 15.0 g/dL   HCT 40.9 36.0 - 46.0 %   MCV 84.9 80.0 - 100.0 fL   MCH 27.2 26.0 - 34.0 pg   MCHC 32.0 30.0 - 36.0 g/dL   RDW 13.2 11.5 - 15.5 %   Platelets 154 150 - 400 K/uL   nRBC 0.0 0.0 - 0.2 %   Neutrophils Relative % 80 %   Neutro Abs 6.9 1.7 - 7.7 K/uL   Lymphocytes Relative 13 %   Lymphs Abs 1.1 0.7 - 4.0 K/uL   Monocytes Relative 7 %   Monocytes Absolute 0.6 0.1 - 1.0 K/uL   Eosinophils Relative 0 %   Eosinophils Absolute 0.0 0.0 - 0.5 K/uL   Basophils Relative 0 %   Basophils Absolute 0.0 0.0 - 0.1 K/uL   Immature Granulocytes 0 %   Abs Immature Granulocytes 0.03 0.00 - 0.07 K/uL    Comment: Performed at [facility], [place] [address] [place], [place] [address]  Lipase, blood     Status: None   Collection Time: 07/22/18  3:36 AM  Result Value Ref Range   Lipase 30 11 - 51 U/L    Comment: Performed at [facility], [place] [address] [place], [place] [address]  I-Stat CG4 Lactic Acid, ED     Status: Abnormal   Collection Time: 07/22/18  3:42 AM  Result Value Ref Range   Lactic Acid, Venous 3.97 (HH) 0.5 - 1.9 mmol/L   Comment NOTIFIED PHYSICIAN   Urinalysis, Routine w reflex microscopic     Status: Abnormal   Collection Time: 07/22/18  5:17 AM  Result Value Ref Range   Color, Urine YELLOW YELLOW   APPearance HAZY (A) CLEAR   Specific Gravity, Urine 1.015 1.005 - 1.030   pH 7.0 5.0 -  8.0   Glucose, UA NEGATIVE NEGATIVE mg/dL   Hgb urine dipstick MODERATE (A) NEGATIVE  Bilirubin Urine NEGATIVE NEGATIVE   Ketones, ur 5 (A) NEGATIVE mg/dL   Protein, ur NEGATIVE NEGATIVE mg/dL   Nitrite NEGATIVE NEGATIVE   Leukocytes, UA LARGE (A) NEGATIVE   RBC / HPF 11-20 0 - 5 RBC/hpf   WBC, UA >50 (H) 0 - 5 WBC/hpf   Bacteria, UA RARE (A) NONE SEEN   WBC Clumps PRESENT    Mucus PRESENT     Comment: Performed at [facility], [place] [address], [place], [place] [address]  I-Stat CG4 Lactic Acid, ED     Status: Abnormal   Collection Time: 07/22/18  6:20 AM  Result Value Ref Range   Lactic Acid, Venous 3.84 (HH) 0.5 - 1.9 mmol/L   Comment NOTIFIED PHYSICIAN   Influenza panel by PCR (type A & B)     Status: None   Collection Time: 07/22/18  6:54 AM  Result Value Ref Range   Influenza A By PCR NEGATIVE NEGATIVE   Influenza B By PCR NEGATIVE NEGATIVE    Comment: (NOTE) The Xpert Xpress Flu assay is intended as an aid in the diagnosis of  influenza and should not be used as a sole basis for treatment.  This  assay is FDA approved for nasopharyngeal swab specimens only. Nasal  washings and aspirates are unacceptable for Xpert Xpress Flu testing. Performed at [facility], [place] [address] [place], [place] [address]    Dg Chest Port 1 View  Result Date: 07/22/2018 CLINICAL DATA:  Fever.  Sepsis. EXAM: PORTABLE CHEST 1 VIEW COMPARISON:  None. FINDINGS: Patient is rotated. Heart size and pulmonary vascularity are normal. Probable emphysematous changes in the lungs. No airspace disease or consolidation. No blunting of costophrenic angles. No pneumothorax. Chin mediastinal contours appear intact. IMPRESSION: Emphysematous changes in the lungs. No evidence of active pulmonary disease. Electronically Signed   By: Lucienne Capers M.D.   On: 07/22/2018 03:34   EKG Interpretation  Date/Time:  'Sunday July 22 2018 03:41:34 EST Ventricular Rate:  89 PR  Interval:    QRS Duration: 107 QT Interval:  390 QTC Calculation: 475 R Axis:   79 Text Interpretation:  Atrial fibrillation Minimal ST depression, anterolateral leads Artifact in lead(s) I III aVR aVL aVF V2 No prior for comparison Confirmed by Horton, Courtney ([id number]) on 07/22/2018 4:03:12 AM   Pending Labs Unresulted Labs (From admission, onward)    Start     Ordered   07/22/18 0724  Procalcitonin - Baseline  ONCE - STAT,   STAT     07/22/18 0723   07/22/18 0722  MRSA PCR Screening  Once,   R    Question:  Patient immune status  Answer:  Normal   07/22/18 0721   07/22/18 0618  Urine culture  ONCE - STAT,   STAT     07/22/18 0617   07/22/18 0310  Blood Culture (routine x 2)  BLOOD CULTURE X 2,   STAT     11'$ /24/19 0311          Vitals/Pain Today's Vitals   07/22/18 0703 07/22/18 0730 07/22/18 0800 07/22/18 0830  BP: (!) 89/56 97/64 (!) 86/51 (!) 83/50  Pulse: 86 86 87 86  Resp: (!) 25 (!) 34 19 (!) 21  Temp:      TempSrc:      SpO2: 93% 91% 90% 92%  Weight:      Height:        Isolation Precautions Droplet precaution  Medications Medications  metroNIDAZOLE (FLAGYL) IVPB  500 mg (0 mg Intravenous Stopped 07/22/18 0645)  sodium chloride 0.9 % bolus 1,000 mL (1,000 mLs Intravenous Total Dose 07/22/18 0743)  albumin human 25 % solution 50 g (50 g Intravenous New Bag/Given 07/22/18 0819)  cefTRIAXone (ROCEPHIN) 1 g in sodium chloride 0.9 % 100 mL IVPB (has no administration in time range)  ondansetron (ZOFRAN) injection 4 mg (has no administration in time range)  aspirin suppository 300 mg (has no administration in time range)  LORazepam (ATIVAN) injection 0.5 mg (has no administration in time range)  acetaminophen (TYLENOL) suppository 650 mg (has no administration in time range)  albumin human 25 % solution 50 g (50 g Intravenous Transfusing/Transfer 07/22/18 0841)  vancomycin (VANCOCIN) IVPB 1000 mg/200 mL premix ( Intravenous Stopped 07/22/18 0522)  sodium chloride  0.9 % bolus 1,000 mL (0 mLs Intravenous Stopped 07/22/18 0446)  ceFEPIme (MAXIPIME) 2 g in sodium chloride 0.9 % 100 mL IVPB (0 g Intravenous Stopped 07/22/18 0425)  sodium chloride 0.9 % bolus 1,000 mL (0 mLs Intravenous Stopped 07/22/18 0538)  0.9 %  sodium chloride infusion ( Intravenous Stopped 07/22/18 0726)  sodium chloride 0.9 % bolus 1,000 mL (0 mLs Intravenous Stopped 07/22/18 0809)  acetaminophen (TYLENOL) suppository 650 mg (650 mg Rectal Given 07/22/18 0645)  ondansetron (ZOFRAN) injection 4 mg (4 mg Intravenous Given 07/22/18 0645)  0.9 %  sodium chloride infusion ( Intravenous Transfusing/Transfer 07/22/18 0841)    Mobility manual wheelchair

## 2018-07-22 NOTE — ED Notes (Signed)
Bed: [id number] Expected date:  Expected time:  Means of arrival:  Comments: 67 yo F/ febrile- poss sepsis

## 2018-07-22 NOTE — ED Notes (Signed)
I have just phoned report to Ginger, RN in ICU. I will transport her now.

## 2018-07-22 NOTE — ED Notes (Signed)
Unable to obtain accurate EKG reading due to pt movement

## 2018-07-22 NOTE — Progress Notes (Signed)
In the ICU: Difficulty maintaining MAP above 65 despite aggressive IV fluid/albumin resuscitation. Added vasopressors and solu-cortef.   Continue to closely monitor VS.  Husband updated in the unit.  Code Status: DNR.

## 2018-07-22 NOTE — H&P (Signed)
History and Physical  Toni Sullivan WUJ:[id number] DOB: [birth year] DOA: 07/22/2018  Referring physician: Dr Wilkie Aye (ED), [facility])  PCP: Excell Seltzer, MD  Outpatient Specialists: None Patient coming from: SNF  Chief Complaint: Vomiting and fever at SNF  HPI: Toni Sullivan is a 67 y.o. female with medical history significant for dementia with aggressive behavior, TIA, CVA with no focal deficits, depression/anxiety, who presented to [facility] ED from SNF with vomiting and a fever of 102.  History is obtained from her husband at bedside.  Who revealed symptoms started yesterday with altered mental status, agitation, and vomiting.  Temperature at SNF was elevated.  Due to persistent vomiting and fever patient was brought in early this morning for further evaluation.  Per her husband, her baseline is conversational in the setting of dementia and ambulatory slowly but without any assistance.  She was in her normal state of health prior to this.    ED Course: Upon presentation to the ED, the patient is febrile with T-max of 102.4, hypotensive, sepsis code activated.  Received 4 L of IV fluid NS with mild improvement of her blood pressure which remained with map under 65.  Urine analysis positive for UTI.  Other lab studies are essentially unremarkable.  Noted EKG revealed A. fib.  Patient has no prior history of A. Fib.  TRH asked to admit.  Review of Systems: Review of systems as noted in the HPI. All other systems reviewed and are negative.   Past Medical History:  Diagnosis Date  . Dementia (HCC)   . Headache(784.0)   . Memory loss   . Stroke [facility])    Past Surgical History:  Procedure Laterality Date  . MOUTH SURGERY    . SKIN CANCER EXCISION      Social History:  reports that she has never smoked. She has never used smokeless tobacco. She reports that she drinks alcohol. She reports that she does not use drugs.   Allergies  Allergen Reactions  . Penicillins Other (See Comments)    Unknown reaction Has patient had a PCN reaction causing immediate rash, facial/tongue/throat swelling, SOB or lightheadedness with hypotension: Unknown Has patient had a PCN reaction causing severe rash involving mucus membranes or skin necrosis: Unknown Has patient had a PCN reaction that required hospitalization: Unknown Has patient had a PCN reaction occurring within the last 10 years: No If all of the above answers are "NO", then may proceed with Cephalosporin use.     History reviewed. No pertinent family history.  Mother still alive with dementia.  Prior to Admission medications   Medication Sig Start Date End Date Taking? Authorizing Provider  acetaminophen (TYLENOL) 325 MG tablet Take 650 mg by mouth every 8 (eight) hours as needed (for pain level 1-8 or temperature greater than 101 degrees).    [provider]  alum & mag hydroxide-simeth (MINTOX) 200-200-20 MG/5ML suspension Take 30 mLs by mouth as needed for indigestion or heartburn. Do not exceed 4 doses in 24 hours    [provider]  Cranberry 450 MG TABS Take 450 mg by mouth daily.    [provider]  divalproex (DEPAKOTE SPRINKLE) 125 MG capsule Take 250 mg by mouth 3 (three) times daily. 06/18/17   [provider]  fluPHENAZine (PROLIXIN) 1 MG tablet Take 1 mg by mouth at bedtime. 08/01/17   [provider]  guaifenesin (ROBITUSSIN) 100 MG/5ML syrup Take 200 mg by mouth every 6 (six) hours as needed for cough.  [provider]  loperamide (IMODIUM) 2 MG capsule Take 2 mg by mouth as needed for diarrhea or loose stools. Do not exceed 8 doses in 24 hours    [provider]  LORazepam (ATIVAN) 0.5 MG tablet Take 0.5 mg by mouth every 6 (six) hours.  05/22/17   [provider]  LORAZEPAM IJ Apply 2 mg topically every 6 (six) hours as needed (severe agitation).    [provider]  magnesium hydroxide (MILK OF MAGNESIA) 400 MG/5ML suspension Take 30  mLs by mouth at bedtime as needed for mild constipation.    [provider]  Melatonin 3 MG TABS Take 3 mg by mouth at bedtime.    [provider]  mirtazapine (REMERON) 15 MG tablet Take 15 mg by mouth at bedtime.    [provider]  Neomycin-Bacitracin-Polymyxin (TRIPLE ANTIBIOTIC) 3.5-408 079 3559 OINT Apply 1 application topically as needed (minor skin tears or abrasions).    [provider]  Nutritional Supplements (NUTRITIONAL DRINK PO) Take 236 mLs by mouth 3 (three) times daily. MIGHTY SHAKES     [provider]  PRESCRIPTION MEDICATION Apply 2 mg topically every 6 (six) hours as needed (severe agitation). Lorazepam 2 mg Gel    [provider]  risperiDONE (RISPERDAL) 0.5 MG tablet Take 0.5 mg by mouth every 6 (six) hours as needed (severe agitation).  12/01/17   [provider]  Rivastigmine (EXELON) 13.3 MG/24HR PT24 Apply 1 patch (13.3 mg total) topically daily. 09/01/16   Nilda RiggsMartin, Nancy Carolyn, NP  sertraline (ZOLOFT) 25 MG tablet Take 75 mg by mouth daily. 07/24/17   [provider]    Physical Exam: BP (!) 89/56 (BP Location: Left Arm)   Pulse 86   Temp (!) 102.4 F (39.1 C) (Axillary)   Resp (!) 25   Ht 5\' 7"  (1.702 m)   Wt 65.8 kg   SpO2 93%   BMI 22.71 kg/m   . General: 67 y.o. year-old female well developed well nourished somnolent and arousable to voices. . Cardiovascular: Regular rate and rhythm with no rubs or gallops.  No thyromegaly or JVD noted.  No lower extremity edema. 2/4 pulses in all 4 extremities. Marland Kitchen. Respiratory: Mild rales at bases with no wheezes.  Poor inspiratory effort. . Abdomen: Soft nontender nondistended with normal bowel sounds x4 quadrants. . Muskuloskeletal: No cyanosis, clubbing or edema noted bilaterally . Neuro: CN II-XII intact, strength, sensation, reflexes . Skin: No apparent ulcerative lesions noted or rashes . Psychiatry: Judgement and insight altered. Mood is appropriate  for condition and setting          Labs on Admission:  Basic Metabolic Panel: Recent Labs  Lab 07/22/18 0336  NA 141  K 4.5  CL 106  CO2 24  GLUCOSE 141*  BUN 19  CREATININE 0.85  CALCIUM 8.9   Liver Function Tests: Recent Labs  Lab 07/22/18 0336  AST 36  ALT 17  ALKPHOS 78  BILITOT 0.5  PROT 6.8  ALBUMIN 3.6   Recent Labs  Lab 07/22/18 0336  LIPASE 30   No results for input(s): AMMONIA in the last 168 hours. CBC: Recent Labs  Lab 07/22/18 0336  WBC 8.6  NEUTROABS 6.9  HGB 13.1  HCT 40.9  MCV 84.9  PLT 154   Cardiac Enzymes: No results for input(s): CKTOTAL, CKMB, CKMBINDEX, TROPONINI in the last 168 hours.  BNP (last 3 results) No results for input(s): BNP in the last 8760 hours.  ProBNP (last 3 results)  No results for input(s): PROBNP in the last 8760 hours.  CBG: No results for input(s): GLUCAP in the last 168 hours.  Radiological Exams on Admission: Dg Chest Port 1 View  Result Date: 07/22/2018 CLINICAL DATA:  Fever.  Sepsis. EXAM: PORTABLE CHEST 1 VIEW COMPARISON:  None. FINDINGS: Patient is rotated. Heart size and pulmonary vascularity are normal. Probable emphysematous changes in the lungs. No airspace disease or consolidation. No blunting of costophrenic angles. No pneumothorax. Chin mediastinal contours appear intact. IMPRESSION: Emphysematous changes in the lungs. No evidence of active pulmonary disease. Electronically Signed   By: Burman Nieves M.D.   On: 07/22/2018 03:34    EKG: I independently viewed the EKG done and my findings are as followed: A. fib with rate of 89.  No specific ST-T changes.  Assessment/Plan Present on Admission: . Sepsis secondary to UTI [facility])  Active Problems:   Sepsis secondary to UTI (HCC)  Severe sepsis secondary to UTI Febrile with T-max of 102.4, hypotension with map under 65 in the setting of aggressive fluid resuscitation, positive UA, altered mental status Started IV Rocephin Awaiting results  of blood cultures x2 and urine culture Admitted to stepdown unit Continue IV fluid resuscitation Add Albumin 50 g every 6 hours x 4 doses Maintain map above 65  Acute metabolic encephalopathy secondary to UTI Management as stated above Per her husband at baseline she is conversational and ambulates freely without any assistance Reorient as indicated N.p.o. until more alert Monitor vital signs  Onset A. fib suspect paroxysmal A. fib No prior history of A. fib Chads Vasc score 2 Currently rate is controlled Hold off anticoagulation for now Start aspirin Obtain 2D echo  Dementia with mood disorder Resume p.o. medications when more alert Monitor for agitation  Chronic depression/anxiety Resume outpatient medications when able to swallow safely IV Ativan as needed  Prior history of CVA Per husband patient is able to ambulate without any assistance however walks slowly Fall precautions   DVT prophylaxis: Subcu Lovenox daily  Code Status: DNR per her husband  Family Communication: Husband at bedside.  All questions answered to his satisfaction.  Disposition Plan: Admit to stepdown unit  Consults called: None  Admission status: Inpatient status  Patient will require at least 2 midnights for further evaluation and treatment of present condition.  Patient has severe sepsis requiring aggressive IV fluid resuscitation and IV antibiotics, patient has altered mental status with acute metabolic encephalopathy secondary to UTI with other comorbidities and advanced age.    Darlin Drop MD [facility] Pager [phone]  If 7PM-7AM, please contact night-coverage [email or web address] Password [email or web address]  07/22/2018, 7:23 AM

## 2018-07-22 NOTE — ED Notes (Signed)
Waiting for patient to rehydrate before attempting In and out cath per MD order.

## 2018-07-22 NOTE — ED Notes (Addendum)
IV access attempted multiple times with and without ultrasound all unsuccessful. Labs and one set of blood cultures able to be obtained at this time.

## 2018-07-22 NOTE — ED Notes (Signed)
Per direct order of the hospitalist, I begin a 1000 ml IV bolus of NSS. This will be her 5th liter.

## 2018-07-22 NOTE — Progress Notes (Signed)
Late entry nursing note .Marland Kitchen. PIV rt upper arm swollen, meds stopped attempted to withdrawal fluids to check for blood return.  No blood returned, skin blanched.  Extravasation Protocol ordered and Dr. Margo AyeHall notified. Area of swelling marked injected with Phentolamine along the edge of ? Extravasation per protocol. Arm elevated on pillow, warm dry heat pack to area x 15 minutes..  Family at bedside updated, notified of ? Infiltration.  IV team notified.

## 2018-07-22 NOTE — Consult Note (Signed)
NAME:  Toni Sullivan, MRN:  [id number], DOB:  07/14/1951, LOS: 0 ADMISSION DATE:  07/22/2018, CONSULTATION DATE: 07/22/2018 REFERRING MD: Dr. Margo AyeHall, TRH, CHIEF COMPLAINT: Shock  Brief History   67 year old woman with Alzheimer's dementia, history of CVA, admitted with apparent urinary tract infection associated septic shock.  Partial response to IV fluids but continues to be hypotensive, now on norepinephrine infusion.   History of present illness   67 year old woman with little past medical history of beyond history of TIA/stroke, long-standing Alzheimer's dementia with some associated aggressive behavior.  She was noted to have suppressed sensorium, nausea vomiting and a fever of 102 at her SNF.  She was brought for evaluation and found to be febrile, hypotensive.  Her urinalysis was consistent with a probable urinary tract infection.  Other labs are for the most part unremarkable.  Her EKG did show atrial fibrillation which is apparently new.  She received aggressive volume resuscitation, empiric antibiotics, has now completed 6 L IV fluid and is receiving some albumin.  Her lactic acid did clear with these interventions and she is now more interactive.  Her blood pressures remain low and she was started empirically on stress dose steroids, norepinephrine.  Her norepinephrine dose was as high as 40, has now been weaned to 10 and is running through a peripheral IV.  PCCM consulted to assist with her management  Past Medical History   Past Medical History:  Diagnosis Date  . Dementia (HCC)   . Headache(784.0)   . Memory loss   . Stroke [facility](HCC)      Significant Hospital Events   Admitted septic shock, UTI 11/24  Consults:  PCCM 11/24  Procedures:    Significant Diagnostic Tests:  Chest x-ray 11/24 >> no infiltrates noted Abdominal x-ray 11/24 >> normal bowel gas pattern, increased stool in the rectum, some calcification projecting over the inferior pole of the right kidney, large  calcification in the right upper quadrant over the mid right kidney, suspected renal calculus  Micro Data:  Urine 11/24 >>  Blood 11/24 >>   Antimicrobials:  Vancomycin x1 11/24 Cefepime x1  11/24 Ceftriaxone 11/24 Flagyl 11/24  Interim history/subjective:  Patient is a little bit more interactive, is mumbling but is coherent.  Her husband states that this is close to her neurological baseline.  She was able to tell me that she is not having pain Norepinephrine has been weaned to 10 the blood pressure remains marginal She is developing some urine output, somewhat concentrated  Objective   Blood pressure (!) 81/47, pulse 73, temperature (!) 102.4 F (39.1 C), temperature source Axillary, resp. rate 20, height 5\' 7"  (1.702 m), weight 65.8 kg, SpO2 94 %.        Intake/Output Summary (Last 24 hours) at 07/22/2018 1139 Last data filed at 07/22/2018 0809 Gross per 24 hour  Intake 3457.99 ml  Output -  Net 3457.99 ml   Filed Weights   07/22/18 0353  Weight: 65.8 kg    Examination: General: Chronically ill-appearing elderly woman, lying comfortably in bed, some mumbling and calling out HENT: Oropharynx clear, moist without lesions, pupils equal Lungs: Clear bilaterally, somewhat distant, no crackles or wheezes Cardiovascular: Regular, no murmur, appears to be back in normal sinus rhythm by telemetry Abdomen: Soft, nontender, positive bowel sounds Extremities: No significant edema Neuro: Awake, will interact although some of her words are nonsensical, she was able to answer simple questions, she is not oriented, moves all extremities GU: Pure wick in place  Resolved  Hospital Problem list     Assessment & Plan:  Septic shock, presumed source urinary tract infection.  Some interval improvement with volume resuscitation and antibiotics, lactic acidosis improved, urine output starting and mental status improving.  Remains pressor dependent -Good response to aggressive volume  resuscitation with improvement in some indices but still hypotensive.  Discussed with her husband at bedside.  The patient is DNR but he is okay with medical interventions such as her current norepinephrine.  If her pressor needs to go up then we would have to decide about central line placement.  He is contemplating this.  As long as she is adequate urine output, her mental status stays at current baseline I would be willing to tolerate SBP greater than 85 and or MAP greater than 60 -Agree with stress dose steroids empirically -Can likely simplify antibiotics to ceftriaxone (note penicillin allergy), discontinue Flagyl and absence of any evidence of bowel wall thickening on her abdominal x-ray -Follow lactate, procalcitonin, culture data  Acute on chronic encephalopathy, due to sepsis and UTI -Supportive care as above  New onset atrial fibrillation, likely stress response.  Appears to be back in NSR now -Not currently tachycardic. Following telemetry, treat underlying contributors  At risk acute renal failure in setting of sepsis Lactic acidosis, cleared -Follow BMP, urine output  Dementia with baseline mood disorder, depression and anxiety -Hold her home Ativan, watch for any signs of withdrawal -If able to take p.o. then continue her home Depakote, Remeron, Risperdal, Zoloft  Prior history CVA/TIA On aspirin   Best practice:  Diet: NPO Pain/Anxiety/Delirium protocol (if indicated): NA VAP protocol (if indicated): NA DVT prophylaxis: Enoxaparin GI prophylaxis: Pepcid Glucose control: NA Mobility: Bedrest Code Status: DNR, pressors okay per discussions with the patient's husband at bedside Family Communication: Prognosis, goals for care discussed with husband 11/24 Disposition: ICU  Labs   CBC: Recent Labs  Lab 07/22/18 0336  WBC 8.6  NEUTROABS 6.9  HGB 13.1  HCT 40.9  MCV 84.9  PLT 154    Basic Metabolic Panel: Recent Labs  Lab 07/22/18 0336  NA 141  K 4.5  CL  106  CO2 24  GLUCOSE 141*  BUN 19  CREATININE 0.85  CALCIUM 8.9   GFR: Estimated Creatinine Clearance: 60.7 mL/min (by C-G formula based on SCr of 0.85 mg/dL). Recent Labs  Lab 07/22/18 0336 07/22/18 0342 07/22/18 0620 07/22/18 0725 07/22/18 0934  PROCALCITON  --   --   --  5.91  --   WBC 8.6  --   --   --   --   LATICACIDVEN  --  3.97* 3.84*  --  1.7    Liver Function Tests: Recent Labs  Lab 07/22/18 0336  AST 36  ALT 17  ALKPHOS 78  BILITOT 0.5  PROT 6.8  ALBUMIN 3.6   Recent Labs  Lab 07/22/18 0336  LIPASE 30   No results for input(s): AMMONIA in the last 168 hours.  ABG No results found for: PHART, PCO2ART, PO2ART, HCO3, TCO2, ACIDBASEDEF, O2SAT   Coagulation Profile: No results for input(s): INR, PROTIME in the last 168 hours.  Cardiac Enzymes: No results for input(s): CKTOTAL, CKMB, CKMBINDEX, TROPONINI in the last 168 hours.  HbA1C: No results found for: HGBA1C  CBG: No results for input(s): GLUCAP in the last 168 hours.  Review of Systems:   Patient denies pain, otherwise unable to obtain  Past Medical History  She,  has a past medical history of Dementia (HCC), Headache(784.0), Memory  loss, and Stroke [facility]).   Surgical History    Past Surgical History:  Procedure Laterality Date  . MOUTH SURGERY    . SKIN CANCER EXCISION       Social History   reports that she has never smoked. She has never used smokeless tobacco. She reports that she drinks alcohol. She reports that she does not use drugs.   Family History   Her family history is not on file.   Allergies Allergies  Allergen Reactions  . Penicillins Other (See Comments)    Unknown reaction Has patient had a PCN reaction causing immediate rash, facial/tongue/throat swelling, SOB or lightheadedness with hypotension: Unknown Has patient had a PCN reaction causing severe rash involving mucus membranes or skin necrosis: Unknown Has patient had a PCN reaction that required  hospitalization: Unknown Has patient had a PCN reaction occurring within the last 10 years: No If all of the above answers are "NO", then may proceed with Cephalosporin use.      Home Medications  Prior to Admission medications   Medication Sig Start Date End Date Taking? Authorizing Provider  acetaminophen (TYLENOL) 500 MG tablet Take 500 mg by mouth every 4 (four) hours as needed (for pain level 1-8 or temperature greater than 101 degrees).    Yes [provider]  alum & mag hydroxide-simeth (MINTOX) 200-200-20 MG/5ML suspension Take 30 mLs by mouth as needed for indigestion or heartburn. Do not exceed 4 doses in 24 hours   Yes [provider]  Cranberry 450 MG TABS Take 450 mg by mouth daily.   Yes [provider]  divalproex (DEPAKOTE SPRINKLE) 125 MG capsule Take 250 mg by mouth 3 (three) times daily.   Yes [provider]  fluPHENAZine (PROLIXIN) 1 MG tablet Take 1 mg by mouth at bedtime. 08/01/17  Yes [provider]  guaifenesin (ROBITUSSIN) 100 MG/5ML syrup Take 200 mg by mouth every 6 (six) hours as needed for cough.   Yes [provider]  loperamide (IMODIUM) 2 MG capsule Take 2 mg by mouth as needed for diarrhea or loose stools. Do not exceed 8 doses in 24 hours   Yes [provider]  LORazepam (ATIVAN) 0.5 MG tablet Take 0.5 mg by mouth every 6 (six) hours.  05/22/17  Yes [provider]  magnesium hydroxide (MILK OF MAGNESIA) 400 MG/5ML suspension Take 30 mLs by mouth at bedtime as needed for mild constipation.   Yes [provider]  Melatonin 3 MG TABS Take 3 mg by mouth at bedtime.   Yes [provider]  mirtazapine (REMERON) 15 MG tablet Take 15 mg by mouth at bedtime.   Yes [provider]  Neomycin-Bacitracin-Polymyxin (TRIPLE ANTIBIOTIC) [phone] OINT Apply 1 application topically as needed (minor skin tears or abrasions).   Yes [provider]  Nutritional  Supplements (NUTRITIONAL DRINK PO) Take 236 mLs by mouth 3 (three) times daily. MIGHTY SHAKES    Yes [provider]  PRESCRIPTION MEDICATION Apply 2 mg topically every 6 (six) hours as needed (severe agitation). Lorazepam 1 mg Gel   Yes [provider]  risperiDONE (RISPERDAL) 0.5 MG tablet Take 0.5 mg by mouth every 6 (six) hours as needed (severe agitation).  12/01/17  Yes [provider]  Rivastigmine (EXELON) 13.3 MG/24HR PT24 Apply 1 patch (13.3 mg total) topically daily. 09/01/16  Yes Nilda Riggs, NP  sertraline (ZOLOFT) 25 MG tablet Take 75 mg by mouth daily. 07/24/17  Yes [provider]  Critical care time: 55 minutes     Levy Pupa, MD, PhD 07/22/2018, 12:13 PM [facility] [phone] or if no answer [phone]

## 2018-07-22 NOTE — Progress Notes (Signed)
A consult was received from an ED physician for vancomycin and cefepime per pharmacy dosing.  The patient's profile has been reviewed for ht/wt/allergies/indication/available labs.   A one time order has been placed for vancomycin 1g and cefepime 2g.    Further antibiotics/pharmacy consults should be ordered by admitting physician if indicated.                       Thank you, Loralee PacasErin Jenin Birdsall, PharmD, BCPS Pharmacy: [phone] 07/22/2018  3:22 AM

## 2018-07-22 NOTE — ED Triage Notes (Signed)
[facility]. Staff noted a temp of 102F with vomiting throughout the day. Pts oxygen was in the 80s on RA, EMS placed pt on [place] @ 2L with increase to 97%. Pt has alzheimer's/ dementia and is a poor historian.

## 2018-07-23 DIAGNOSIS — R001 Bradycardia, unspecified: Secondary | ICD-10-CM

## 2018-07-23 DIAGNOSIS — R410 Disorientation, unspecified: Secondary | ICD-10-CM

## 2018-07-23 DIAGNOSIS — I4891 Unspecified atrial fibrillation: Secondary | ICD-10-CM

## 2018-07-23 LAB — CBC
HCT: 28.6 % — ABNORMAL LOW (ref 36.0–46.0)
HEMOGLOBIN: 9.2 g/dL — AB (ref 12.0–15.0)
MCH: 27.4 pg (ref 26.0–34.0)
MCHC: 32.2 g/dL (ref 30.0–36.0)
MCV: 85.1 fL (ref 80.0–100.0)
NRBC: 0 % (ref 0.0–0.2)
Platelets: 112 10*3/uL — ABNORMAL LOW (ref 150–400)
RBC: 3.36 MIL/uL — AB (ref 3.87–5.11)
RDW: 13.9 % (ref 11.5–15.5)
WBC: 5.2 10*3/uL (ref 4.0–10.5)

## 2018-07-23 LAB — COMPREHENSIVE METABOLIC PANEL
ALBUMIN: 3.6 g/dL (ref 3.5–5.0)
ALK PHOS: 40 U/L (ref 38–126)
ALT: 30 U/L (ref 0–44)
AST: 36 U/L (ref 15–41)
Anion gap: 8 (ref 5–15)
BUN: 17 mg/dL (ref 8–23)
CALCIUM: 8.3 mg/dL — AB (ref 8.9–10.3)
CO2: 18 mmol/L — AB (ref 22–32)
CREATININE: 0.68 mg/dL (ref 0.44–1.00)
Chloride: 114 mmol/L — ABNORMAL HIGH (ref 98–111)
GFR calc Af Amer: >60 mL/min (ref >60)
GFR calc non Af Amer: >60 mL/min (ref >60)
Glucose, Bld: 146 mg/dL — ABNORMAL HIGH (ref 70–99)
Potassium: 3.4 mmol/L — ABNORMAL LOW (ref 3.5–5.1)
SODIUM: 140 mmol/L (ref 135–145)
Total Bilirubin: 0.5 mg/dL (ref 0.3–1.2)
Total Protein: 5.5 g/dL — ABNORMAL LOW (ref 6.5–8.1)

## 2018-07-23 LAB — PHOSPHORUS: Phosphorus: 1.6 mg/dL — ABNORMAL LOW (ref 2.5–4.6)

## 2018-07-23 LAB — SAMPLE TO BLOOD BANK

## 2018-07-23 LAB — CBC WITH DIFFERENTIAL/PLATELET
Abs Immature Granulocytes: 0 10*3/uL (ref 0.00–0.07)
BASOS ABS: 0 10*3/uL (ref 0.0–0.1)
Band Neutrophils: 5 %
Basophils Relative: 0 %
EOS ABS: 0 10*3/uL (ref 0.0–0.5)
Eosinophils Relative: 0 %
HEMATOCRIT: 27.9 % — AB (ref 36.0–46.0)
Hemoglobin: 8.9 g/dL — ABNORMAL LOW (ref 12.0–15.0)
Lymphocytes Relative: 22 %
Lymphs Abs: 1.2 10*3/uL (ref 0.7–4.0)
MCH: 28.1 pg (ref 26.0–34.0)
MCHC: 31.9 g/dL (ref 30.0–36.0)
MCV: 88 fL (ref 80.0–100.0)
Monocytes Absolute: 0.2 10*3/uL (ref 0.1–1.0)
Monocytes Relative: 3 %
NEUTROS ABS: 4 10*3/uL (ref 1.7–7.7)
NEUTROS PCT: 70 %
NRBC: 0 % (ref 0.0–0.2)
Platelets: 109 10*3/uL — ABNORMAL LOW (ref 150–400)
RBC: 3.17 MIL/uL — AB (ref 3.87–5.11)
RDW: 14 % (ref 11.5–15.5)
WBC: 5.3 10*3/uL (ref 4.0–10.5)

## 2018-07-23 LAB — GLUCOSE, CAPILLARY
GLUCOSE-CAPILLARY: 137 mg/dL — AB (ref 70–99)
GLUCOSE-CAPILLARY: 147 mg/dL — AB (ref 70–99)
GLUCOSE-CAPILLARY: 144 mg/dL — AB (ref 70–99)
Glucose-Capillary: 128 mg/dL — ABNORMAL HIGH (ref 70–99)
Glucose-Capillary: 194 mg/dL — ABNORMAL HIGH (ref 70–99)
Glucose-Capillary: 192 mg/dL — ABNORMAL HIGH (ref 70–99)
Glucose-Capillary: 133 mg/dL — ABNORMAL HIGH (ref 70–99)

## 2018-07-23 LAB — MAGNESIUM: Magnesium: 1.7 mg/dL (ref 1.7–2.4)

## 2018-07-23 MED ORDER — ASPIRIN EC 325 MG PO TBEC
325.0000 mg | DELAYED_RELEASE_TABLET | Freq: Every day | ORAL | Status: DC
  Administered 2018-07-23: 325 mg via ORAL
  Filled 2018-07-23: qty 1

## 2018-07-23 MED ORDER — HYDROCORTISONE NA SUCCINATE PF 100 MG IJ SOLR
25.0000 mg | Freq: Four times a day (QID) | INTRAMUSCULAR | Status: DC
  Administered 2018-07-23 – 2018-07-24 (×4): 25 mg via INTRAVENOUS
  Filled 2018-07-23 (×4): qty 2

## 2018-07-23 MED ORDER — LEVOFLOXACIN 500 MG PO TABS: 250.0000 mg | ORAL_TABLET | Freq: Every day | ORAL | Status: DC

## 2018-07-23 MED ORDER — MAGNESIUM SULFATE 2 GM/50ML IV SOLN
2.0000 g | Freq: Once | INTRAVENOUS | Status: AC
  Administered 2018-07-23: 2 g via INTRAVENOUS
  Filled 2018-07-23: qty 50

## 2018-07-23 MED ORDER — DEXTROSE 5 % IV SOLN
40.0000 meq | Freq: Once | INTRAVENOUS | Status: AC
  Administered 2018-07-23: 40 meq via INTRAVENOUS
  Filled 2018-07-23: qty 9.09

## 2018-07-23 NOTE — Consult Note (Signed)
Cardiology Consultation:   Patient ID: Toni Sullivan MRN: [id number]; DOB: 04/11/1951  Admit date: 07/22/2018 Date of Consult: 07/23/2018  Primary Care Provider: Excell SeltzerBedsole, Amy E, MD Primary Cardiologist: new to [facility] Primary Electrophysiologist:     Patient Profile:   Toni Sullivan is a 67 y.o. female with a hx of dementia , resident of SNF,  who is being seen today for the evaluation of  bradycarida  at the request of Dr .Sharl MaLama   History of Present Illness:   Toni Sullivan is a 67 year old female with a history of severe dementia.  She is resident of skilled nursing facility.  She has a very caring family.  She is had progressive dementia since 2007.  Urinary tract infection and sepsis syndrome.  She was originally thought to have atrial fibrillation.  Further review of the EKG reveals that this was just artifact and that she was in sinus rhythm.  She also has had some bradycardia. Patient does not appear to be in any distress.  She is not able to tell us if she is having any pain. There is no loss of consciousness.  No other history is available because of the patient's severe dementia.  Past Medical History:  Diagnosis Date  . Dementia (HCC)   . Headache(784.0)   . Memory loss   . Stroke [facility](HCC)     Past Surgical History:  Procedure Laterality Date  . MOUTH SURGERY    . SKIN CANCER EXCISION       Home Medications:  Prior to Admission medications   Medication Sig Start Date End Date Taking? Authorizing Provider  acetaminophen (TYLENOL) 500 MG tablet Take 500 mg by mouth every 4 (four) hours as needed (for pain level 1-8 or temperature greater than 101 degrees).    Yes [provider]  alum & mag hydroxide-simeth (MINTOX) 200-200-20 MG/5ML suspension Take 30 mLs by mouth as needed for indigestion or heartburn. Do not exceed 4 doses in 24 hours   Yes [provider]  Cranberry 450 MG TABS Take 450 mg by mouth daily.   Yes [provider]    divalproex (DEPAKOTE SPRINKLE) 125 MG capsule Take 250 mg by mouth 3 (three) times daily.   Yes [provider]  fluPHENAZine (PROLIXIN) 1 MG tablet Take 1 mg by mouth at bedtime. 08/01/17  Yes [provider]  guaifenesin (ROBITUSSIN) 100 MG/5ML syrup Take 200 mg by mouth every 6 (six) hours as needed for cough.   Yes [provider]  loperamide (IMODIUM) 2 MG capsule Take 2 mg by mouth as needed for diarrhea or loose stools. Do not exceed 8 doses in 24 hours   Yes [provider]  LORazepam (ATIVAN) 0.5 MG tablet Take 0.5 mg by mouth every 6 (six) hours.  05/22/17  Yes [provider]  magnesium hydroxide (MILK OF MAGNESIA) 400 MG/5ML suspension Take 30 mLs by mouth at bedtime as needed for mild constipation.   Yes [provider]  Melatonin 3 MG TABS Take 3 mg by mouth at bedtime.   Yes [provider]  mirtazapine (REMERON) 15 MG tablet Take 15 mg by mouth at bedtime.   Yes [provider]  Neomycin-Bacitracin-Polymyxin (TRIPLE ANTIBIOTIC) [phone] OINT Apply 1 application topically as needed (minor skin tears or abrasions).   Yes [provider]  Nutritional Supplements (NUTRITIONAL DRINK PO) Take 236 mLs by mouth 3 (three) times daily. MIGHTY SHAKES    Yes [provider]  PRESCRIPTION MEDICATION Apply 2  mg topically every 6 (six) hours as needed (severe agitation). Lorazepam 1 mg Gel   Yes [provider]  risperiDONE (RISPERDAL) 0.5 MG tablet Take 0.5 mg by mouth every 6 (six) hours as needed (severe agitation).  12/01/17  Yes [provider]  Rivastigmine (EXELON) 13.3 MG/24HR PT24 Apply 1 patch (13.3 mg total) topically daily. 09/01/16  Yes Nilda Riggs, NP  sertraline (ZOLOFT) 25 MG tablet Take 75 mg by mouth daily. 07/24/17  Yes [provider]    Inpatient Medications: Scheduled Meds: . aspirin EC  325 mg Oral Daily  . divalproex  250 mg Oral TID  . enoxaparin  (LOVENOX) injection  40 mg Subcutaneous Q24H  . hydrocortisone sod succinate (SOLU-CORTEF) inj  25 mg Intravenous Q6H  . mirtazapine  15 mg Oral QHS  . nitroGLYCERIN  1 inch Topical Q8H  . sertraline  75 mg Oral Daily   Continuous Infusions: . albumin human    . cefTRIAXone (ROCEPHIN)  IV Stopped (07/23/18 1229)  . famotidine (PEPCID) IV Stopped (07/23/18 1149)  . metronidazole Stopped (07/23/18 1203)  . sodium chloride     PRN Meds: acetaminophen, ondansetron (ZOFRAN) IV, risperiDONE  Allergies:    Allergies  Allergen Reactions  . Penicillins Other (See Comments)    Unknown reaction Has patient had a PCN reaction causing immediate rash, facial/tongue/throat swelling, SOB or lightheadedness with hypotension: Unknown Has patient had a PCN reaction causing severe rash involving mucus membranes or skin necrosis: Unknown Has patient had a PCN reaction that required hospitalization: Unknown Has patient had a PCN reaction occurring within the last 10 years: No If all of the above answers are "NO", then may proceed with Cephalosporin use.     Social History:   Social History   Socioeconomic History  . Marital status: Married    Spouse name: Earlene Plater  . Number of children: 2  . Years of education: 31  . Highest education level: Not on file  Occupational History  . Occupation: retired    Associate Professor: UNEMPLOYED  Social Needs  . Financial resource strain: Not on file  . Food insecurity:    Worry: Not on file    Inability: Not on file  . Transportation needs:    Medical: Not on file    Non-medical: Not on file  Tobacco Use  . Smoking status: Never Smoker  . Smokeless tobacco: Never Used  Substance and Sexual Activity  . Alcohol use: Yes    Comment: 1 glass daily  . Drug use: No  . Sexual activity: Never  Lifestyle  . Physical activity:    Days per week: Not on file    Minutes per session: Not on file  . Stress: Not on file  Relationships  . Social connections:     Talks on phone: Not on file    Gets together: Not on file    Attends religious service: Not on file    Active member of club or organization: Not on file    Attends meetings of clubs or organizations: Not on file    Relationship status: Not on file  . Intimate partner violence:    Fear of current or ex partner: Not on file    Emotionally abused: Not on file    Physically abused: Not on file    Forced sexual activity: Not on file  Other Topics Concern  . Not on file  Social History Narrative   [name] and lives with her husband.  Patient has two children.   Patient is retired.   Patient has a high school education.   Patient is right-handed.   Patient drinks 5-6 cups of coffee daily.       Family History:   History reviewed. No pertinent family history.   ROS:  Please see the history of present illness.   All other ROS reviewed and negative.     Physical Exam/Data:   Vitals:   07/23/18 0800 07/23/18 1000 07/23/18 1152 07/23/18 1200  BP: 96/68 100/62  92/71  Pulse: (!) 49 62  (!) 56  Resp: (!) 25 (!) 40  (!) 21  Temp: 98.1 F (36.7 C)  98.1 F (36.7 C)   TempSrc: Axillary  Axillary   SpO2: 96% 95%  94%  Weight:      Height:        Intake/Output Summary (Last 24 hours) at 07/23/2018 1344 Last data filed at 07/23/2018 1245 Gross per 24 hour  Intake 7207.7 ml  Output 1600 ml  Net 5607.7 ml   Filed Weights   07/22/18 0353  Weight: 65.8 kg   Body mass index is 22.71 kg/m.  General: Chronically ill-appearing female, no acute distress, able to speak words but cannot answer any questions in the words do not mean anything. HEENT: normal Lymph: no adenopathy Neck: no JVD Endocrine:  No thryomegaly Vascular: No carotid bruits; FA pulses 2+ bilaterally without bruits  Cardiac: Rate S1-S2. Lungs:  clear to auscultation bilaterally, no wheezing, rhonchi or rales  Abd: soft, nontender, no hepatomegaly  Ext: no edema Musculoskeletal:  No  deformities, BUE and BLE strength normal and equal Skin: warm and dry  Neuro:  CNs 2-12 intact, no focal abnormalities noted Psych:  Normal affect   EKG: Normal sinus rhythm. Telemetry: Sinus rhythm with episodes of sinus bradycardia.  Relevant CV Studies:   Laboratory Data:  Chemistry Recent Labs  Lab 07/22/18 0336 07/23/18 0401  NA 141 140  K 4.5 3.4*  CL 106 114*  CO2 24 18*  GLUCOSE 141* 146*  BUN 19 17  CREATININE 0.85 0.68  CALCIUM 8.9 8.3*  GFRNONAA >60 >60  GFRAA >60 >60  ANIONGAP 11 8    Recent Labs  Lab 07/22/18 0336 07/23/18 0401  PROT 6.8 5.5*  ALBUMIN 3.6 3.6  AST 36 36  ALT 17 30  ALKPHOS 78 40  BILITOT 0.5 0.5   Hematology Recent Labs  Lab 07/22/18 0336 07/23/18 0401 07/23/18 0630  WBC 8.6 5.3 5.2  RBC 4.82 3.17* 3.36*  HGB 13.1 8.9* 9.2*  HCT 40.9 27.9* 28.6*  MCV 84.9 88.0 85.1  MCH 27.2 28.1 27.4  MCHC 32.0 31.9 32.2  RDW 13.2 14.0 13.9  PLT 154 109* 112*   Cardiac EnzymesNo results for input(s): TROPONINI in the last 168 hours. No results for input(s): TROPIPOC in the last 168 hours.  BNP Recent Labs  Lab 07/22/18 0336  BNP 99.8    DDimer No results for input(s): DDIMER in the last 168 hours.  Radiology/Studies:  Dg Abd 1 View  Result Date: 07/22/2018 CLINICAL DATA:  Vomiting, dementia EXAM: ABDOMEN - 1 VIEW COMPARISON:  Portable exam at 0922 hrs without priors for comparison FINDINGS: Nonobstructive bowel gas pattern. No bowel dilatation or bowel wall thickening. Increased stool in rectum. Small calcification 8 x 4 mm projects over inferior pole of RIGHT kidney. Large calcification in RIGHT upper quadrant, projecting over mid RIGHT kidney, question 3.1 x 2.4 cm diameter calculus at RIGHT renal pelvis versus large  densely calcified gallstone. Scattered pelvic phleboliths. Bones demineralized with degenerative disc disease changes at L4-L5. IMPRESSION: RIGHT renal calculi versus less likely cholelithiasis. Increased stool in  rectum. Otherwise normal bowel gas pattern Electronically Signed   By: Ulyses Southward M.D.   On: 07/22/2018 10:10   Dg Chest Port 1 View  Result Date: 07/22/2018 CLINICAL DATA:  Fever.  Sepsis. EXAM: PORTABLE CHEST 1 VIEW COMPARISON:  None. FINDINGS: Patient is rotated. Heart size and pulmonary vascularity are normal. Probable emphysematous changes in the lungs. No airspace disease or consolidation. No blunting of costophrenic angles. No pneumothorax. Chin mediastinal contours appear intact. IMPRESSION: Emphysematous changes in the lungs. No evidence of active pulmonary disease. Electronically Signed   By: Burman Nieves M.D.   On: 07/22/2018 03:34    Assessment and Plan:   1. 1.  Sinus bradycardia: The patient is completely able hemodynamically.  She is not able to tell us if she is symptomatic but she does not appear to be in in any distress.  Is bedbound and is severely demented.  She is not a candidate for pacemaker implantation.  2.  Question of atrial fibrillation.  I have reviewed the EKGs.  EKGs showed normal sinus rhythm with artifact.  There is no evidence of atrial fibrillation.   [facility] will sign off.   Medication Recommendations:  Continue conservative therapy  Other recommendations (labs, testing, etc):   Follow up as an outpatient:  With her primary MD She does not need to see cardiology   For questions or updates, please contact [facility] Please consult [email or web address] for contact info under     Signed, Kristeen Miss, MD  07/23/2018 1:44 PM

## 2018-07-23 NOTE — Progress Notes (Signed)
eLink Physician-Brief Progress Note Patient Name: Letitia LibraBonnie G Bratz DOB: 02/20/1951 MRN: [id number]   Date of Service  07/23/2018  HPI/Events of Note  Low potassium, phos, and magnesium  eICU Interventions  replaced     Intervention Category Intermediate Interventions: Electrolyte abnormality - evaluation and management  Henry RusselSMITH, Rodrigus Kilker, P 07/23/2018, 5:27 AM

## 2018-07-23 NOTE — Care Management Note (Signed)
Case Management Note  Patient Details  Name: Toni Sullivan MRN: [id number] Date of Birth: 04/28/1951  Subjective/Objective:                  67 year old woman with Alzheimer's dementia, history of CVA, admitted with apparent urinary tract infection associated septic shock.  Partial response to IV fluids but continues to be hypotensive, now on norepinephrine infusion.   Action/Plan: [facility] Following for progression of care. Following for cm needs, none present at this time, no discharge plans at this time.  Expected Discharge Date:  07/25/18               Expected Discharge Plan:  Skilled Nursing Facility  In-House Referral:  Clinical Social Work  Discharge planning Services  CM Consult  Post Acute Care Choice:    Choice offered to:     DME Arranged:    DME Agency:     HH Arranged:    HH Agency:     Status of Service:  In process, will continue to follow  If discussed at Long Length of Stay Meetings, dates discussed:    Additional Comments:  [name], [name], RN 07/23/2018, 10:17 AM

## 2018-07-23 NOTE — Progress Notes (Signed)
[facility]  PROGRESS NOTE  MARYETTA SHAFER FAO:[id number] DOB: June 22, 1951 DOA: 07/22/2018 PCP: Excell Seltzer, MD   Brief HPI:   67 year old female with history of dementia with aggressive behavior, TIA, CVA with no focal deficits, depression/anxiety came to the [facility] ED from skilled facility with complaints of fever and vomiting.  Patient usually has baseline confusion and has been getting worse as per patient's husband.  She was agitated with altered mental status.  Upon arrival in the ED patient was found to have, temperature 1-2.4, she was hypotensive code sepsis activated.  Patient was put on vasopressors.  PCCM was consulted.  Patient has now been weaned off vasopressors and is transferred back to [facility].    Subjective   Patient seen and examined, continues to be pleasantly confused.  She is off norepinephrine infusion.   Assessment/Plan:     1. Sepsis due to UTI-sepsis physiology has resolved, urine culture has been obtained and result is currently pending.  Patient had abnormal UA on presentation.  Continue empiric ceftriaxone.  Will follow urine culture results.  Will wean off Solu-Cortef.  2. Hypotension-secondary to above, resolved.  Patient has been weaned off Levophed.  Will monitor.  3. New onset atrial fibrillation-patient had transient atrial fibrillation yesterday, currently resolved.  She is bradycardic at this time.CHA2DS2VASc score is 2.  Will hold off anticoagulation for now.  Started on aspirin.  4. Bradycardia-patient's heart rate dropped to 40s this morning, she is not on medications which can affect heart rate.  She is currently asymptomatic.  Will obtain cardiology consultation, echocardiogram obtained yesterday showed no significant abnormality, EF 65 to 70%.  5. Dementia-with baseline mood disorder, depression anxiety.  Continue Depakote, Remeron, Risperdal, Zoloft.  6. Prior history of CVA/TIA-continue aspirin.     CBG: Recent  Labs  Lab 07/22/18 1957 07/22/18 2349 07/23/18 0350 07/23/18 0842 07/23/18 1138  GLUCAP 168* 128* 137* 147* 194*    CBC: Recent Labs  Lab 07/22/18 0336 07/23/18 0401 07/23/18 0630  WBC 8.6 5.3 5.2  NEUTROABS 6.9 4.0  --   HGB 13.1 8.9* 9.2*  HCT 40.9 27.9* 28.6*  MCV 84.9 88.0 85.1  PLT 154 109* 112*    Basic Metabolic Panel: Recent Labs  Lab 07/22/18 0336 07/23/18 0401  NA 141 140  K 4.5 3.4*  CL 106 114*  CO2 24 18*  GLUCOSE 141* 146*  BUN 19 17  CREATININE 0.85 0.68  CALCIUM 8.9 8.3*  MG  --  1.7  PHOS  --  1.6*     DVT prophylaxis: Lovenox  Code Status: DNR  Family Communication: Discussed with patient's husband at bedside  Disposition Plan: Skilled nursing facility   Consultants:  PCCM  Procedures:  None   Antibiotics:   Anti-infectives (From admission, onward)   Start     Dose/Rate Route Frequency Ordered Stop   07/22/18 1200  cefTRIAXone (ROCEPHIN) 1 g in sodium chloride 0.9 % 100 mL IVPB     1 g 200 mL/hr over 30 Minutes Intravenous Every 24 hours 07/22/18 0719     07/22/18 0330  ceFEPIme (MAXIPIME) 2 g in sodium chloride 0.9 % 100 mL IVPB     2 g 200 mL/hr over 30 Minutes Intravenous  Once 07/22/18 0322 07/22/18 0425   07/22/18 0315  aztreonam (AZACTAM) 2 g in sodium chloride 0.9 % 100 mL IVPB  Status:  Discontinued     2 g 200 mL/hr over 30 Minutes Intravenous  Once 07/22/18 0311 07/22/18 0321  07/22/18 0315  metroNIDAZOLE (FLAGYL) IVPB 500 mg     500 mg 100 mL/hr over 60 Minutes Intravenous Every 8 hours 07/22/18 0311     07/22/18 0315  vancomycin (VANCOCIN) IVPB 1000 mg/200 mL premix     1,000 mg 200 mL/hr over 60 Minutes Intravenous  Once 07/22/18 0311 07/22/18 0522       Objective   Vitals:   07/23/18 0700 07/23/18 0800 07/23/18 1000 07/23/18 1152  BP: 103/69 96/68 100/62   Pulse: (!) 46 (!) 49 62   Resp: 19 (!) 25 (!) 40   Temp:  98.1 F (36.7 C)  98.1 F (36.7 C)  TempSrc:  Axillary  Axillary  SpO2: 97% 96%  95%   Weight:      Height:        Intake/Output Summary (Last 24 hours) at 07/23/2018 1224 Last data filed at 07/23/2018 1203 Gross per 24 hour  Intake 6626.87 ml  Output 1600 ml  Net 5026.87 ml   Filed Weights   07/22/18 0353  Weight: 65.8 kg     Physical Examination:    General: Appears pleasantly confused  Cardiovascular: S1-S2, regular, no murmurs auscultated  Respiratory: Clear to auscultation bilaterally, no wheezing or crackles auscultated  Abdomen: Soft, nontender, no organomegaly  Extremities: No edema in the lower extremities  Neurologic: Alert but not oriented x3, confused     Data Reviewed: I have personally reviewed following labs and imaging studies   Recent Results (from the past 240 hour(s))  Blood Culture (routine x 2)     Status: None (Preliminary result)   Collection Time: 07/22/18  3:36 AM  Result Value Ref Range Status   Specimen Description   Final    BLOOD LEFT WRIST Performed at [facility], [address], [place], [place] [address]    Special Requests   Final    BOTTLES DRAWN AEROBIC ONLY Blood Culture results may not be optimal due to an inadequate volume of blood received in culture bottles Performed at [facility], [address], [place], [place] [address]    Culture   Final    NO GROWTH < 24 HOURS Performed at [facility], [address] [place], [place] [address]    Report Status PENDING  Incomplete  Blood Culture (routine x 2)     Status: None (Preliminary result)   Collection Time: 07/22/18  6:54 AM  Result Value Ref Range Status   Specimen Description   Final    BLOOD LEFT HAND BOTTLES DRAWN AEROBIC ONLY Blood Culture adequate volume Performed at [facility], [address] [place], [place] [address]    Special Requests   Final    NONE Performed at [facility], [address], [place], [place] [address]    Culture   Final     NO GROWTH < 24 HOURS Performed at [facility], [address] [place], [place] [address]    Report Status PENDING  Incomplete  MRSA PCR Screening     Status: None   Collection Time: 07/22/18  7:22 AM  Result Value Ref Range Status   MRSA by PCR NEGATIVE NEGATIVE Final    Comment:        The GeneXpert MRSA Assay (FDA approved for NASAL specimens only), is one component of a comprehensive MRSA colonization surveillance program. It is not intended to diagnose MRSA infection nor to guide or monitor treatment for MRSA infections. Performed at  [facility], [address] [place], [place] [address]      Liver Function Tests: Recent Labs  Lab 07/22/18 0336 07/23/18 0401  AST 36 36  ALT 17 30  ALKPHOS 78 40  BILITOT 0.5 0.5  PROT 6.8 5.5*  ALBUMIN 3.6 3.6   Recent Labs  Lab 07/22/18 0336  LIPASE 30   No results for input(s): AMMONIA in the last 168 hours.  Cardiac Enzymes: No results for input(s): CKTOTAL, CKMB, CKMBINDEX, TROPONINI in the last 168 hours. BNP (last 3 results) Recent Labs    07/22/18 0336  BNP 99.8    ProBNP (last 3 results) No results for input(s): PROBNP in the last 8760 hours.    Studies: Dg Abd 1 View  Result Date: 07/22/2018 CLINICAL DATA:  Vomiting, dementia EXAM: ABDOMEN - 1 VIEW COMPARISON:  Portable exam at 0922 hrs without priors for comparison FINDINGS: Nonobstructive bowel gas pattern. No bowel dilatation or bowel wall thickening. Increased stool in rectum. Small calcification 8 x 4 mm projects over inferior pole of RIGHT kidney. Large calcification in RIGHT upper quadrant, projecting over mid RIGHT kidney, question 3.1 x 2.4 cm diameter calculus at RIGHT renal pelvis versus large densely calcified gallstone. Scattered pelvic phleboliths. Bones demineralized with degenerative disc disease changes at L4-L5. IMPRESSION: RIGHT renal calculi versus less likely cholelithiasis. Increased stool in rectum. Otherwise  normal bowel gas pattern Electronically Signed   By: Ulyses SouthwardMark  Boles M.D.   On: 07/22/2018 10:10   Dg Chest Port 1 View  Result Date: 07/22/2018 CLINICAL DATA:  Fever.  Sepsis. EXAM: PORTABLE CHEST 1 VIEW COMPARISON:  None. FINDINGS: Patient is rotated. Heart size and pulmonary vascularity are normal. Probable emphysematous changes in the lungs. No airspace disease or consolidation. No blunting of costophrenic angles. No pneumothorax. Chin mediastinal contours appear intact. IMPRESSION: Emphysematous changes in the lungs. No evidence of active pulmonary disease. Electronically Signed   By: Burman NievesWilliam  Stevens M.D.   On: 07/22/2018 03:34    Scheduled Meds: . aspirin EC  325 mg Oral Daily  . divalproex  250 mg Oral TID  . enoxaparin (LOVENOX) injection  40 mg Subcutaneous Q24H  . hydrocortisone sod succinate (SOLU-CORTEF) inj  50 mg Intravenous Q6H  . mirtazapine  15 mg Oral QHS  . nitroGLYCERIN  1 inch Topical Q8H  . sertraline  75 mg Oral Daily    Admission status- Inpatient: Based on patients clinical presentation and evaluation of above clinical data, I have made determination that patient meets Inpatient criteria at this time.  Patient is admitted with sepsis due to UTI, on IV ceftriaxone.  Also developed new onset A. fib, bradycardia.  Cardiology consulted.  Time spent: 25 min  Meredeth IdeGagan S Lama   [facility] Pager [phone]. If 7PM-7AM, please contact night-coverage at [email or web address], Office  [phone]  password [email or web address]  07/23/2018, 12:24 PM  LOS: 1 day

## 2018-07-23 NOTE — Progress Notes (Signed)
NAME:  Toni Sullivan, MRN:  [id number], DOB:  02/06/1951, LOS: 1 ADMISSION DATE:  07/22/2018, CONSULTATION DATE: 07/22/2018 REFERRING MD: Dr. Margo AyeHall, TRH, CHIEF COMPLAINT: Shock  Brief History   67 year old woman with Alzheimer's dementia, history of CVA, admitted with apparent urinary tract infection associated septic shock.  Partial response to IV fluids but continues to be hypotensive-was on pressors, now off pressors  History of present illness   67 year old woman with little past medical history of beyond history of TIA/stroke, long-standing Alzheimer's dementia with some associated aggressive behavior.  She was noted to have suppressed sensorium, nausea vomiting and a fever of 102 at her SNF.  She was brought for evaluation and found to be febrile, hypotensive.  Her urinalysis was consistent with a probable urinary tract infection.  Other labs are for the most part unremarkable.  Her EKG did show atrial fibrillation which is apparently new.  She received aggressive volume resuscitation, empiric antibiotics, has now completed 6 L IV fluid and is receiving some albumin.  Her lactic acid did clear with these interventions and she is now more interactive.  Her blood pressures remain low and she was started empirically on stress dose steroids, norepinephrine.  Her norepinephrine dose was as high as 40, has now been weaned off.  PCCM consulted to assist with her management  Past Medical History   Past Medical History:  Diagnosis Date  . Dementia (HCC)   . Headache(784.0)   . Memory loss   . Stroke [facility](HCC)      Significant Hospital Events   Admitted septic shock, UTI 11/24  Consults:  PCCM 11/24  Procedures:    Significant Diagnostic Tests:  Chest x-ray 11/24 >> no infiltrates noted Abdominal x-ray 11/24 >> normal bowel gas pattern, increased stool in the rectum, some calcification projecting over the inferior pole of the right kidney, large calcification in the right upper quadrant  over the mid right kidney, suspected renal calculus  Micro Data:  Urine 11/24 >>  Blood 11/24 >>   Antimicrobials:  Vancomycin x1 11/24 Cefepime x1  11/24 Ceftriaxone 11/24 Flagyl 11/24  Interim history/subjective:   She feels generally better Mumbling, not purposeful in conversation According to spouse-she does appear to be close to her baseline  Objective   Blood pressure 96/68, pulse (!) 49, temperature 98.1 F (36.7 C), temperature source Axillary, resp. rate (!) 25, height 5\' 7"  (1.702 m), weight 65.8 kg, SpO2 96 %.        Intake/Output Summary (Last 24 hours) at 07/23/2018 1050 Last data filed at 07/23/2018 [id number] Gross per 24 hour  Intake 7113.29 ml  Output 1200 ml  Net 5913.29 ml   Filed Weights   07/22/18 0353  Weight: 65.8 kg    Examination: General: Comfortable, mumbling HENT: Moist oral mucosa Lungs: Clear breath sounds bilaterally Cardiovascular: S1-S2 appreciated Abdomen: Soft, nontender, positive bowel sounds Extremities: No edema, no clubbing Neuro: Awake, will interact although some of her words are nonsensical, she was able to answer simple questions, she is not oriented, moves all extremities   Resolved Hospital Problem list   Hypotension resolved  Assessment & Plan:  Septic shock, presumed source urinary tract infection.   Improved Weaned off pressors  Improved  Acute on chronic encephalopathy, due to sepsis and UTI -Continue supportive measures  At risk acute renal failure in setting of sepsis Lactic acidosis, cleared -Follow electrolytes  Dementia with baseline mood disorder, depression and anxiety -Appears to be close to baseline  Prior history CVA/TIA On  aspirin   Best practice:  Diet: Orally as tolerated  DVT prophylaxis: Enoxaparin GI prophylaxis: Pepcid Mobility: Bedrest Code Status: DNR, pressors okay per discussions with the patient's husband at bedside Family Communication: Prognosis, goals for care discussed with  husband 11/25  Disposition: ICU  Labs   CBC: Recent Labs  Lab 07/22/18 0336 07/23/18 0401 07/23/18 0630  WBC 8.6 5.3 5.2  NEUTROABS 6.9 4.0  --   HGB 13.1 8.9* 9.2*  HCT 40.9 27.9* 28.6*  MCV 84.9 88.0 85.1  PLT 154 109* 112*    Basic Metabolic Panel: Recent Labs  Lab 07/22/18 0336 07/23/18 0401  NA 141 140  K 4.5 3.4*  CL 106 114*  CO2 24 18*  GLUCOSE 141* 146*  BUN 19 17  CREATININE 0.85 0.68  CALCIUM 8.9 8.3*  MG  --  1.7  PHOS  --  1.6*   GFR: Estimated Creatinine Clearance: 64.5 mL/min (by C-G formula based on SCr of 0.68 mg/dL). Recent Labs  Lab 07/22/18 0336 07/22/18 0342 07/22/18 0620 07/22/18 0725 07/22/18 0934 07/22/18 1210 07/23/18 0401 07/23/18 0630  PROCALCITON  --   --   --  5.91  --   --   --   --   WBC 8.6  --   --   --   --   --  5.3 5.2  LATICACIDVEN  --  3.97* 3.84*  --  1.7 1.9  --   --     Liver Function Tests: Recent Labs  Lab 07/22/18 0336 07/23/18 0401  AST 36 36  ALT 17 30  ALKPHOS 78 40  BILITOT 0.5 0.5  PROT 6.8 5.5*  ALBUMIN 3.6 3.6   Recent Labs  Lab 07/22/18 0336  LIPASE 30   No results for input(s): AMMONIA in the last 168 hours.  ABG No results found for: PHART, PCO2ART, PO2ART, HCO3, TCO2, ACIDBASEDEF, O2SAT   Coagulation Profile: No results for input(s): INR, PROTIME in the last 168 hours.  Cardiac Enzymes: No results for input(s): CKTOTAL, CKMB, CKMBINDEX, TROPONINI in the last 168 hours.  HbA1C: No results found for: HGBA1C  CBG: Recent Labs  Lab 07/22/18 1957 07/22/18 2349 07/23/18 0350 07/23/18 0842  GLUCAP 168* 128* 137* 147*    Review of Systems:   Patient denies pain, otherwise unable to obtain  Past Medical History  She,  has a past medical history of Dementia (HCC), Headache(784.0), Memory loss, and Stroke (HCC).   Surgical History    Past Surgical History:  Procedure Laterality Date  . MOUTH SURGERY    . SKIN CANCER EXCISION       Social History   reports that she has  never smoked. She has never used smokeless tobacco. She reports that she drinks alcohol. She reports that she does not use drugs.   Family History   Her family history is not on file.   Allergies Allergies  Allergen Reactions  . Penicillins Other (See Comments)    Unknown reaction Has patient had a PCN reaction causing immediate rash, facial/tongue/throat swelling, SOB or lightheadedness with hypotension: Unknown Has patient had a PCN reaction causing severe rash involving mucus membranes or skin necrosis: Unknown Has patient had a PCN reaction that required hospitalization: Unknown Has patient had a PCN reaction occurring within the last 10 years: No If all of the above answers are "NO", then may proceed with Cephalosporin use.      Home Medications  Prior to Admission medications   Medication Sig Start Date End  Date Taking? Authorizing Provider  acetaminophen (TYLENOL) 500 MG tablet Take 500 mg by mouth every 4 (four) hours as needed (for pain level 1-8 or temperature greater than 101 degrees).    Yes [provider]  alum & mag hydroxide-simeth (MINTOX) 200-200-20 MG/5ML suspension Take 30 mLs by mouth as needed for indigestion or heartburn. Do not exceed 4 doses in 24 hours   Yes [provider]  Cranberry 450 MG TABS Take 450 mg by mouth daily.   Yes [provider]  divalproex (DEPAKOTE SPRINKLE) 125 MG capsule Take 250 mg by mouth 3 (three) times daily.   Yes [provider]  fluPHENAZine (PROLIXIN) 1 MG tablet Take 1 mg by mouth at bedtime. 08/01/17  Yes [provider]  guaifenesin (ROBITUSSIN) 100 MG/5ML syrup Take 200 mg by mouth every 6 (six) hours as needed for cough.   Yes [provider]  loperamide (IMODIUM) 2 MG capsule Take 2 mg by mouth as needed for diarrhea or loose stools. Do not exceed 8 doses in 24 hours   Yes [provider]  LORazepam (ATIVAN) 0.5 MG tablet Take 0.5 mg by mouth every 6 (six) hours.   05/22/17  Yes [provider]  magnesium hydroxide (MILK OF MAGNESIA) 400 MG/5ML suspension Take 30 mLs by mouth at bedtime as needed for mild constipation.   Yes [provider]  Melatonin 3 MG TABS Take 3 mg by mouth at bedtime.   Yes [provider]  mirtazapine (REMERON) 15 MG tablet Take 15 mg by mouth at bedtime.   Yes [provider]  Neomycin-Bacitracin-Polymyxin (TRIPLE ANTIBIOTIC) [phone] OINT Apply 1 application topically as needed (minor skin tears or abrasions).   Yes [provider]  Nutritional Supplements (NUTRITIONAL DRINK PO) Take 236 mLs by mouth 3 (three) times daily. MIGHTY SHAKES    Yes [provider]  PRESCRIPTION MEDICATION Apply 2 mg topically every 6 (six) hours as needed (severe agitation). Lorazepam 1 mg Gel   Yes [provider]  risperiDONE (RISPERDAL) 0.5 MG tablet Take 0.5 mg by mouth every 6 (six) hours as needed (severe agitation).  12/01/17  Yes [provider]  Rivastigmine (EXELON) 13.3 MG/24HR PT24 Apply 1 patch (13.3 mg total) topically daily. 09/01/16  Yes Nilda Riggs, NP  sertraline (ZOLOFT) 25 MG tablet Take 75 mg by mouth daily. 07/24/17  Yes [provider]     Virl Diamond Cell: [phone]

## 2018-07-23 NOTE — Evaluation (Signed)
Clinical/Bedside Swallow Evaluation Patient Details  Name: Toni Sullivan MRN: [id number] Date of Birth: 08/20/1951  Today's Date: 07/23/2018 Time: SLP Start Time (ACUTE ONLY): 65780835 SLP Stop Time (ACUTE ONLY): 0934 SLP Time Calculation (min) (ACUTE ONLY): 59 min  Past Medical History:  Past Medical History:  Diagnosis Date  . Dementia (HCC)   . Headache(784.0)   . Memory loss   . Stroke [facility](HCC)    Past Surgical History:  Past Surgical History:  Procedure Laterality Date  . MOUTH SURGERY    . SKIN CANCER EXCISION     HPI:  67 yo pt with h/o dementia, TIA, CVA= resides at [facility] with [facility]MS, decreased sensorium with n/v and fevers.  Pt also with agitation - found to be septic due to UTI.   Pt imaging study showed emphysema, increased stool in rectum.  Swallow evaluation ordered.     Assessment / Plan / Recommendation Clinical Impression  Pt with mild oral dysphagia due to cognitive deficits.  Currently pt's swallow ability is functional for puree/thin diet.  She does demonstrate mildly prolonged mastication with minimal oral residuals with solids but rotary mastication noted.  Cough x2 noted during entire meal.  Spouse reports pt is maintaining her hydration and nutrition on puree/thin diet = therefore recommend puree/thin for institutionalized feeding and allow family to bring in solids for pt to consume prn.  Educated spouse to changes with swallowing involving pt's with dementia, including gustatory changes.  Spouse observed lovingly feeding his spouse and allowing time for her to swallow.  Recommend monitor to assure pt intake adequate on modified diet and refer to SLP and dietician if nutrition becomes compromised. Thanks for this consult.   SLP Visit Diagnosis: Dysphagia, oral phase (R13.11)    Aspiration Risk  Moderate aspiration risk    Diet Recommendation Dysphagia 1 (Puree);Thin liquid(family can provide pt solids)   Liquid Administration via:  Cup;Straw Medication Administration: Crushed with puree Supervision: Staff to assist with self feeding;Full supervision/cueing for compensatory strategies Compensations: Minimize environmental distractions;Slow rate;Small sips/bites    Other  Recommendations Oral Care Recommendations: Oral care QID   Follow up Recommendations None      Frequency and Duration   n/a         Prognosis   n/a     Swallow Study   General Date of Onset: 07/23/18 HPI: 67 yo pt with h/o dementia, TIA, CVA= [facility] with [facility], decreased sensorium with n/v and fevers.  Pt also with agitation - found to be septic due to UTI.   Pt imaging study showed emphysema, increased stool in rectum.  Swallow evaluation ordered.   Type of Study: Bedside Swallow Evaluation Previous Swallow Assessment: ? none in epic Diet Prior to this Study: Dysphagia 1 (puree);Thin liquids(on puree/thin diet at SNF) Temperature Spikes Noted: No Respiratory Status: Room air History of Recent Intubation: No Behavior/Cognition: Alert;Doesn't follow directions Oral Cavity Assessment: Other (comment)(difficult to view due to pt becoming slightly atigated) Oral Care Completed by SLP: No Oral Cavity - Dentition: Adequate natural dentition Vision: Impaired for self-feeding(spouse feeding pt) Self-Feeding Abilities: Total assist;Other (Comment)(spouse states pt can hold her own cup) Patient Positioning: Upright in bed Baseline Vocal Quality: Normal Volitional Cough: Cognitively unable to elicit Volitional Swallow: Unable to elicit    Oral/Motor/Sensory Function Overall Oral Motor/Sensory Function: Generalized oral weakness(? mild left facial asymmetry, pt leaning left, did not follow commands, able to seal lips on spoon)   Ice Chips  Ice chips: Not tested   Thin Liquid Thin Liquid: Impaired Presentation: Cup;Straw Pharyngeal  Phase Impairments: Cough - Immediate Other Comments: cough x1/10 boluses    Nectar Thick    dnt  Honey Thick   dnt  Puree Puree: Within functional limits Presentation: Spoon   Solid     Solid: Impaired Oral Phase Impairments: Impaired mastication Oral Phase Functional Implications: Oral residue;Prolonged oral transit Other Comments: minimal oral residuals, pt has functional rotary mastication ability, recommend puree/thin for institutionalized feeding and allow family to bring in solids for pt to consume prn      Toni Sullivan 07/23/2018,11:45 AM  Toni Burnet, MS [facility] SLP Acute Rehab Services Pager [phone] Office [phone]

## 2018-07-24 ENCOUNTER — Inpatient Hospital Stay (HOSPITAL_COMMUNITY): Payer: PPO

## 2018-07-24 DIAGNOSIS — R6520 Severe sepsis without septic shock: Secondary | ICD-10-CM

## 2018-07-24 DIAGNOSIS — J810 Acute pulmonary edema: Secondary | ICD-10-CM

## 2018-07-24 DIAGNOSIS — N3000 Acute cystitis without hematuria: Secondary | ICD-10-CM

## 2018-07-24 LAB — URINE CULTURE: Culture: <10000 — AB

## 2018-07-24 LAB — GLUCOSE, CAPILLARY
GLUCOSE-CAPILLARY: 129 mg/dL — AB (ref 70–99)
GLUCOSE-CAPILLARY: 111 mg/dL — AB (ref 70–99)
GLUCOSE-CAPILLARY: 96 mg/dL (ref 70–99)
Glucose-Capillary: 114 mg/dL — ABNORMAL HIGH (ref 70–99)
Glucose-Capillary: 100 mg/dL — ABNORMAL HIGH (ref 70–99)
Glucose-Capillary: 146 mg/dL — ABNORMAL HIGH (ref 70–99)

## 2018-07-24 LAB — BASIC METABOLIC PANEL
Anion gap: 7 (ref 5–15)
BUN: 22 mg/dL (ref 8–23)
CALCIUM: 8.5 mg/dL — AB (ref 8.9–10.3)
CHLORIDE: 118 mmol/L — AB (ref 98–111)
CO2: 19 mmol/L — AB (ref 22–32)
Creatinine, Ser: 0.67 mg/dL (ref 0.44–1.00)
GFR calc Af Amer: >60 mL/min (ref >60)
GFR calc non Af Amer: >60 mL/min (ref >60)
GLUCOSE: 126 mg/dL — AB (ref 70–99)
Potassium: 3.6 mmol/L (ref 3.5–5.1)
Sodium: 144 mmol/L (ref 135–145)

## 2018-07-24 MED ORDER — FUROSEMIDE 10 MG/ML IJ SOLN
40.0000 mg | Freq: Two times a day (BID) | INTRAMUSCULAR | Status: DC
  Administered 2018-07-24 – 2018-07-25 (×2): 40 mg via INTRAVENOUS
  Filled 2018-07-24 (×2): qty 4

## 2018-07-24 MED ORDER — FUROSEMIDE 10 MG/ML IJ SOLN
40.0000 mg | Freq: Once | INTRAMUSCULAR | Status: AC
  Administered 2018-07-24: 40 mg via INTRAVENOUS
  Filled 2018-07-24: qty 4

## 2018-07-24 MED ORDER — CEFDINIR 300 MG PO CAPS
300.0000 mg | ORAL_CAPSULE | Freq: Two times a day (BID) | ORAL | Status: DC
  Filled 2018-07-24 (×2): qty 1

## 2018-07-24 MED ORDER — LORAZEPAM 0.5 MG PO TABS: 0.5000 mg | ORAL_TABLET | Freq: Four times a day (QID) | ORAL | 0 refills | Status: DC | PRN

## 2018-07-24 MED ORDER — CEFDINIR 125 MG/5ML PO SUSR
300.0000 mg | Freq: Two times a day (BID) | ORAL | Status: AC
  Administered 2018-07-24 – 2018-07-28 (×9): 300 mg via ORAL
  Filled 2018-07-24 (×10): qty 15

## 2018-07-24 MED ORDER — CEFDINIR 300 MG PO CAPS: 300.0000 mg | ORAL_CAPSULE | Freq: Two times a day (BID) | ORAL | 0 refills | Status: DC

## 2018-07-24 MED ORDER — HYDROCORTISONE NA SUCCINATE PF 100 MG IJ SOLR
25.0000 mg | Freq: Two times a day (BID) | INTRAMUSCULAR | Status: DC
  Administered 2018-07-24 – 2018-07-25 (×2): 25 mg via INTRAVENOUS
  Filled 2018-07-24 (×2): qty 2

## 2018-07-24 MED ORDER — FUROSEMIDE 10 MG/ML IJ SOLN: 40.0000 mg | Freq: Two times a day (BID) | INTRAMUSCULAR | Status: DC

## 2018-07-24 NOTE — [place] FL2 (Signed)
[place] MEDICAID FL2 LEVEL OF CARE SCREENING TOOL     IDENTIFICATION  Patient Name: Toni Sullivan Birthdate: 1951-01-05 Sex: female Admission Date (Current Location): 07/22/2018  [facility] and [place] Number:  Producer, television/film/video and Address:  [facility],  [address] [place] [address]      Provider Number: [id number]  Attending Physician Name and Address:  Meredeth Ide, MD  Relative Name and Phone Number:       Current Level of Care: Hospital Recommended Level of Care: Memory Care Prior Approval Number:    Date Approved/Denied:   PASRR Number:    Discharge Plan: Other (Comment)(Memorycare)    Current Diagnoses: Patient Active Problem List   Diagnosis Date Noted  . Sepsis secondary to UTI (HCC) 07/22/2018  . UTI (urinary tract infection) 06/12/2017  . Delirium 06/12/2017  . Aggressive behavior   . Urinary tract infection without hematuria   . Acute metabolic encephalopathy 02/03/2017  . Acute lower UTI 02/03/2017  . 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 History of CVA (cerebrovascular accident) 07/03/2013    Orientation RESPIRATION BLADDER Height & Weight     Self  Normal Continent Weight: 145 lb (65.8 kg) Height:  5\' 7"  (170.2 cm)  BEHAVIORAL SYMPTOMS/MOOD NEUROLOGICAL BOWEL NUTRITION STATUS      Continent Diet(No table salt)  AMBULATORY STATUS COMMUNICATION OF NEEDS Skin     Verbally Normal                       Personal Care Assistance Level of Assistance  Bathing, Feeding, Dressing Bathing Assistance: Limited assistance Feeding assistance: Limited assistance Dressing Assistance: Limited assistance     Functional Limitations Info  Sight, Hearing, Speech Sight Info: Adequate Hearing Info: Adequate Speech Info: Adequate    SPECIAL CARE FACTORS FREQUENCY                       Contractures Contractures Info: Not present    Additional Factors Info   Code Status, Allergies, Psychotropic Code Status Info: DNR Allergies Info: Allergies: Penicillins Psychotropic Info: depakote sprinkle,remeron, zoloft          Discharge Medications: Please see discharge summary for a list of discharge medications. Allergies as of 07/24/2018      Reactions   Penicillins Other (See Comments)   Has patient had a PCN reaction causing immediate rash, facial/tongue/throat swelling, SOB or lightheadedness with hypotension: Unkown PCN reaction causing severe rash involving mucus membranes or skin necrosis: Unknown PCN reaction that required hospitalization: Unknown PCN reaction occurring within the last 10 years: No If all of the above answers are "NO", then may proceed with Cephalosporin use. Tolerated Cefepime, Ceftriaxone (06/2018)         Medication List    TAKE these medications   acetaminophen 500 MG tablet Commonly known as:  TYLENOL Take 500 mg by mouth every 4 (four) hours as needed (for pain level 1-8 or temperature greater than 101 degrees).   cefdinir 300 MG capsule Commonly known as:  OMNICEF Take 1 capsule (300 mg total) by mouth every 12 (twelve) hours for 5 days.   Cranberry 450 MG Tabs Take 450 mg by mouth daily.   divalproex 125 MG capsule Commonly known as:  DEPAKOTE SPRINKLE Take 250 mg by mouth 3 (three) times daily.   fluPHENAZine 1 MG tablet Commonly known as:  PROLIXIN Take 1 mg by mouth at bedtime.  guaifenesin 100 MG/5ML syrup Commonly known as:  ROBITUSSIN Take 200 mg by mouth every 6 (six) hours as needed for cough.   loperamide 2 MG capsule Commonly known as:  IMODIUM Take 2 mg by mouth as needed for diarrhea or loose stools. Do not exceed 8 doses in 24 hours   LORazepam 0.5 MG tablet Commonly known as:  ATIVAN Take 1 tablet (0.5 mg total) by mouth every 6 (six) hours as needed for anxiety. What changed:    when to take this  reasons to take this   magnesium hydroxide 400 MG/5ML  suspension Commonly known as:  MILK OF MAGNESIA Take 30 mLs by mouth at bedtime as needed for mild constipation.   Melatonin 3 MG Tabs Take 3 mg by mouth at bedtime.   MINTOX 200-200-20 MG/5ML suspension Generic drug:  alum & mag hydroxide-simeth Take 30 mLs by mouth as needed for indigestion or heartburn. Do not exceed 4 doses in 24 hours   mirtazapine 15 MG tablet Commonly known as:  REMERON Take 15 mg by mouth at bedtime.   NUTRITIONAL DRINK PO Take 236 mLs by mouth 3 (three) times daily. MIGHTY SHAKES   PRESCRIPTION MEDICATION Apply 2 mg topically every 6 (six) hours as needed (severe agitation). Lorazepam 1 mg Gel   risperiDONE 0.5 MG tablet Commonly known as:  RISPERDAL Take 0.5 mg by mouth every 6 (six) hours as needed (severe agitation).   rivastigmine 13.3 MG/24HR Commonly known as:  EXELON Apply 1 patch (13.3 mg total) topically daily.   sertraline 25 MG tablet Commonly known as:  ZOLOFT Take 75 mg by mouth daily.   TRIPLE ANTIBIOTIC [phone] Oint Apply 1 application topically as needed (minor skin tears or abrasions).     Relevant Imaging Results:  Relevant Lab Results:   Additional Information [id number];  Clearance CootsNicole A Bhavik Cabiness, LCSW

## 2018-07-24 NOTE — Progress Notes (Signed)
CSW assisting with [facility].  CSW reached out to facility staff, waiting on a return call.  FL2 and D/C summary must be reviewed before the patient can return.  CSW notified patient spouse.   Vivi BarrackNicole Primitivo Merkey, Alexander MtLCSW, MSW Clinical Social Worker  [phone] 07/24/2018  10:42 AM

## 2018-07-24 NOTE — Progress Notes (Signed)
[facility]  PROGRESS NOTE  Toni Sullivan [name] DOB: 09/23/1950 DOA: 07/22/2018 PCP: Excell SeltzerBedsole, Amy E, MD   Brief HPI:   67 year old female with history of dementia with aggressive behavior, TIA, CVA with no focal deficits, depression/anxiety [facility] facility with complaints of fever and vomiting.  Patient usually has baseline confusion and has been getting worse as per patient's husband.  She was agitated with altered mental status.  Upon arrival in the ED patient was found to have, temperature 1-2.4, she was hypotensive code sepsis activated.  Patient was put on vasopressors.  PCCM was consulted.  Patient has now been weaned off vasopressors and is transferred back to [facility].    Subjective   Patient seen and examined, earlier plan was to discharge: Patient was found lethargic with increased rhonchi bilaterally.  Chest x-ray showed pulmonary edema.  She was given 1 dose of Lasix 40 mg IV.   Assessment/Plan:     1. Sepsis due to UTI-sepsis physiology has resolved, urine culture has been obtained and result is currently pending.  Patient had abnormal UA on presentation.  Continue empiric ceftriaxone.  Will follow urine culture results.  Will wean off Solu-Cortef.  Change to Solu-Cortef 25 mg every 12 hours.  2. Pulmonary edema-chest x-ray shows bibasilar pulmonary edema, start Lasix 40 mg every 12 hours.  Strict intake and output.  Check BMP in a.m.  3. Hypotension-secondary to above, resolved.  Patient has been weaned off Levophed.  Will monitor.  4. ? New onset atrial fibrillation versus artifact-patient had transient atrial fibrillation, cardiology was consulted and they reviewed the EKG patient does not have A. fib as per cardiology.    5. Bradycardia-patient's heart rate dropped to 40s this morning, she is not on medications which can affect heart rate.  She is currently asymptomatic.  She is not a candidate for pacemaker  insertion as per cardiology.  Echocardiogram obtained yesterday showed no significant abnormality, EF 65 to 70%.  6. Dementia-with baseline mood disorder, depression anxiety.  Continue Depakote, Remeron, Risperdal, Zoloft.  7. Prior history of CVA/TIA-stable     CBG: Recent Labs  Lab 07/23/18 1934 07/23/18 2325 07/24/18 0410 07/24/18 0756 07/24/18 1140  GLUCAP 192* 133* 114* 100* 129*    CBC: Recent Labs  Lab 07/22/18 0336 07/23/18 0401 07/23/18 0630  WBC 8.6 5.3 5.2  NEUTROABS 6.9 4.0  --   HGB 13.1 8.9* 9.2*  HCT 40.9 27.9* 28.6*  MCV 84.9 88.0 85.1  PLT 154 109* 112*    Basic Metabolic Panel: Recent Labs  Lab 07/22/18 0336 07/23/18 0401 07/24/18 0319  NA 141 140 144  K 4.5 3.4* 3.6  CL 106 114* 118*  CO2 24 18* 19*  GLUCOSE 141* 146* 126*  BUN 19 17 22   CREATININE 0.85 0.68 0.67  CALCIUM 8.9 8.3* 8.5*  MG  --  1.7  --   PHOS  --  1.6*  --      DVT prophylaxis: Lovenox  Code Status: DNR  Family Communication: Discussed with patient's husband at bedside  Disposition Plan: Skilled nursing facility   Consultants:  PCCM  Procedures:  None   Antibiotics:   Anti-infectives (From admission, onward)   Start     Dose/Rate Route Frequency Ordered Stop   07/24/18 1000  levofloxacin (LEVAQUIN) tablet 250 mg  Status:  Discontinued     250 mg Oral Daily 07/23/18 1450 07/24/18 0823   07/24/18 1000  cefdinir (OMNICEF) capsule 300  mg     300 mg Oral Every 12 hours 07/24/18 0823 07/29/18 0959   07/24/18 0000  cefdinir (OMNICEF) 300 MG capsule  Status:  Discontinued     300 mg Oral Every 12 hours 07/24/18 0834 07/24/18    07/24/18 0000  cefdinir (OMNICEF) 300 MG capsule     300 mg Oral Every 12 hours 07/24/18 1220 07/29/18 2359   07/22/18 1200  cefTRIAXone (ROCEPHIN) 1 g in sodium chloride 0.9 % 100 mL IVPB  Status:  Discontinued     1 g 200 mL/hr over 30 Minutes Intravenous Every 24 hours 07/22/18 0719 07/23/18 1450   07/22/18 0330  ceFEPIme  (MAXIPIME) 2 g in sodium chloride 0.9 % 100 mL IVPB     2 g 200 mL/hr over 30 Minutes Intravenous  Once 07/22/18 0322 07/22/18 0425   07/22/18 0315  aztreonam (AZACTAM) 2 g in sodium chloride 0.9 % 100 mL IVPB  Status:  Discontinued     2 g 200 mL/hr over 30 Minutes Intravenous  Once 07/22/18 0311 07/22/18 0321   07/22/18 0315  metroNIDAZOLE (FLAGYL) IVPB 500 mg  Status:  Discontinued     500 mg 100 mL/hr over 60 Minutes Intravenous Every 8 hours 07/22/18 0311 07/23/18 1422   07/22/18 0315  vancomycin (VANCOCIN) IVPB 1000 mg/200 mL premix     1,000 mg 200 mL/hr over 60 Minutes Intravenous  Once 07/22/18 0311 07/22/18 0522       Objective   Vitals:   07/24/18 0549 07/24/18 0700 07/24/18 0800 07/24/18 1200  BP: 107/61  120/83 119/78  Pulse: (!) 55 (!) 56 (!) 54 (!) 53  Resp: (!) 21 (!) 33 (!) 34 (!) 24  Temp:   98.5 F (36.9 C) 97.9 F (36.6 C)  TempSrc:   Axillary Axillary  SpO2: 90% (!) 89% 94% 91%  Weight:      Height:        Intake/Output Summary (Last 24 hours) at 07/24/2018 1547 Last data filed at 07/24/2018 1118 Gross per 24 hour  Intake 54.3 ml  Output 1100 ml  Net -1045.7 ml   Filed Weights   07/22/18 0353  Weight: 65.8 kg     Physical Examination:      General: Appears lethargic, confused at baseline  Cardiovascular: S1-S2, regular, no murmur auscultated  Respiratory: Bilateral rhonchi auscultated  Abdomen: Soft, nontender, no organomegaly  Musculoskeletal: No edema of the lower extremities     Data Reviewed: I have personally reviewed following labs and imaging studies   Recent Results (from the past 240 hour(s))  Blood Culture (routine x 2)     Status: None (Preliminary result)   Collection Time: 07/22/18  3:36 AM  Result Value Ref Range Status   Specimen Description   Final    BLOOD LEFT WRIST Performed at [facility], [address] [place], [place] [address]    Special Requests   Final    BOTTLES DRAWN  AEROBIC ONLY Blood Culture results may not be optimal due to an inadequate volume of blood received in culture bottles Performed at [facility], [address] [place], [place] [address]    Culture   Final    NO GROWTH 2 DAYS Performed at [facility], [address] [place], [place] [address]    Report Status PENDING  Incomplete  Blood Culture (routine x 2)     Status: None (Preliminary result)   Collection Time: 07/22/18  6:54 AM  Result  Value Ref Range Status   Specimen Description   Final    BLOOD LEFT HAND BOTTLES DRAWN AEROBIC ONLY Blood Culture adequate volume Performed at [facility], [address] [place], [place] [address]    Special Requests   Final    NONE Performed at [facility], [address] [place], [place] [address]    Culture   Final    NO GROWTH 2 DAYS Performed at [facility], [address], [place], [place] [address]    Report Status PENDING  Incomplete  Urine culture     Status: Abnormal   Collection Time: 07/22/18  6:54 AM  Result Value Ref Range Status   Specimen Description   Final    URINE, RANDOM Performed at [facility], [address] [place], [place] [address]    Special Requests   Final    NONE Performed at [facility], [address] [place], [place] [address]    Culture (A)  Final    <10,000 COLONIES/mL INSIGNIFICANT GROWTH Performed at [facility], [address], [place], [place] [address]    Report Status 07/24/2018 FINAL  Final  MRSA PCR Screening     Status: None   Collection Time: 07/22/18  7:22 AM  Result Value Ref Range Status   MRSA by PCR NEGATIVE NEGATIVE Final    Comment:        The GeneXpert MRSA Assay (FDA approved for NASAL specimens only), is one component of a comprehensive MRSA colonization surveillance program. It is not intended to diagnose MRSA infection nor to guide or monitor  treatment for MRSA infections. Performed at [facility], [address] [place], [place] [address]      Liver Function Tests: Recent Labs  Lab 07/22/18 0336 07/23/18 0401  AST 36 36  ALT 17 30  ALKPHOS 78 40  BILITOT 0.5 0.5  PROT 6.8 5.5*  ALBUMIN 3.6 3.6   Recent Labs  Lab 07/22/18 0336  LIPASE 30   No results for input(s): AMMONIA in the last 168 hours.  Cardiac Enzymes: No results for input(s): CKTOTAL, CKMB, CKMBINDEX, TROPONINI in the last 168 hours. BNP (last 3 results) Recent Labs    07/22/18 0336  BNP 99.8    ProBNP (last 3 results) No results for input(s): PROBNP in the last 8760 hours.    Studies: Dg Chest Port 1 View  Result Date: 07/24/2018 CLINICAL DATA:  Dyspnea. EXAM: PORTABLE CHEST 1 VIEW COMPARISON:  Radiograph of July 22, 2018. FINDINGS: Stable cardiomediastinal silhouette. No pneumothorax is noted. New bibasilar opacities are noted concerning for edema or atelectasis with associated pleural effusions. Bony thorax is unremarkable. IMPRESSION: New bibasilar edema or atelectasis is noted with associated pleural effusions. Electronically Signed   By: Lupita Raider, M.D.   On: 07/24/2018 14:23    Scheduled Meds: . cefdinir  300 mg Oral Q12H  . divalproex  250 mg Oral TID  . enoxaparin (LOVENOX) injection  40 mg Subcutaneous Q24H  . furosemide  40 mg Intravenous Q12H  . hydrocortisone sod succinate (SOLU-CORTEF) inj  25 mg Intravenous Q12H  . mirtazapine  15 mg Oral QHS  . sertraline  75 mg Oral Daily    Admission status- Inpatient: Based on patients clinical presentation and evaluation of above clinical data, I have made determination that patient meets Inpatient criteria at this time.  Patient is admitted with sepsis due to UTI, on IV ceftriaxone.  Now has pulmonary edema requiring IV Lasix. Time spent: 25 min  Meredeth Ide   [facility] Pager [phone]. If 7PM-7AM, please contact night-coverage at  [email or web address], Office  [phone]  password [email or web address]  07/24/2018, 3:47 PM  LOS: 2 days

## 2018-07-24 NOTE — Progress Notes (Signed)
Attempted to sit patient on side of bed. Patient very weak and unable to do this. Dr. Sharl MaLama notifed ,discharge canceled. Lungs with rhonchi upper lobes. O2 sat 89. O2 2l applied .Lasix 40mg m ordered

## 2018-07-24 NOTE — Evaluation (Signed)
Physical Therapy Evaluation Patient Details Name: Toni Sullivan MRN: [id number] DOB: 07-16-1951 Today's Date: 07/24/2018   History of Present Illness  67 year old female with history of dementia with aggressive behavior, TIA, CVA with no focal deficits, depression/anxiety admitted from skilled facility with complaints of fever and vomiting.   Clinical Impression  Patient presents with decreased independence with mobility due to weakness and deconditioning from acute illness.  She will benefit from skilled PT in the acute setting and follow up PT in facility to maximize mobility/safety as previously an "independent" ambulator with history of falls.  Currently mod A of 2 for safety with ambulation.  Feel safest plan for return to facility is ambulance transport and spoke with spouse.  Will follow for acute PT until d/c.     Follow Up Recommendations Supervision/Assistance - 24 hour;Other (comment)(return to memory care with follow up PT)    Equipment Recommendations  None recommended by PT    Recommendations for Other Services       Precautions / Restrictions Precautions Precautions: Fall      Mobility  Bed Mobility Overal bed mobility: Needs Assistance Bed Mobility: Supine to Sit     Supine to sit: Mod assist;HOB elevated     General bed mobility comments: pt moving towards EOB naturally, assist to scoot on pad and lift trunk  Transfers Overall transfer level: Needs assistance Equipment used: 2 person hand held assist Transfers: Sit to/from Stand Sit to Stand: Mod assist;+2 physical assistance         General transfer comment: lifting assist with pt with posterior lean initially; sit<>stand x 3 for hygiene x 2; to sit assist to lower and guide patient for safety/secure feeling with less fear  Ambulation/Gait Ambulation/Gait assistance: Min assist;Mod assist;+2 physical assistance Gait Distance (Feet): 80 Feet Assistive device: 2 person hand held assist Gait  Pattern/deviations: Step-to pattern;Step-through pattern;Decreased stride length;Shuffle;Decreased dorsiflexion - left;Decreased dorsiflexion - right;Narrow base of support     General Gait Details: incontinent of urine in hallway so returned to room  Stairs            Wheelchair Mobility    Modified Rankin (Stroke Patients Only)       Balance Overall balance assessment: Needs assistance Sitting-balance support: Feet supported;Feet unsupported Sitting balance-Leahy Scale: Poor Sitting balance - Comments: min A for safety at EOB Postural control: Posterior lean Standing balance support: Bilateral upper extremity supported Standing balance-Leahy Scale: Poor Standing balance comment: initially with posterior lean                             Pertinent Vitals/Pain Faces Pain Scale: No hurt    Home Living Family/patient expects to be discharged to:: Other (Comment)(Memory Care)                      Prior Function Level of Independence: Needs assistance   Gait / Transfers Assistance Needed: has fallen in facility, but does not use device  ADL's / Homemaking Assistance Needed: assist for all ADL's        Hand Dominance        Extremity/Trunk Assessment   Upper Extremity Assessment Upper Extremity Assessment: Generalized weakness    Lower Extremity Assessment Lower Extremity Assessment: Generalized weakness    Cervical / Trunk Assessment Cervical / Trunk Assessment: Kyphotic;Other exceptions Cervical / Trunk Exceptions: stiffness, at times with extensor tone in head neck and legs  Communication   Communication: Expressive  difficulties  Cognition Arousal/Alertness: Awake/alert Behavior During Therapy: Impulsive;Anxious Overall Cognitive Status: History of cognitive impairments - at baseline                                        General Comments General comments (skin integrity, edema, etc.): spouse present and helping  with mobility for patient.  Discussed method for assisting to sit as reports recently patient began having difficulty finding chair.  Also discussed decreased safety for riding in the car right now due to illness/weakness and decreased balance and recommended medical transport back to facility.      Exercises     Assessment/Plan    PT Assessment Patient needs continued PT services  PT Problem List Decreased strength;Decreased mobility;Decreased balance;Decreased knowledge of use of DME;Decreased safety awareness;Decreased knowledge of precautions;Decreased activity tolerance       PT Treatment Interventions Therapeutic activities;Therapeutic exercise;Patient/family education;Gait training;Balance training;Functional mobility training    PT Goals (Current goals can be found in the Care Plan section)  Acute Rehab PT Goals Patient Stated Goal: to return to memory care PT Goal Formulation: With family Time For Goal Achievement: 08/03/18 Potential to Achieve Goals: Fair    Frequency Min 2X/week   Barriers to discharge        Co-evaluation               AM-PAC PT "6 Clicks" Mobility  Outcome Measure Help needed turning from your back to your side while in a flat bed without using bedrails?: A Lot Help needed moving from lying on your back to sitting on the side of a flat bed without using bedrails?: A Lot Help needed moving to and from a bed to a chair (including a wheelchair)?: A Lot Help needed standing up from a chair using your arms (e.g., wheelchair or bedside chair)?: A Lot Help needed to walk in hospital room?: A Lot Help needed climbing 3-5 steps with a railing? : Total 6 Click Score: 11    End of Session   Activity Tolerance: Patient tolerated treatment well Patient left: with call bell/phone within reach;in chair;with family/visitor present;with restraints reapplied   PT Visit Diagnosis: Other abnormalities of gait and mobility (R26.89);Muscle weakness  (generalized) (M62.81)    Time: [id number] PT Time Calculation (min) (ACUTE ONLY): 31 min   Charges:   PT Evaluation $PT Eval Moderate Complexity: 1 Mod PT Treatments $Gait Training: 8-22 mins $Therapeutic Activity: 8-22 mins        Toni LawlessCyndi Nishat Sullivan, [place] CarolinaPT Acute Rehabilitation Services [phone] 07/24/2018   Toni Sullivan 07/24/2018, 4:42 PM

## 2018-07-24 NOTE — Discharge Summary (Addendum)
Physician Discharge Summary  Toni Sullivan ZOX:[id number] DOB: [birth year] DOA: 07/22/2018  PCP: Excell Seltzer, MD  Admit date: 07/22/2018 Discharge date: 07/26/2018  Time spent: 40 minutes  Recommendations for Outpatient Follow-up:  1. 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  Patient usually has baseline confusion and has been getting worse as per patient's husband.  She was agitated with altered mental status.  Upon arrival in the ED patient was found to have, temperature 1-2.4, she was hypotensive code sepsis activated.  Patient was put on vasopressors.  PCCM was consulted.  Patient has now been weaned off vasopressors and is [facility].  Hospital Course:   1. Sepsis due to UTI-sepsis physiology has resolved, urine culture has been obtained and resulted as insignificant growth.  Patient had abnormal UA on presentation.  She was started on  empiric ceftriaxone.  Will discharge on Omnicef 300 mg po bid for 5 more days. Will discontinue Solucortef  2. Hypotension-secondary to above, resolved.  Patient has been weaned off Levophed.   3. ? New onset atrial fibrillation vs Artifact -patient had transient atrial fibrillation yesterday, currently resolved.  She is bradycardic at this time.CHA2DS2VASc score is 2.  Cardiology was consulted, and they reviewed EKG, she does not have A fib  4. Bradycardia-patient's heart rate dropped to 40s this morning, she is not on medications  which can affect heart rate. Resolved. Not a candidate for pacemaker.Echocardiogram obtained yesterday showed no significant abnormality, EF 65 to 70%.  5. Dementia-with baseline mood disorder, depression anxiety.  Continue Depakote, Remeron, Risperdal, Zoloft.  6. Prior history of CVA/TIA- stable  7. Pulmonary edema- patient was found to have pulmonary edema, required IV lasix. She is breathing better, not requiring oxygen. Lasix has been discontinued. Will discharge on lasix 20 mg daily.   Procedures:  None   Consultations:  Cardiology   Discharge Exam: Vitals:   07/26/18 0600 07/26/18 0800  BP: (!) 131/99 (!) 147/74  Pulse: (!) 52 (!) 55  Resp: 18 12  Temp:  98.3 F (36.8 C)  SpO2: 97% 99%    General: Appears in no acute distress Cardiovascular: S1S2 RRR Respiratory: Clear bilaterally  Discharge Instructions   Discharge Instructions    Diet - low sodium heart healthy   Complete by:  As directed    Increase activity slowly   Complete by:  As directed      Allergies as of 07/26/2018      Reactions   Penicillins Other (See Comments)   Has patient had a PCN reaction causing immediate rash, facial/tongue/throat swelling, SOB or lightheadedness with hypotension: Unkown PCN reaction causing severe rash involving mucus membranes or skin necrosis: Unknown PCN reaction that required hospitalization: Unknown PCN reaction occurring within the last 10 years: No If all of the above answers are "NO", then may proceed with Cephalosporin use. Tolerated Cefepime, Ceftriaxone (06/2018)      Medication List    TAKE these medications   acetaminophen 500 MG tablet  Commonly known as:  TYLENOL Take 500 mg by mouth every 4 (four) hours as needed (for pain level 1-8 or temperature greater than 101 degrees).   cefdinir 300 MG capsule Commonly known as:  OMNICEF Take 1 capsule (300 mg total) by mouth every 12 (twelve) hours for 5 days.   Cranberry 450 MG Tabs Take 450 mg by  mouth daily.   divalproex 125 MG capsule Commonly known as:  DEPAKOTE SPRINKLE Take 250 mg by mouth 3 (three) times daily.   fluPHENAZine 1 MG tablet Commonly known as:  PROLIXIN Take 1 mg by mouth at bedtime.   furosemide 20 MG tablet Commonly known as:  LASIX Take 1 tablet (20 mg total) by mouth daily.   guaifenesin 100 MG/5ML syrup Commonly known as:  ROBITUSSIN Take 200 mg by mouth every 6 (six) hours as needed for cough.   loperamide 2 MG capsule Commonly known as:  IMODIUM Take 2 mg by mouth as needed for diarrhea or loose stools. Do not exceed 8 doses in 24 hours   LORazepam 0.5 MG tablet Commonly known as:  ATIVAN Take 1 tablet (0.5 mg total) by mouth every 6 (six) hours as needed for anxiety. What changed:    when to take this  reasons to take this   magnesium hydroxide 400 MG/5ML suspension Commonly known as:  MILK OF MAGNESIA Take 30 mLs by mouth at bedtime as needed for mild constipation.   Melatonin 3 MG Tabs Take 3 mg by mouth at bedtime.   MINTOX 200-200-20 MG/5ML suspension Generic drug:  alum & mag hydroxide-simeth Take 30 mLs by mouth as needed for indigestion or heartburn. Do not exceed 4 doses in 24 hours   mirtazapine 15 MG tablet Commonly known as:  REMERON Take 15 mg by mouth at bedtime.   NUTRITIONAL DRINK PO Take 236 mLs by mouth 3 (three) times daily. MIGHTY SHAKES   PRESCRIPTION MEDICATION Apply 2 mg topically every 6 (six) hours as needed (severe agitation). Lorazepam 1 mg Gel   risperiDONE 0.5 MG tablet Commonly known as:  RISPERDAL Take 0.5 mg by mouth every 6 (six) hours as needed (severe agitation).   rivastigmine 13.3 MG/24HR Commonly known as:  EXELON Apply 1 patch (13.3 mg total) topically daily.   sertraline 25 MG tablet Commonly known as:  ZOLOFT Take 75 mg by mouth daily.   TRIPLE ANTIBIOTIC [phone] Oint Apply 1 application topically as needed (minor skin tears or abrasions).      Allergies  Allergen  Reactions  . Penicillins Other (See Comments)    Has patient had a PCN reaction causing immediate rash, facial/tongue/throat swelling, SOB or lightheadedness with hypotension: Unkown PCN reaction causing severe rash involving mucus membranes or skin necrosis: Unknown PCN reaction that required hospitalization: Unknown PCN reaction occurring within the last 10 years: No If all of the above answers are "NO", then may proceed with Cephalosporin use. Tolerated Cefepime, Ceftriaxone (06/2018)       The results of significant diagnostics from this hospitalization (including imaging, microbiology, ancillary and laboratory) are listed below for reference.    Significant Diagnostic Studies: Dg Abd 1 View  Result Date: 07/22/2018 CLINICAL DATA:  Vomiting, dementia EXAM: ABDOMEN - 1 VIEW COMPARISON:  Portable exam at 0922 hrs without priors for comparison FINDINGS: Nonobstructive bowel gas pattern. No bowel dilatation or bowel wall thickening. Increased stool in rectum. Small calcification 8 x 4 mm projects over inferior pole of RIGHT kidney. Large calcification in RIGHT upper quadrant, projecting over mid  RIGHT kidney, question 3.1 x 2.4 cm diameter calculus at RIGHT renal pelvis versus large densely calcified gallstone. Scattered pelvic phleboliths. Bones demineralized with degenerative disc disease changes at L4-L5. IMPRESSION: RIGHT renal calculi versus less likely cholelithiasis. Increased stool in rectum. Otherwise normal bowel gas pattern Electronically Signed   By: Ulyses SouthwardMark  Boles M.D.   On: 07/22/2018 10:10   Dg Chest Port 1 View  Result Date: 07/24/2018 CLINICAL DATA:  Dyspnea. EXAM: PORTABLE CHEST 1 VIEW COMPARISON:  Radiograph of July 22, 2018. FINDINGS: Stable cardiomediastinal silhouette. No pneumothorax is noted. New bibasilar opacities are noted concerning for edema or atelectasis with associated pleural effusions. Bony thorax is unremarkable. IMPRESSION: New bibasilar edema or  atelectasis is noted with associated pleural effusions. Electronically Signed   By: Lupita RaiderJames  Green Jr, M.D.   On: 07/24/2018 14:23   Dg Chest Port 1 View  Result Date: 07/22/2018 CLINICAL DATA:  Fever.  Sepsis. EXAM: PORTABLE CHEST 1 VIEW COMPARISON:  None. FINDINGS: Patient is rotated. Heart size and pulmonary vascularity are normal. Probable emphysematous changes in the lungs. No airspace disease or consolidation. No blunting of costophrenic angles. No pneumothorax. Chin mediastinal contours appear intact. IMPRESSION: Emphysematous changes in the lungs. No evidence of active pulmonary disease. Electronically Signed   By: Burman NievesWilliam  Stevens M.D.   On: 07/22/2018 03:34    Microbiology: Recent Results (from the past 240 hour(s))  Blood Culture (routine x 2)     Status: None (Preliminary result)   Collection Time: 07/22/18  3:36 AM  Result Value Ref Range Status   Specimen Description   Final    BLOOD LEFT WRIST Performed at [facility], [address] [place], [place] [address]    Special Requests   Final    BOTTLES DRAWN AEROBIC ONLY Blood Culture results may not be optimal due to an inadequate volume of blood received in culture bottles Performed at [facility], [address] [place], [place] [address]    Culture   Final    NO GROWTH 4 DAYS Performed at [facility], [address] [place], [place] [address]    Report Status PENDING  Incomplete  Blood Culture (routine x 2)     Status: None (Preliminary result)   Collection Time: 07/22/18  6:54 AM  Result Value Ref Range Status   Specimen Description   Final    BLOOD LEFT HAND BOTTLES DRAWN AEROBIC ONLY Blood Culture adequate volume Performed at [facility], [address] [place], [place] [address]    Special Requests   Final    NONE Performed at [facility], [address] [place], [place] [address]    Culture   Final    NO GROWTH 4  DAYS Performed at [facility], [address] [place], [place] [address]    Report Status PENDING  Incomplete  Urine culture     Status: Abnormal   Collection Time: 07/22/18  6:54 AM  Result Value Ref Range Status   Specimen Description   Final    URINE, RANDOM Performed at [facility], [address] [place], [place] [address]    Special Requests   Final    NONE Performed at [facility], [address] [place], [place] [address]    Culture (A)  Final    <10,000 COLONIES/mL INSIGNIFICANT GROWTH Performed at [facility], [address] [place], [place] [address]    Report  Status 07/24/2018 FINAL  Final  MRSA PCR Screening     Status: None   Collection Time: 07/22/18  7:22 AM  Result Value Ref Range Status   MRSA by PCR NEGATIVE NEGATIVE Final    Comment:        The GeneXpert MRSA Assay (FDA approved for NASAL specimens only), is one component of a comprehensive MRSA colonization surveillance program. It is not intended to diagnose MRSA infection nor to guide or monitor treatment for MRSA infections. Performed at [facility], [address], [place], [place] [address]      Labs: Basic Metabolic Panel: Recent Labs  Lab 07/22/18 0336 07/23/18 0401 07/24/18 0319 07/25/18 0320 07/26/18 0329  NA 141 140 144 144 143  K 4.5 3.4* 3.6 2.9* 3.7  CL 106 114* 118* 109 109  CO2 24 18* 19* 26 28  GLUCOSE 141* 146* 126* 120* 88  BUN 19 17 22 23 19   CREATININE 0.85 0.68 0.67 0.76 0.68  CALCIUM 8.9 8.3* 8.5* 8.3* 8.5*  MG  --  1.7  --   --   --   PHOS  --  1.6*  --   --   --    Liver Function Tests: Recent Labs  Lab 07/22/18 0336 07/23/18 0401  AST 36 36  ALT 17 30  ALKPHOS 78 40  BILITOT 0.5 0.5  PROT 6.8 5.5*  ALBUMIN 3.6 3.6   Recent Labs  Lab 07/22/18 0336  LIPASE 30   No results for input(s): AMMONIA in the last 168 hours. CBC: Recent Labs  Lab 07/22/18 0336 07/23/18 0401  07/23/18 0630  WBC 8.6 5.3 5.2  NEUTROABS 6.9 4.0  --   HGB 13.1 8.9* 9.2*  HCT 40.9 27.9* 28.6*  MCV 84.9 88.0 85.1  PLT 154 109* 112*   Cardiac Enzymes: No results for input(s): CKTOTAL, CKMB, CKMBINDEX, TROPONINI in the last 168 hours. BNP: BNP (last 3 results) Recent Labs    07/22/18 0336  BNP 99.8    ProBNP (last 3 results) No results for input(s): PROBNP in the last 8760 hours.  CBG: Recent Labs  Lab 07/25/18 1659 07/25/18 1933 07/25/18 2328 07/26/18 0411 07/26/18 0754  GLUCAP 134* 201* 82 77 79       Signed:  Meredeth Ide MD.  [facility] 07/26/2018, 11:03 AM

## 2018-07-25 LAB — BASIC METABOLIC PANEL
Anion gap: 9 (ref 5–15)
BUN: 23 mg/dL (ref 8–23)
CHLORIDE: 109 mmol/L (ref 98–111)
CO2: 26 mmol/L (ref 22–32)
Calcium: 8.3 mg/dL — ABNORMAL LOW (ref 8.9–10.3)
Creatinine, Ser: 0.76 mg/dL (ref 0.44–1.00)
GFR calc Af Amer: >60 mL/min (ref >60)
GFR calc non Af Amer: >60 mL/min (ref >60)
Glucose, Bld: 120 mg/dL — ABNORMAL HIGH (ref 70–99)
POTASSIUM: 2.9 mmol/L — AB (ref 3.5–5.1)
SODIUM: 144 mmol/L (ref 135–145)

## 2018-07-25 LAB — GLUCOSE, CAPILLARY
GLUCOSE-CAPILLARY: 89 mg/dL (ref 70–99)
GLUCOSE-CAPILLARY: 133 mg/dL — AB (ref 70–99)
Glucose-Capillary: 109 mg/dL — ABNORMAL HIGH (ref 70–99)
Glucose-Capillary: 134 mg/dL — ABNORMAL HIGH (ref 70–99)
Glucose-Capillary: 201 mg/dL — ABNORMAL HIGH (ref 70–99)
Glucose-Capillary: 82 mg/dL (ref 70–99)

## 2018-07-25 MED ORDER — POTASSIUM CHLORIDE 10 MEQ/100ML IV SOLN
10.0000 meq | INTRAVENOUS | Status: AC
  Administered 2018-07-25 (×4): 10 meq via INTRAVENOUS
  Filled 2018-07-25 (×4): qty 100

## 2018-07-25 MED ORDER — SODIUM CHLORIDE 0.9 % IV BOLUS
250.0000 mL | Freq: Once | INTRAVENOUS | Status: AC
  Administered 2018-07-25: 250 mL via INTRAVENOUS

## 2018-07-25 NOTE — Care Management Note (Signed)
Case Management Note  Patient Details  Name: Toni Sullivan MRN: [id number] Date of Birth: 11/27/1950  Subjective/Objective:                  1. Sepsis due to UTI-sepsis physiology has resolved, urine culture has been obtained and result is currently pending.  Patient had abnormal UA on presentation.  Continue empiric ceftriaxone.  Will follow urine culture results.  Will wean off Solu-Cortef.  Change to Solu-Cortef 25 mg every 12 hours.  2. Pulmonary edema-chest x-ray shows bibasilar pulmonary edema, start Lasix 40 mg every 12 hours.  Strict intake and output.  Check BMP in a.m.  3. Hypotension-secondary to above, resolved.  Patient has been weaned off Levophed.  Will monitor.  4. ? New onset atrial fibrillation versus artifact-patient had transient atrial fibrillation, cardiology was consulted and they reviewed the EKG patient does not have A. fib as per cardiology.    5. Bradycardia-patient's heart rate dropped to 40s this morning, she is not on medications which can affect heart rate.  She is currently asymptomatic.  She is not a candidate for pacemaker insertion as per cardiology.  Echocardiogram obtained yesterday showed no significant abnormality, EF 65 to 70%.  6. Dementia-with baseline mood disorder, depression anxiety.  Continue Depakote, Remeron, Risperdal, Zoloft.  7. Prior history of CVA/TIA-stable  Action/Plan: Following for progression of care. Following for cm needs, none present at this time, no discharge plans at this time.  Expected Discharge Date:  07/24/18               Expected Discharge Plan:  Skilled Nursing Facility  In-House Referral:  Clinical Social Work  Discharge planning Services  CM Consult  Post Acute Care Choice:    Choice offered to:     DME Arranged:    DME Agency:     HH Arranged:    HH Agency:     Status of Service:  Completed, signed off  If discussed at [employer] Meetings, dates discussed:    Additional  Comments:  [name], Reem Fleury Lynn, RN 07/25/2018, 9:29 AM

## 2018-07-25 NOTE — Progress Notes (Signed)
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  Patient usually has baseline confusion and has been getting worse as per patient's husband.  She was agitated with altered mental status.  Upon arrival in the ED patient was found to have, temperature 1-2.4, she was hypotensive code sepsis activated.  Patient was put on vasopressors.  PCCM was consulted.  Patient has now been weaned off vasopressors and is transferred back to [facility].    Subjective   Patient seen and examined, breathing improved after started on IV Lasix yesterday.  Urine output 3000 cc since yesterday.   Assessment/Plan:     1. Sepsis due to UTI-sepsis physiology has resolved, urine culture was obtained and only showed insignificant growth.  Patient was started on empiric ceftriaxone.  Solu-Cortef is currently weaned off.  Continue Omnicef.  2. Pulmonary edema-chest x-ray shows bibasilar pulmonary edema, started Lasix 40 mg every 12 hours.  Strict intake and output.  Good urine output of 3000 cc since yesterday.  Check BMP in a.m.  3. Hypokalemia-potassium is 2.9, will replace potassium with IV KCl 10 mEq x 4.  Check BMP in am.  4. Hypotension-secondary to above, resolved.  Patient has been weaned off Levophed.  Continue to monitor.  5. ? New onset atrial fibrillation versus artifact-patient had transient atrial fibrillation, cardiology was consulted and they reviewed the EKG patient does not have A. fib as per cardiology.    6. Bradycardia-patient's heart rate dropped to 40s this morning, she is not on medications which can affect heart rate.  She is currently asymptomatic.  She is not a candidate for pacemaker  insertion as per cardiology.  Echocardiogram obtained yesterday showed no significant abnormality, EF 65 to 70%.  7. Dementia-with baseline mood disorder, depression anxiety.  Continue Depakote, Remeron, Risperdal, Zoloft.  8. Prior history of CVA/TIA-stable     CBG: Recent Labs  Lab 07/24/18 1936 07/24/18 2322 07/25/18 0353 07/25/18 0723 07/25/18 1201  GLUCAP 146* 96 109* 89 133*    CBC: Recent Labs  Lab 07/22/18 0336 07/23/18 0401 07/23/18 0630  WBC 8.6 5.3 5.2  NEUTROABS 6.9 4.0  --   HGB 13.1 8.9* 9.2*  HCT 40.9 27.9* 28.6*  MCV 84.9 88.0 85.1  PLT 154 109* 112*    Basic Metabolic Panel: Recent Labs  Lab 07/22/18 0336 07/23/18 0401 07/24/18 0319 07/25/18 0320  NA 141 140 144 144  K 4.5 3.4* 3.6 2.9*  CL 106 114* 118* 109  CO2 24 18* 19* 26  GLUCOSE 141* 146* 126* 120*  BUN 19 17 22 23   CREATININE 0.85 0.68 0.67 0.76  CALCIUM 8.9 8.3* 8.5* 8.3*  MG  --  1.7  --   --   PHOS  --  1.6*  --   --      DVT prophylaxis: Lovenox  Code Status: DNR  Family Communication: Discussed with patient's husband at bedside  Disposition Plan: Skilled nursing facility   Consultants:  PCCM  Procedures:  None   Antibiotics:   Anti-infectives (From admission, onward)   Start     Dose/Rate Route Frequency Ordered Stop   07/24/18 2300  cefdinir (OMNICEF) 125 MG/5ML suspension 300 mg     300 mg Oral 2  times daily 07/24/18 2204 07/29/18 0959   07/24/18 1000  levofloxacin (LEVAQUIN) tablet 250 mg  Status:  Discontinued     250 mg Oral Daily 07/23/18 1450 07/24/18 0823   07/24/18 1000  cefdinir (OMNICEF) capsule 300 mg  Status:  Discontinued     300 mg Oral Every 12 hours 07/24/18 0823 07/24/18 2205   07/24/18 0000  cefdinir (OMNICEF) 300 MG capsule  Status:  Discontinued     300 mg Oral Every 12 hours 07/24/18 0834 07/24/18    07/24/18 0000  cefdinir (OMNICEF) 300 MG capsule     300 mg Oral Every 12 hours 07/24/18 1220 07/29/18 2359   07/22/18 1200   cefTRIAXone (ROCEPHIN) 1 g in sodium chloride 0.9 % 100 mL IVPB  Status:  Discontinued     1 g 200 mL/hr over 30 Minutes Intravenous Every 24 hours 07/22/18 0719 07/23/18 1450   07/22/18 0330  ceFEPIme (MAXIPIME) 2 g in sodium chloride 0.9 % 100 mL IVPB     2 g 200 mL/hr over 30 Minutes Intravenous  Once 07/22/18 0322 07/22/18 0425   07/22/18 0315  aztreonam (AZACTAM) 2 g in sodium chloride 0.9 % 100 mL IVPB  Status:  Discontinued     2 g 200 mL/hr over 30 Minutes Intravenous  Once 07/22/18 0311 07/22/18 0321   07/22/18 0315  metroNIDAZOLE (FLAGYL) IVPB 500 mg  Status:  Discontinued     500 mg 100 mL/hr over 60 Minutes Intravenous Every 8 hours 07/22/18 0311 07/23/18 1422   07/22/18 0315  vancomycin (VANCOCIN) IVPB 1000 mg/200 mL premix     1,000 mg 200 mL/hr over 60 Minutes Intravenous  Once 07/22/18 0311 07/22/18 0522       Objective   Vitals:   07/25/18 1100 07/25/18 1130 07/25/18 1200 07/25/18 1300  BP:   (!) 129/95   Pulse: 60 61  66  Resp: (!) 29 14 14 16   Temp:      TempSrc:      SpO2: 94% 96%  96%  Weight:      Height:        Intake/Output Summary (Last 24 hours) at 07/25/2018 1320 Last data filed at 07/25/2018 1047 Gross per 24 hour  Intake 100.62 ml  Output 3000 ml  Net -2899.38 ml   Filed Weights   07/22/18 0353  Weight: 65.8 kg     Physical Examination:     General: Appears pleasantly confused  Cardiovascular: S1-S2, regular, no murmur auscultated  Respiratory: Decreased breath sounds at lung bases  Abdomen: Soft, nontender, no organomegaly  Musculoskeletal: No edema in the lower extremities      Data Reviewed: I have personally reviewed following labs and imaging studies   Recent Results (from the past 240 hour(s))  Blood Culture (routine x 2)     Status: None (Preliminary result)   Collection Time: 07/22/18  3:36 AM  Result Value Ref Range Status   Specimen Description   Final    BLOOD LEFT WRIST Performed at [facility], [address] [place], [place] [address]    Special Requests   Final    BOTTLES DRAWN AEROBIC ONLY Blood Culture results may not be optimal due to an inadequate volume of blood received in culture bottles Performed at [facility], [address], [place], [place] [address]    Culture   Final    NO GROWTH 3 DAYS Performed at [facility], [address], [place], [place]  [address]    Report Status PENDING  Incomplete  Blood Culture (routine x 2)     Status: None (Preliminary result)   Collection Time: 07/22/18  6:54 AM  Result Value Ref Range Status   Specimen Description   Final    BLOOD LEFT HAND BOTTLES DRAWN AEROBIC ONLY Blood Culture adequate volume Performed at [facility], [address], [place], [place] [address]    Special Requests   Final    NONE Performed at [facility], [address] [place], [place] [address]    Culture   Final    NO GROWTH 3 DAYS Performed at [facility], [address] [place], [place] [address]    Report Status PENDING  Incomplete  Urine culture     Status: Abnormal   Collection Time: 07/22/18  6:54 AM  Result Value Ref Range Status   Specimen Description   Final    URINE, RANDOM Performed at [facility], [address] [place], [place] [address]    Special Requests   Final    NONE Performed at [facility], [address], [place], [place] [address]    Culture (A)  Final    <10,000 COLONIES/mL INSIGNIFICANT GROWTH Performed at [facility], [address], [place], [place] [address]    Report Status 07/24/2018 FINAL  Final  MRSA PCR Screening     Status: None   Collection Time: 07/22/18  7:22 AM  Result Value Ref Range Status   MRSA by PCR NEGATIVE NEGATIVE Final    Comment:        The GeneXpert MRSA Assay (FDA approved for NASAL specimens only), is one component of a comprehensive MRSA  colonization surveillance program. It is not intended to diagnose MRSA infection nor to guide or monitor treatment for MRSA infections. Performed at [facility], [address], [place], [place] [address]      Liver Function Tests: Recent Labs  Lab 07/22/18 0336 07/23/18 0401  AST 36 36  ALT 17 30  ALKPHOS 78 40  BILITOT 0.5 0.5  PROT 6.8 5.5*  ALBUMIN 3.6 3.6   Recent Labs  Lab 07/22/18 0336  LIPASE 30   No results for input(s): AMMONIA in the last 168 hours.  Cardiac Enzymes: No results for input(s): CKTOTAL, CKMB, CKMBINDEX, TROPONINI in the last 168 hours. BNP (last 3 results) Recent Labs    07/22/18 0336  BNP 99.8    ProBNP (last 3 results) No results for input(s): PROBNP in the last 8760 hours.    Studies: Dg Chest Port 1 View  Result Date: 07/24/2018 CLINICAL DATA:  Dyspnea. EXAM: PORTABLE CHEST 1 VIEW COMPARISON:  Radiograph of July 22, 2018. FINDINGS: Stable cardiomediastinal silhouette. No pneumothorax is noted. New bibasilar opacities are noted concerning for edema or atelectasis with associated pleural effusions. Bony thorax is unremarkable. IMPRESSION: New bibasilar edema or atelectasis is noted with associated pleural effusions. Electronically Signed   By: Lupita Raider, M.D.   On: 07/24/2018 14:23    Scheduled Meds: . cefdinir  300 mg Oral BID  . divalproex  250 mg Oral TID  . enoxaparin (LOVENOX) injection  40 mg Subcutaneous Q24H  . furosemide  40 mg Intravenous Q12H  . hydrocortisone sod succinate (SOLU-CORTEF) inj  25 mg Intravenous Q12H  . mirtazapine  15 mg Oral QHS  . sertraline  75 mg Oral Daily    Admission status- Inpatient: Based on  patients clinical presentation and evaluation of above clinical data, I have made determination that patient meets Inpatient criteria at this time.  Patient is admitted with sepsis due to UTI, on IV ceftriaxone.  Now has pulmonary edema requiring IV Lasix. Time spent: 25  min  Meredeth IdeGagan S Chosen Garron   [facility] Pager [phone]. If 7PM-7AM, please contact night-coverage at [email or web address], Office  [phone]  password [email or web address]  07/25/2018, 1:20 PM  LOS: 3 days

## 2018-07-25 NOTE — Evaluation (Signed)
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    Clinical Impression   Pt was admitted for the above.  At baseline, she lives in memory care and ambulates herself, although she has had falls.  She needs assistance for all adls. Will trial OT in acute setting with min A level goals for toilet transfers and for mobility related to adls    Follow Up Recommendations  SNF(unless memory care can provide +2 assistance for mobility/LB)    Equipment Recommendations  3 in 1 bedside commode    Recommendations for Other Services       Precautions / Restrictions Precautions Precautions: Fall Restrictions Weight Bearing Restrictions: No      Mobility Bed Mobility     Rolling: Total assist(for hygiene)   Supine to sit: Mod assist;HOB elevated Sit to supine: Max assist   General bed mobility comments: assist for trunk and legs  Transfers   Equipment used: 2 person hand held assist   Sit to Stand: Mod assist;+2 physical assistance         General transfer comment: posterior lean; assist to stand and balance during SPT    Balance     Sitting balance-Leahy Scale: Poor Sitting balance - Comments: min guard to mod A; tends to lean posteriorly.  Shifted herself forward when food was presented     Standing balance-Leahy Scale: Poor                             ADL either performed or assessed with clinical judgement   ADL Overall ADL's : Needs assistance/impaired                                       General ADL Comments: pt needs assistance with all adls at baseline. Sat EOB and tried to help hold cup.  Pt needed min guard to mod A for trunk  sitting for 20 minutes during lunch.  Husband fed her, and she tried to help with cup.  Therapist assisted pt into supine when she was getting tired, and pt's demeanor changed to agitated. She had a BM, so therapist and OT cleaned her then assisted her into chair and she was much calmer there once we repositioned lines around her and replaced catheter     Vision         Perception     Praxis      Pertinent Vitals/Pain Pain Assessment: Faces Faces Pain Scale: No hurt     Hand Dominance     Extremity/Trunk Assessment Upper Extremity Assessment Upper Extremity Assessment: Generalized weakness       Cervical / Trunk Assessment Cervical / Trunk Assessment: Kyphotic Cervical / Trunk Exceptions: tends to lose balance posteriorly; trunk stiff   Communication Communication Communication: Expressive difficulties(answered some yes/no; told husband she loved him)   Cognition Arousal/Alertness: Awake/alert Behavior During Therapy: (pleasant at first then agitated) Overall Cognitive Status: History of cognitive impairments - at baseline  General Comments: pt initiated movement in context   General Comments       Exercises     Shoulder Instructions      Home Living                                   Additional Comments: pt is from memory care      Prior Functioning/Environment      ADL's / Homemaking Assistance Needed: assist for all ADL's   Comments: pt gets up and walks herself but has had several falls        OT Problem List: Decreased strength;Decreased activity tolerance;Impaired balance (sitting and/or standing);Decreased cognition;Decreased safety awareness      OT Treatment/Interventions: Self-care/ADL training;DME and/or AE instruction;Therapeutic activities;Patient/family education;Balance training;Cognitive remediation/compensation    OT Goals(Current goals can be found in the care plan section)  Acute Rehab OT Goals Patient Stated Goal: to return to memory care OT Goal Formulation: With family Time For Goal Achievement: 08/08/18 Potential to Achieve Goals: Fair ADL Goals Pt Will Transfer to Toilet: with min assist;bedside commode;stand pivot transfer Additional ADL Goal #1: pt will go from sit to stand and maintain for 2 minutes at this level for adls Additional ADL Goal #2: pt will sit eob x 5 minutes with min guard for adls and in preparation for functional transfers  OT Frequency: Min 2X/week   Barriers to D/C:            Co-evaluation              AM-PAC OT "6 Clicks" Daily Activity     Outcome Measure Help from another person eating meals?: Total Help from another person taking care of personal grooming?: Total Help from another person toileting, which includes using toliet, bedpan, or urinal?: Total Help from another person bathing (including washing, rinsing, drying)?: Total Help from another person to put on and taking off regular upper body clothing?: Total Help from another person to put on and taking off regular lower body clothing?: Total 6 Click Score: 6   End of Session    Activity Tolerance: Patient tolerated treatment well Patient left: in chair;with call bell/phone within reach;with chair alarm set;with family/visitor present  OT Visit Diagnosis: Unsteadiness on feet (R26.81)                Time: 4098-1191 OT Time Calculation (min): 55 min Charges:  OT General Charges $OT Visit: 1 Visit OT Evaluation $OT Eval Low Complexity: 1 Low OT Treatments $Self Care/Home Management : 8-22 mins $Therapeutic Activity: 23-37 mins  Marica Otter, OTR/L Acute Rehabilitation Services [phone] [facility] pager [phone] office 07/25/2018  Ezio Wieck 07/25/2018, 2:50 PM

## 2018-07-25 NOTE — Care Management Important Message (Signed)
Important Message  Patient Details  Name: Toni Sullivan MRN: [id number] Date of Birth: 02/02/1951   Medicare Important Message Given:  Yes. Patient husband (legal guardian) signed IM.     Darcey Demma 07/25/2018, 10:03 AM

## 2018-07-26 LAB — BASIC METABOLIC PANEL
Anion gap: 6 (ref 5–15)
BUN: 19 mg/dL (ref 8–23)
CHLORIDE: 109 mmol/L (ref 98–111)
CO2: 28 mmol/L (ref 22–32)
Calcium: 8.5 mg/dL — ABNORMAL LOW (ref 8.9–10.3)
Creatinine, Ser: 0.68 mg/dL (ref 0.44–1.00)
GFR calc non Af Amer: >60 mL/min (ref >60)
Glucose, Bld: 88 mg/dL (ref 70–99)
POTASSIUM: 3.7 mmol/L (ref 3.5–5.1)
Sodium: 143 mmol/L (ref 135–145)

## 2018-07-26 LAB — GLUCOSE, CAPILLARY
GLUCOSE-CAPILLARY: 100 mg/dL — AB (ref 70–99)
GLUCOSE-CAPILLARY: 79 mg/dL (ref 70–99)
Glucose-Capillary: 77 mg/dL (ref 70–99)
Glucose-Capillary: 83 mg/dL (ref 70–99)
Glucose-Capillary: 96 mg/dL (ref 70–99)
Glucose-Capillary: 124 mg/dL — ABNORMAL HIGH (ref 70–99)

## 2018-07-26 MED ORDER — FUROSEMIDE 20 MG PO TABS: 20.0000 mg | ORAL_TABLET | Freq: Every day | ORAL | 11 refills | Status: DC

## 2018-07-26 NOTE — Clinical Social Work Note (Addendum)
Clinical Social Work Assessment  Patient Details  Name: Toni Sullivan MRN: [id number] Date of Birth: 03/25/1951  Date of referral:  07/26/18               Reason for consult:  Facility Placement                Permission sought to share information with:  Family Supports, Magazine features editoracility Contact Representative Permission granted to share information::  Yes, Verbal Permission Granted  Name::       Krage,[name]  Agency::  SNF for rehab  Relationship::   Spouse   Contact Information:    [phone]/ [phone]   Housing/Transportation Living arrangements for the past 2 months:  (Memorycare) Source of Information:  Spouse Patient Interpreter Needed:  None Criminal Activity/Legal Involvement Pertinent to Current Situation/Hospitalization:  No - Comment as needed Significant Relationships:    Lives with:   Facility Care.  Do you feel safe going back to the place where you live?  Yes Need for family participation in patient care:  Yes, patient has dementia.    Care giving concerns:  Chief Complaint: Vomiting and fever at SNF Patient has dementia. Due to persistent vomiting and fever patient was brought in early this morning for further evaluation.  Per her husband, her baseline is conversational in the setting of dementia and ambulatory slowly but without any assistance.  She was in her normal state of health prior to this.    Social Worker assessment / plan:  CSW discussed discharge plan to SNF with the patient spouse. He is agreeable the patient needs to regain her strength before [facility]. At the facility the patient walks without assistance. The patient is currently requiring 2+ assist. Patient spouse reports she needs help with all her ADL's.  CSW [place] per spouse. CSW made referral with [employer] insurance to start authorization process.  CSW will inform medical staff when insurance authorization has been  received.   Plan:SNF   Employment status:    Insurance information:   [employer] PT Recommendations:  Skilled Nursing Facility Information / Referral to community resources:     Patient/Family's Response to care:  Agreeable and Responding well to care.   Patient/Family's Understanding of and Emotional Response to Diagnosis, Current Treatment, and Prognosis: Patient spouse has dementia.  Patient spouse at bedside and has good understanding of patient care and discharge plan.   Emotional Assessment Appearance:  Appears stated age Attitude/Demeanor/Rapport:    Affect (typically observed):  Calm Orientation:  Oriented to Self Alcohol / Substance use:  Not Applicable Psych involvement (Current and /or in the community):  No (Comment)  Discharge Needs  Concerns to be addressed:  Discharge Planning Concerns Readmission within the last 30 days:    Current discharge risk:  Dependent with Mobility Barriers to Discharge:  Continued Medical Work up   [employer] A Hodge Stachnik, LCSW 07/26/2018, 11:17 AM

## 2018-07-27 DIAGNOSIS — G9341 Metabolic encephalopathy: Secondary | ICD-10-CM

## 2018-07-27 LAB — CBC
HCT: 35.2 % — ABNORMAL LOW (ref 36.0–46.0)
Hemoglobin: 11.3 g/dL — ABNORMAL LOW (ref 12.0–15.0)
MCH: 27.7 pg (ref 26.0–34.0)
MCHC: 32.1 g/dL (ref 30.0–36.0)
MCV: 86.3 fL (ref 80.0–100.0)
Platelets: 210 10*3/uL (ref 150–400)
RBC: 4.08 MIL/uL (ref 3.87–5.11)
RDW: 14.1 % (ref 11.5–15.5)
WBC: 7.1 10*3/uL (ref 4.0–10.5)
nRBC: 0 % (ref 0.0–0.2)

## 2018-07-27 LAB — CULTURE, BLOOD (ROUTINE X 2)
Culture: NO GROWTH
Culture: NO GROWTH

## 2018-07-27 LAB — GLUCOSE, CAPILLARY
Glucose-Capillary: 84 mg/dL (ref 70–99)
Glucose-Capillary: 81 mg/dL (ref 70–99)
Glucose-Capillary: 89 mg/dL (ref 70–99)

## 2018-07-27 MED ORDER — FAMOTIDINE 20 MG PO TABS
20.0000 mg | ORAL_TABLET | Freq: Two times a day (BID) | ORAL | Status: DC
  Administered 2018-07-27 – 2018-07-31 (×9): 20 mg via ORAL
  Filled 2018-07-27 (×13): qty 1

## 2018-07-27 NOTE — Progress Notes (Signed)
PROGRESS NOTE   SEMIRA STOLTZFUS  WUJ:[id number]    DOB: 08-14-1951    DOA: 07/22/2018  PCP: Excell Seltzer, MD   I have briefly reviewed patients previous medical records in [facility].  Brief Narrative:  67 year old married female, resident of memory care unit/[facility], ambulated without assistance and conversational at baseline PTA, PMH of advanced dementia with agitation/aggressive behavior, CVA without residual deficits,?  Dementia/anxiety, presented to [facility] long ED on 08/21/2018 with high fever of 102 F, AMS, agitation and vomiting.  She was admitted to ICU for septic shock presumably due to UTI source, acute on chronic encephalopathy due to sepsis, suspected new onset A. fib.  CCM was consulted.  She required vasopressors which were then weaned off.  Cardiology was consulted and did not feel that she had A. Fib.  Assessment and plan:  1. Septic shock due to presumed UTI: Had to be admitted to ICU.  Treated aggressively per sepsis protocol with IV fluids.  Briefly required vasopressors and steroids, now both weaned off.  Empirically treated with IV ceftriaxone initially.  Blood cultures x2: No growth, urine culture showed insignificant growth and MRSA PCR was negative.  However given strong suspicion for UTI, patient was then transitioned to [facility] to complete total 7 days course.  Sepsis resolved. 2. Acute toxic metabolic encephalopathy: Likely due to septic shock complicating underlying advanced dementia.  At baseline patient reportedly is agitated/aggressive and is on Depakote.  Agitation seems to have improved.  Mental status may be approaching baseline but will need family's input regarding this. 3. Advanced dementia: As discussed above.  Continue Prolixin, risperidone, Exelon, Zoloft, as needed Ativan, Remeron and Depakote.  As per report, at her memory care unit patient was ambulating without assistance.  Currently quite weak requiring 2 person assist and able to ambulate  80 feet.  SNF explored but reportedly declined by her insurance who are requesting a peer to peer with MD, will perform later. 4. Sinus bradycardia: Evaluated by cardiology.  Asymptomatic.  Not a candidate for pacemaker.  She is not on medications that would contribute to this.  TTE: LVEF 65-70%. 5. Pulmonary edema/acute diastolic CHF: May have been due to aggressive volume resuscitation for septic shock.  Required IV Lasix.  Currently appears euvolemic.  Now transitioned to Lasix 20 mg daily. 6. Hypokalemia: Replaced.  Magnesium 1.7. 7. Normocytic anemia: Could have been due to acute illness/sepsis.  No bleeding reported.  Improved.  Periodically follow CBCs as outpatient. 8. Transient thrombocytopenia: Likely due to sepsis.  Resolved. 9. Prior history of CVA: No acute issues.    DVT prophylaxis: Lovenox Code Status: DNR Family Communication: None at bedside Disposition: Determined pending peer-to-peer discussion with patient's insurance company.   Consultants:  CCM Cardiology  Procedures:  None  Antimicrobials:  Rocephin-discontinued Omnicef   Subjective: Patient currently sleeping, easily arousable and opens eyes, oriented only to self.  Intermittently yells out.  As per RN, patient usually confused, unable to understand speech, needs to be fed.  No other acute issues reported.  ROS: As above, otherwise unable due to mental status changes.  Objective:  Vitals:   07/27/18 0200 07/27/18 0400 07/27/18 0600 07/27/18 0903  BP: 111/63 102/62 105/73 119/79  Pulse: (!) 52 (!) 50 (!) 55 (!) 58  Resp: 19 13 (!) 22 18  Temp:  (!) 96.6 F (35.9 C)  98.1 F (36.7 C)  TempSrc:  Axillary  Oral  SpO2: 91% 93% 93% 96%  Weight:  Height:        Examination:  General exam: Elderly female, moderately built and thinly nourished, frail, lying comfortably supine in bed. Respiratory system: Clear to auscultation. Respiratory effort normal. Cardiovascular system: S1 & S2 heard, RRR.  No JVD, murmurs, rubs, gallops or clicks. No pedal edema.  Telemetry personally reviewed: Sinus rhythm in the 60s.  However this is only recording from today.  I am unable to access prior telemetry readings. Gastrointestinal system: Abdomen is nondistended, soft and nontender. No organomegaly or masses felt. Normal bowel sounds heard. Central nervous system: Mental status as above.. No focal neurological deficits. Extremities: Symmetric 5 x 5 power. Skin: No rashes, lesions or ulcers Psychiatry: Judgement and insight impaired. Mood & affect cannot be assessed.    Data Reviewed: I have personally reviewed following labs and imaging studies  CBC: Recent Labs  Lab 07/22/18 0336 07/23/18 0401 07/23/18 0630 07/27/18 0738  WBC 8.6 5.3 5.2 7.1  NEUTROABS 6.9 4.0  --   --   HGB 13.1 8.9* 9.2* 11.3*  HCT 40.9 27.9* 28.6* 35.2*  MCV 84.9 88.0 85.1 86.3  PLT 154 109* 112* 210   Basic Metabolic Panel: Recent Labs  Lab 07/22/18 0336 07/23/18 0401 07/24/18 0319 07/25/18 0320 07/26/18 0329  NA 141 140 144 144 143  K 4.5 3.4* 3.6 2.9* 3.7  CL 106 114* 118* 109 109  CO2 24 18* 19* 26 28  GLUCOSE 141* 146* 126* 120* 88  BUN 19 17 22 23 19   CREATININE 0.85 0.68 0.67 0.76 0.68  CALCIUM 8.9 8.3* 8.5* 8.3* 8.5*  MG  --  1.7  --   --   --   PHOS  --  1.6*  --   --   --    Liver Function Tests: Recent Labs  Lab 07/22/18 0336 07/23/18 0401  AST 36 36  ALT 17 30  ALKPHOS 78 40  BILITOT 0.5 0.5  PROT 6.8 5.5*  ALBUMIN 3.6 3.6   CBG: Recent Labs  Lab 07/26/18 1628 07/26/18 1940 07/26/18 2354 07/27/18 0337 07/27/18 0746  GLUCAP 96 124* 100* 84 81    Recent Results (from the past 240 hour(s))  Blood Culture (routine x 2)     Status: None   Collection Time: 07/22/18  3:36 AM  Result Value Ref Range Status   Specimen Description   Final    BLOOD LEFT WRIST Performed at [facility], [address] [place], [place] [address]    Special Requests   Final     BOTTLES DRAWN AEROBIC ONLY Blood Culture results may not be optimal due to an inadequate volume of blood received in culture bottles Performed at [facility], [address] [place], [place] [address]    Culture   Final    NO GROWTH 5 DAYS Performed at [facility], [address], [place], [place] [address]    Report Status 07/27/2018 FINAL  Final  Blood Culture (routine x 2)     Status: None   Collection Time: 07/22/18  6:54 AM  Result Value Ref Range Status   Specimen Description   Final    BLOOD LEFT HAND BOTTLES DRAWN AEROBIC ONLY Blood Culture adequate volume Performed at [facility], [address] [place], [place] [address]    Special Requests   Final    NONE Performed at [facility], [address], [place], [place] [address]    Culture   Final  NO GROWTH 5 DAYS Performed at [facility], [address] [place], [place] [address]    Report Status 07/27/2018 FINAL  Final  Urine culture     Status: Abnormal   Collection Time: 07/22/18  6:54 AM  Result Value Ref Range Status   Specimen Description   Final    URINE, RANDOM Performed at [facility], [address] [place], [place] [address]    Special Requests   Final    NONE Performed at [facility], [address] [place], [place] [address]    Culture (A)  Final    <10,000 COLONIES/mL INSIGNIFICANT GROWTH Performed at [facility], [address] [place], [place] [address]    Report Status 07/24/2018 FINAL  Final  MRSA PCR Screening     Status: None   Collection Time: 07/22/18  7:22 AM  Result Value Ref Range Status   MRSA by PCR NEGATIVE NEGATIVE Final    Comment:        The GeneXpert MRSA Assay (FDA approved for NASAL specimens only), is one component of a comprehensive MRSA colonization surveillance program. It is not intended to diagnose MRSA infection nor to guide or monitor  treatment for MRSA infections. Performed at [facility], [address] [place], [place] [address]          Radiology Studies: No results found.      Scheduled Meds: . cefdinir  300 mg Oral BID  . divalproex  250 mg Oral TID  . enoxaparin (LOVENOX) injection  40 mg Subcutaneous Q24H  . famotidine  20 mg Oral BID  . mirtazapine  15 mg Oral QHS  . sertraline  75 mg Oral Daily   Continuous Infusions: . albumin human    . sodium chloride       LOS: 5 days     Marcellus ScottAnand Muranda Coye, MD, FACP, [facility]. [facility] Pager [phone]  If 7PM-7AM, please contact night-coverage [email or web address] Password [email or web address] 07/27/2018, 12:37 PM

## 2018-07-27 NOTE — Progress Notes (Signed)
Addendum  I did a peer to peer discussion with Dr. Logan BoresEvans and reviewed the case in detail.  He did not seem optimistic of patient's rehab potential but requested that we have PT re assess her and PT to directly call him and await further recommendations.  I updated clinical social work.  Marcellus ScottAnand Hongalgi, MD, FACP, [facility]. [facility] Pager [phone]  If 7PM-7AM, please contact night-coverage [email or web address] Password [email or web address] 07/27/2018, 6:15 PM

## 2018-07-27 NOTE — Progress Notes (Signed)
Patient HTA insurance is currently under review by the HTA Wellsite geologistmedical director.

## 2018-07-27 NOTE — Progress Notes (Signed)
Pt does continue to be bradycardic in the 50s throughout the night-asymptomatic.

## 2018-07-27 NOTE — Progress Notes (Signed)
PHARMACIST - PHYSICIAN COMMUNICATION  DR:   Waymon AmatoHongalgi  CONCERNING: IV to Oral Route Change Policy  RECOMMENDATION: This patient is receiving famotidine by the intravenous route.  Based on criteria approved by the Pharmacy and Therapeutics Committee, the intravenous medication(s) is/are being converted to the equivalent oral dose form(s).   DESCRIPTION: These criteria include:  The patient is eating (either orally or via tube) and/or has been taking other orally administered medications for a least 24 hours  The patient has no evidence of active gastrointestinal bleeding or impaired GI absorption (gastrectomy, short bowel, patient on TNA or NPO).  If you have questions about this conversion, please contact the Pharmacy Department  []   [phone]( [phone] )  [facility] []   [phone]( [phone] )  [facility] []   [phone]( [phone] )  [facility] []   [phone]( [phone] )  [facility] [x]   [phone]( [phone] )  [facility]   Valentina GuChristy, Demetreus Lothamer D, [facility]PH 07/27/2018 10:17 AM

## 2018-07-28 DIAGNOSIS — F0391 Unspecified dementia with behavioral disturbance: Secondary | ICD-10-CM

## 2018-07-28 LAB — GLUCOSE, CAPILLARY
GLUCOSE-CAPILLARY: 105 mg/dL — AB (ref 70–99)
GLUCOSE-CAPILLARY: 77 mg/dL (ref 70–99)
GLUCOSE-CAPILLARY: 80 mg/dL (ref 70–99)
Glucose-Capillary: 121 mg/dL — ABNORMAL HIGH (ref 70–99)
Glucose-Capillary: 74 mg/dL (ref 70–99)
Glucose-Capillary: 80 mg/dL (ref 70–99)
Glucose-Capillary: 91 mg/dL (ref 70–99)

## 2018-07-28 MED ORDER — MELATONIN 3 MG PO TABS
3.0000 mg | ORAL_TABLET | Freq: Every day | ORAL | Status: DC
  Administered 2018-07-28 – 2018-07-30 (×3): 3 mg via ORAL
  Filled 2018-07-28 (×3): qty 1

## 2018-07-28 MED ORDER — RIVASTIGMINE 13.3 MG/24HR TD PT24
13.3000 mg | MEDICATED_PATCH | Freq: Every day | TRANSDERMAL | Status: DC
  Administered 2018-07-28 – 2018-07-31 (×4): 13.3 mg via TRANSDERMAL
  Filled 2018-07-28 (×4): qty 1

## 2018-07-28 MED ORDER — FLUPHENAZINE HCL 1 MG PO TABS
1.0000 mg | ORAL_TABLET | Freq: Every day | ORAL | Status: DC
  Administered 2018-07-28 – 2018-07-30 (×3): 1 mg via ORAL
  Filled 2018-07-28 (×3): qty 1

## 2018-07-28 NOTE — Progress Notes (Signed)
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     PT Comments    Pt continues to require +2 assist for safe mobility. She remains at high risk for falls. Husband was present during session. He reports she was having falls at the memory care unit but, at times, she was able to ambulate without physical assistance. Pt goes back and forth between being pleasant vs agitation. She was not combative. Multimodal cueing and husband encouragement required for pt participation. Discussed d/c plan with husband-he was under the impression that pt would return to her memory care facility. Explained to husband that returning to prior residence facility was an option IF staff at facility can manage pt's current level of care. Would recommend CSW discussion with family and memory care facility to help facilitate d/c plan. Will continue to follow during hospital stay.  Per Dr. Richardean ChimeraHongalgi's request, attempted to contact a Dr. Logan BoresEvans who had requested to speak with PT after treatment session-there was no answer.     Follow Up Recommendations  SNF(unless memory care facility can provide current level of care); 24 hour supervision/assist     Equipment Recommendations  None recommended by PT    Recommendations for Other Services       Precautions / Restrictions Precautions Precautions: Fall Restrictions Weight Bearing Restrictions: No    Mobility  Bed Mobility Overal bed mobility: Needs Assistance Bed Mobility: Supine to Sit;Sit to Supine     Supine to sit: Mod assist;+2 for physical assistance;+2 for safety/equipment Sit to supine: Mod assist;+2 for physical assistance;+2 for safety/equipment   General bed  mobility comments: Assist for trunk and bil LEs. Utilized bedpad for scooting, positioning. Pt resistant at times but not combative. Multimodal cueing required.   Transfers Overall transfer level: Needs assistance Equipment used: 2 person hand held assist Transfers: Sit to/from Stand Sit to Stand: Mod assist;+2 physical assistance;+2 safety/equipment         General transfer comment: heavy posterior lean initially-some due to pt resistance. Assist to rise, stabilize, control desent. External assist/guidance required for compliance  Ambulation/Gait Ambulation/Gait assistance: Mod assist;+2 safety/equipment;+2 physical assistance Gait Distance (Feet): 75 Feet Assistive device: 2 person hand held assist Gait Pattern/deviations: Step-through pattern;Decreased stride length;Narrow base of support;Decreased step length - left;Decreased step length - right     General Gait Details: High risk for falls. Posterior bias. Very unsteady with narrow BOS/intermittent scissoring. Husband in front of pt to help with encouragement.   Stairs             Wheelchair Mobility    Modified Rankin (Stroke Patients Only)       Balance Overall balance assessment: Needs assistance     Sitting balance - Comments: Varied performance level. Feel some of posterior leaning is due to pt resistance. She was able to sit unsupported for several brief periods.   Standing balance support: Bilateral upper extremity supported Standing balance-Leahy Scale: Poor                              Cognition Arousal/Alertness: Awake/alert Behavior During Therapy: (pleasant at first, then agitated) Overall Cognitive Status: History of cognitive impairments - at baseline  General Comments: pt does not follow commands without physical assistance given to encourage/initiate movement      Exercises      General Comments        Pertinent Vitals/Pain  Pain Assessment: Faces Faces Pain Scale: No hurt    Home Living                      Prior Function            PT Goals (current goals can now be found in the care plan section) Progress towards PT goals: Progressing toward goals    Frequency    Min 2X/week      PT Plan Current plan remains appropriate    Co-evaluation              AM-PAC PT "6 Clicks" Mobility   Outcome Measure  Help needed turning from your back to your side while in a flat bed without using bedrails?: A Lot Help needed moving from lying on your back to sitting on the side of a flat bed without using bedrails?: A Lot Help needed moving to and from a bed to a chair (including a wheelchair)?: A Lot Help needed standing up from a chair using your arms (e.g., wheelchair or bedside chair)?: A Lot Help needed to walk in hospital room?: A Lot Help needed climbing 3-5 steps with a railing? : Total 6 Click Score: 11    End of Session Equipment Utilized During Treatment: Gait belt Activity Tolerance: Patient tolerated treatment well Patient left: in bed;with call bell/phone within reach;with bed alarm set;with family/visitor present   PT Visit Diagnosis: Muscle weakness (generalized) (M62.81);Difficulty in walking, not elsewhere classified (R26.2);Unsteadiness on feet (R26.81)     Time: 1050-1105 PT Time Calculation (min) (ACUTE ONLY): 15 min  Charges:  $Gait Training: 8-22 mins                        Rebeca Alert, PT Acute Rehabilitation Services Pager: [phone] Office: [phone]

## 2018-07-28 NOTE — Progress Notes (Addendum)
CSW informed by HTA insurance that SNF request has been denied even following additional PT note.   Informed patient's husband, Toni Sullivan, of denial. He is agreeable to [facility] and [facility].  Called [facility] to coordinate return and was told by staff that they will need to re-assess patient before approving her to return. No one will be able to re-assess patient until Monday at earliest. MD aware.  CSW continuing to follow for d/c coordination.   Enid CutterLindsey Soloman Mckeithan, MSW, LCSWA Clinical Social Work [phone]

## 2018-07-28 NOTE — Progress Notes (Signed)
PROGRESS NOTE   Toni Sullivan  ZOX:[id number]    DOB: 12-02-1950    DOA: 07/22/2018  PCP: Excell Seltzer, MD   I have briefly reviewed patients previous medical records in [facility].  Brief Narrative:  67 year old married female, resident of memory care unit/[facility], ambulated without assistance and conversational at baseline PTA, PMH of advanced dementia with agitation/aggressive behavior, CVA without residual deficits,?  Dementia/anxiety, presented to [facility] long ED on 08/21/2018 with high fever of 102 F, AMS, agitation and vomiting.  She was admitted to ICU for septic shock presumably due to UTI source, acute on chronic encephalopathy due to sepsis, suspected new onset A. fib.  CCM was consulted.  She required vasopressors which were then weaned off.  Cardiology was consulted and did not feel that she had A. Fib.  Assessment and plan:  1. Septic shock due to presumed UTI: Had to be admitted to ICU.  Treated aggressively per sepsis protocol with IV fluids.  Briefly required vasopressors and steroids, now both weaned off.  Empirically treated with IV ceftriaxone initially.  Blood cultures x2: No growth, urine culture showed insignificant growth and MRSA PCR was negative.  However given strong suspicion for UTI, patient was then transitioned to [facility] to complete total 7 days course.  Sepsis resolved. 2. Acute toxic metabolic encephalopathy: Likely due to septic shock complicating underlying advanced dementia.  At baseline patient reportedly is agitated/aggressive and is on Depakote.  Agitation seems to have improved.  Mental status close to baseline as per my discussion with spouse on 11/29. 3. Advanced dementia: As discussed above.  Continue Prolixin, risperidone, Exelon, Zoloft, as needed Ativan, Remeron and Depakote.  As per report, at her memory care unit patient was ambulating without assistance.  Currently quite weak requiring 2 person assist and able to ambulate 80 feet.   SNF explored but declined by her insurance.  I did peer to peer discussion with her insurance MD on 12/29 who requested PT re-eval and PT to discuss with him.  PT follow-up today appreciated >consider return to prior memory care unit if they can manage her level of care.  Clinical social work to follow-up.  4. Sinus bradycardia: Evaluated by cardiology.  Asymptomatic.  Not a candidate for pacemaker.  She is not on medications that would contribute to this.  TTE: LVEF 65-70%.  Stable.  DC telemetry. 5. Pulmonary edema/acute diastolic CHF: May have been due to aggressive volume resuscitation for septic shock.  Required IV Lasix.  Currently appears euvolemic.  Now transitioned to Lasix 20 mg daily. 6. Hypokalemia: Replaced.  Magnesium 1.7. 7. Normocytic anemia: Could have been due to acute illness/sepsis.  No bleeding reported.  Improved.  Periodically follow CBCs as outpatient. 8. Transient thrombocytopenia: Likely due to sepsis.  Resolved. 9. Prior history of CVA: No acute issues.    DVT prophylaxis: Lovenox Code Status: DNR Family Communication: None at bedside.  I discussed with spouse in detail on 12/29. Disposition: To be determined pending social work input.   Consultants:  CCM Cardiology  Procedures:  None  Antimicrobials:  Rocephin-discontinued Omnicef   Subjective: Alert, tracks activity around her.  Intermittently yells out, incomprehensible- baseline as per discussion with spouse yesterday.  No other acute events reported by nursing.  ROS: As above, otherwise unable due to mental status changes.  Objective:  Vitals:   07/27/18 0903 07/27/18 1401 07/27/18 2129 07/28/18 0531  BP: 119/79 107/82 (!) 150/115 137/83  Pulse: (!) 58 98 62 (!) 52  Resp: 18 (!) 22 16 16   Temp: 98.1 F (36.7 C) (!) 97.4 F (36.3 C) 99.1 F (37.3 C) 98.3 F (36.8 C)  TempSrc: Oral  Oral Oral  SpO2: 96% 99% 100% 98%  Weight:      Height:        Examination:  General exam: Elderly  female, moderately built and thinly nourished, frail, lying comfortably supine in bed. Respiratory system: Clear to auscultation. Respiratory effort normal. Cardiovascular system: S1 & S2 heard, RRR. No JVD, murmurs, rubs, gallops or clicks. No pedal edema.  Telemetry personally reviewed: SB in the 50s-SR. Gastrointestinal system: Abdomen is nondistended, soft and nontender. No organomegaly or masses felt. Normal bowel sounds heard. Central nervous system: Mental status as above.. No focal neurological deficits. Extremities: Symmetric 5 x 5 power. Skin: No rashes, lesions or ulcers Psychiatry: Judgement and insight impaired. Mood & affect cannot be assessed.    Data Reviewed: I have personally reviewed following labs and imaging studies  CBC: Recent Labs  Lab 07/22/18 0336 07/23/18 0401 07/23/18 0630 07/27/18 0738  WBC 8.6 5.3 5.2 7.1  NEUTROABS 6.9 4.0  --   --   HGB 13.1 8.9* 9.2* 11.3*  HCT 40.9 27.9* 28.6* 35.2*  MCV 84.9 88.0 85.1 86.3  PLT 154 109* 112* 210   Basic Metabolic Panel: Recent Labs  Lab 07/22/18 0336 07/23/18 0401 07/24/18 0319 07/25/18 0320 07/26/18 0329  NA 141 140 144 144 143  K 4.5 3.4* 3.6 2.9* 3.7  CL 106 114* 118* 109 109  CO2 24 18* 19* 26 28  GLUCOSE 141* 146* 126* 120* 88  BUN 19 17 22 23 19   CREATININE 0.85 0.68 0.67 0.76 0.68  CALCIUM 8.9 8.3* 8.5* 8.3* 8.5*  MG  --  1.7  --   --   --   PHOS  --  1.6*  --   --   --    Liver Function Tests: Recent Labs  Lab 07/22/18 0336 07/23/18 0401  AST 36 36  ALT 17 30  ALKPHOS 78 40  BILITOT 0.5 0.5  PROT 6.8 5.5*  ALBUMIN 3.6 3.6   CBG: Recent Labs  Lab 07/27/18 2007 07/28/18 0016 07/28/18 0346 07/28/18 0756 07/28/18 1204  GLUCAP 89 74 77 80 121*    Recent Results (from the past 240 hour(s))  Blood Culture (routine x 2)     Status: None   Collection Time: 07/22/18  3:36 AM  Result Value Ref Range Status   Specimen Description   Final    BLOOD LEFT WRIST Performed at [facility], [address] [place], [place] [address]    Special Requests   Final    BOTTLES DRAWN AEROBIC ONLY Blood Culture results may not be optimal due to an inadequate volume of blood received in culture bottles Performed at [facility], [address] [place], [place] [address]    Culture   Final    NO GROWTH 5 DAYS Performed at [facility], [address] [place], [place] [address]    Report Status 07/27/2018 FINAL  Final  Blood Culture (routine x 2)     Status: None   Collection Time: 07/22/18  6:54 AM  Result Value Ref Range Status   Specimen Description   Final    BLOOD LEFT HAND BOTTLES DRAWN AEROBIC ONLY Blood Culture adequate volume Performed at [facility], [address] [place], [place] [address]    Special Requests  Final    NONE Performed at [facility], [address], [place], [place] [address]    Culture   Final    NO GROWTH 5 DAYS Performed at [facility], [address], [place], [place] [address]    Report Status 07/27/2018 FINAL  Final  Urine culture     Status: Abnormal   Collection Time: 07/22/18  6:54 AM  Result Value Ref Range Status   Specimen Description   Final    URINE, RANDOM Performed at [facility], [address] [place], [place] [address]    Special Requests   Final    NONE Performed at [facility], [address], [place], [place] [address]    Culture (A)  Final    <10,000 COLONIES/mL INSIGNIFICANT GROWTH Performed at [facility], [address] [place], [place] [address]    Report Status 07/24/2018 FINAL  Final  MRSA PCR Screening     Status: None   Collection Time: 07/22/18  7:22 AM  Result Value Ref Range Status   MRSA by PCR NEGATIVE NEGATIVE Final    Comment:        The GeneXpert MRSA Assay (FDA approved for NASAL specimens only), is one component of a comprehensive MRSA  colonization surveillance program. It is not intended to diagnose MRSA infection nor to guide or monitor treatment for MRSA infections. Performed at [facility], [address] [place], [place] [address]          Radiology Studies: No results found.      Scheduled Meds: . cefdinir  300 mg Oral BID  . divalproex  250 mg Oral TID  . enoxaparin (LOVENOX) injection  40 mg Subcutaneous Q24H  . famotidine  20 mg Oral BID  . mirtazapine  15 mg Oral QHS  . sertraline  75 mg Oral Daily   Continuous Infusions: . albumin human    . sodium chloride       LOS: 6 days     Marcellus Scott, MD, FACP, [facility]. [facility] Pager [phone]  If 7PM-7AM, please contact night-coverage [email or web address] Password [email or web address] 07/28/2018, 12:06 PM

## 2018-07-29 LAB — GLUCOSE, CAPILLARY
GLUCOSE-CAPILLARY: 89 mg/dL (ref 70–99)
Glucose-Capillary: 83 mg/dL (ref 70–99)
Glucose-Capillary: 127 mg/dL — ABNORMAL HIGH (ref 70–99)
Glucose-Capillary: 110 mg/dL — ABNORMAL HIGH (ref 70–99)

## 2018-07-29 NOTE — Progress Notes (Signed)
PROGRESS NOTE   Toni Sullivan  ZOX:[id number]    DOB: Sep 23, 1950    DOA: 07/22/2018  PCP: Jinny Sanders, MD   I have briefly reviewed patients previous medical records in [facility].  Brief Narrative:  67 year old married female, resident of memory care unit/[facility], ambulated without assistance and conversational at baseline PTA, PMH of advanced dementia with agitation/aggressive behavior, CVA without residual deficits,?  Dementia/anxiety, presented to [facility] long ED on 08/21/2018 with high fever of 102 F, AMS, agitation and vomiting.  She was admitted to ICU for septic shock presumably due to UTI source, acute on chronic encephalopathy due to sepsis, suspected new onset A. fib.  CCM was consulted.  She required vasopressors which were then weaned off.  Cardiology was consulted and did not feel that she had A. Fib.  Assessment and plan:  1. Septic shock due to presumed UTI: Had to be admitted to ICU.  Treated aggressively per sepsis protocol with IV fluids.  Briefly required vasopressors and steroids, now both weaned off.  Empirically treated with IV ceftriaxone initially.  Blood cultures x2: No growth, urine culture showed insignificant growth and MRSA PCR was negative.  However given strong suspicion for UTI, patient was then transitioned to [facility].  Has completed 8 days of antibiotics, discontinued antibiotics after 11/30 dose.  Sepsis resolved. 2. Acute toxic metabolic encephalopathy: Likely due to septic shock complicating underlying advanced dementia.  At baseline patient reportedly is agitated/aggressive and is on Depakote.  Agitation seems to have improved.  As discussed with spouse at bedside on 12/1, mental status back to baseline. 3. Advanced dementia: As discussed above.  Continue Prolixin, risperidone, Exelon, Zoloft, as needed Ativan, Remeron and Depakote.  As per report, at her memory care unit patient was ambulating without assistance.  Currently quite weak  requiring 2 person assist and able to ambulate 80 feet.  SNF explored but declined by her insurance.  At this time awaiting patient's prior memory care unit to reassess in a.m. to determine if they will be able to take her back.  Clinical social work to follow-up.  4. Sinus bradycardia: Evaluated by cardiology.  Asymptomatic.  Not a candidate for pacemaker.  She is not on medications that would contribute to this.  TTE: LVEF 65-70%.  Stable.  DC telemetry. 5. Pulmonary edema/acute diastolic CHF: May have been due to aggressive volume resuscitation for septic shock.  Required IV Lasix.  Currently appears euvolemic.  Now transitioned to Lasix 20 mg daily.  Follow BMP in a.m. 6. Hypokalemia: Replaced.  Magnesium 1.7. 7. Normocytic anemia: Could have been due to acute illness/sepsis.  No bleeding reported.  Improved.  Periodically follow CBCs as outpatient. 8. Transient thrombocytopenia: Likely due to sepsis.  Resolved. 9. Prior history of CVA: No acute issues.    DVT prophylaxis: Lovenox Code Status: DNR Family Communication: Discussed in detail with patient's spouse at bedside, updated care and answered questions. Disposition: To be determined pending evaluation by patient's prior memory care unit, in a.m.   Consultants:  CCM Cardiology  Procedures:  None  Antimicrobials:  Rocephin-discontinued Omnicef   Subjective: Met patient with spouse at bedside.  Has finished eating breakfast this morning.  He reports that her mental status is back to baseline.  No acute issues reported by spouse or nursing.  ROS: As above, otherwise unable due to mental status changes.  Objective:  Vitals:   07/28/18 1423 07/28/18 2125 07/29/18 0447 07/29/18 1500  BP: 138/81 129/88 121/73 123/66  Pulse:  64 61 61 62  Resp: '20 15 19 18  '$ Temp: 98 F (36.7 C) 97.9 F (36.6 C) 98.1 F (36.7 C) 98.4 F (36.9 C)  TempSrc: Oral Oral Oral Oral  SpO2:  98% 99% 97%  Weight:      Height:         Examination: No significant change in clinical exam over the last couple days.  General exam: Elderly female, moderately built and thinly nourished, frail, lying comfortably supine in bed. Respiratory system: Clear to auscultation. Respiratory effort normal. Cardiovascular system: S1 & S2 heard, RRR. No JVD, murmurs, rubs, gallops or clicks. No pedal edema.  Telemetry personally reviewed: SR-SB in the 53s.  Discontinue telemetry. Gastrointestinal system: Abdomen is nondistended, soft and nontender. No organomegaly or masses felt. Normal bowel sounds heard. Central nervous system: Seems alert but eyes closed.  Intermittently yells out but less than yesterday. No focal neurological deficits. Extremities: Symmetric 5 x 5 power. Skin: No rashes, lesions or ulcers Psychiatry: Judgement and insight impaired. Mood & affect cannot be assessed.    Data Reviewed: I have personally reviewed following labs and imaging studies  CBC: Recent Labs  Lab 07/23/18 0401 07/23/18 0630 07/27/18 0738  WBC 5.3 5.2 7.1  NEUTROABS 4.0  --   --   HGB 8.9* 9.2* 11.3*  HCT 27.9* 28.6* 35.2*  MCV 88.0 85.1 86.3  PLT 109* 112* 237   Basic Metabolic Panel: Recent Labs  Lab 07/23/18 0401 07/24/18 0319 07/25/18 0320 07/26/18 0329  NA 140 144 144 143  K 3.4* 3.6 2.9* 3.7  CL 114* 118* 109 109  CO2 18* 19* 26 28  GLUCOSE 146* 126* 120* 88  BUN '17 22 23 19  '$ CREATININE 0.68 0.67 0.76 0.68  CALCIUM 8.3* 8.5* 8.3* 8.5*  MG 1.7  --   --   --   PHOS 1.6*  --   --   --    Liver Function Tests: Recent Labs  Lab 07/23/18 0401  AST 36  ALT 30  ALKPHOS 40  BILITOT 0.5  PROT 5.5*  ALBUMIN 3.6   CBG: Recent Labs  Lab 07/28/18 2128 07/28/18 2347 07/29/18 0451 07/29/18 0804 07/29/18 1638  GLUCAP 80 105* 89 83 127*    Recent Results (from the past 240 hour(s))  Blood Culture (routine x 2)     Status: None   Collection Time: 07/22/18  3:36 AM  Result Value Ref Range Status   Specimen  Description   Final    BLOOD LEFT WRIST Performed at [facility] [address], [place], [place] [address]    Special Requests   Final    BOTTLES DRAWN AEROBIC ONLY Blood Culture results may not be optimal due to an inadequate volume of blood received in culture bottles Performed at [facility] [address] [place], [place] [address]    Culture   Final    NO GROWTH 5 DAYS Performed at [facility], [place] [address], [place], [place] [address]    Report Status 07/27/2018 FINAL  Final  Blood Culture (routine x 2)     Status: None   Collection Time: 07/22/18  6:54 AM  Result Value Ref Range Status   Specimen Description   Final    BLOOD LEFT HAND BOTTLES DRAWN AEROBIC ONLY Blood Culture adequate volume Performed at [facility] [address], [place], [place] [address]    Special Requests   Final    NONE Performed  at [facility], [place] [address], [place], [place] [address]    Culture   Final    NO GROWTH 5 DAYS Performed at [facility], [place] [address], [place], [place] [address]    Report Status 07/27/2018 FINAL  Final  Urine culture     Status: Abnormal   Collection Time: 07/22/18  6:54 AM  Result Value Ref Range Status   Specimen Description   Final    URINE, RANDOM Performed at [place] [address], [place], [place] [address]    Special Requests   Final    NONE Performed at [facility], [place] [address] [place], [place] [address]    Culture (A)  Final    <10,000 COLONIES/mL INSIGNIFICANT GROWTH Performed at [facility] [address], [place], [place] [address]    Report Status 07/24/2018 FINAL  Final  MRSA PCR Screening     Status: None   Collection Time: 07/22/18  7:22 AM  Result Value Ref Range Status   MRSA by PCR NEGATIVE NEGATIVE Final    Comment:        The GeneXpert MRSA Assay (FDA approved for NASAL  specimens only), is one component of a comprehensive MRSA colonization surveillance program. It is not intended to diagnose MRSA infection nor to guide or monitor treatment for MRSA infections. Performed at [facility], [place] [address], [place], [place] [address]          Radiology Studies: No results found.      Scheduled Meds: . divalproex  250 mg Oral TID  . enoxaparin (LOVENOX) injection  40 mg Subcutaneous Q24H  . famotidine  20 mg Oral BID  . fluPHENAZine  1 mg Oral QHS  . Melatonin  3 mg Oral QHS  . mirtazapine  15 mg Oral QHS  . rivastigmine  13.3 mg Transdermal Daily  . sertraline  75 mg Oral Daily   Continuous Infusions:    LOS: 7 days     Vernell Leep, MD, FACP, [facility]. [facility] Pager [phone]  If 7PM-7AM, please contact night-coverage [email or web address] Password [email or web address] 07/29/2018, 4:42 PM

## 2018-07-30 LAB — CBC
HCT: 39.1 % (ref 36.0–46.0)
Hemoglobin: 12.3 g/dL (ref 12.0–15.0)
MCH: 27.5 pg (ref 26.0–34.0)
MCHC: 31.5 g/dL (ref 30.0–36.0)
MCV: 87.3 fL (ref 80.0–100.0)
PLATELETS: 263 10*3/uL (ref 150–400)
RBC: 4.48 MIL/uL (ref 3.87–5.11)
RDW: 14.6 % (ref 11.5–15.5)
WBC: 8.2 10*3/uL (ref 4.0–10.5)
nRBC: 0 % (ref 0.0–0.2)

## 2018-07-30 LAB — BASIC METABOLIC PANEL
Anion gap: 11 (ref 5–15)
BUN: 25 mg/dL — AB (ref 8–23)
CO2: 23 mmol/L (ref 22–32)
Calcium: 9.1 mg/dL (ref 8.9–10.3)
Chloride: 108 mmol/L (ref 98–111)
Creatinine, Ser: 0.86 mg/dL (ref 0.44–1.00)
GFR calc Af Amer: >60 mL/min (ref >60)
GFR calc non Af Amer: >60 mL/min (ref >60)
GLUCOSE: 86 mg/dL (ref 70–99)
Potassium: 4.6 mmol/L (ref 3.5–5.1)
SODIUM: 142 mmol/L (ref 135–145)

## 2018-07-30 LAB — GLUCOSE, CAPILLARY: GLUCOSE-CAPILLARY: 121 mg/dL — AB (ref 70–99)

## 2018-07-30 NOTE — Progress Notes (Signed)
PROGRESS NOTE   Toni Sullivan  MCN:[id number]    DOB: [birth year]    DOA: 07/22/2018  PCP: Jinny Sanders, MD   I have briefly reviewed patients previous medical records in [facility].  Brief Narrative:  67 year old married female, resident of memory care unit/[facility], ambulated without assistance and conversational at baseline PTA, PMH of advanced dementia with agitation/aggressive behavior, CVA without residual deficits,?  Dementia/anxiety, presented to [facility] long ED on 08/21/2018 with high fever of 102 F, AMS, agitation and vomiting.  She was admitted to ICU for septic shock presumably due to UTI source, acute on chronic encephalopathy due to sepsis, suspected new onset A. fib.  CCM was consulted.  She required vasopressors which were then weaned off.  Cardiology was consulted and did not feel that she had A. Fib.  Assessment and plan:  1. Septic shock due to presumed UTI: Had to be admitted to ICU.  Treated aggressively per sepsis protocol with IV fluids.  Briefly required vasopressors and steroids, now both weaned off.  Empirically treated with IV ceftriaxone initially.  Blood cultures x2: No growth, urine culture showed insignificant growth and MRSA PCR was negative.  However given strong suspicion for UTI, patient was then transitioned to [facility].  Has completed 8 days of antibiotics, discontinued antibiotics after 11/30 dose.  Sepsis resolved. 2. Acute toxic metabolic encephalopathy: Likely due to septic shock complicating underlying advanced dementia.  At baseline patient reportedly is agitated/aggressive and is on Depakote.  Agitation seems to have improved.  As discussed with spouse at bedside on 12/1 and 12/2, mental status back to baseline. 3. Advanced dementia: As discussed above.  Continue Prolixin, risperidone, Exelon, Zoloft, as needed Ativan, Remeron and Depakote.  As per report, at her memory care unit patient was ambulating without assistance.  Currently quite weak  requiring 2 person assist and able to ambulate 80 feet.  SNF explored but declined by her insurance.  [facility] personnel evaluated patient today in the hospital and can take patient back on 07/31/2018. 4. Sinus bradycardia: Evaluated by cardiology.  Asymptomatic.  Not a candidate for pacemaker.  She is not on medications that would contribute to this.  TTE: LVEF 65-70%.  Stable.  DC telemetry. 5. Pulmonary edema/acute diastolic CHF: May have been due to aggressive volume resuscitation for septic shock.  Required IV Lasix.  Currently appears euvolemic.  Now transitioned to Lasix 20 mg daily.  BMP okay. 6. Hypokalemia: Replaced.  Magnesium 1.7. 7. Normocytic anemia: Could have been due to acute illness/sepsis.  No bleeding reported.  Improved.  Periodically follow CBCs as outpatient. 8. Transient thrombocytopenia: Likely due to sepsis.  Resolved. 9. Prior history of CVA: No acute issues.  I suspect the markedly elevated BP and low O2 saturation noted below are erroneous, requested RN to recheck.  DVT prophylaxis: Lovenox Code Status: DNR Family Communication: Discussed in detail with patient's spouse at bedside, updated care and answered questions.  Advise no changes and awaiting ALF input this morning. Disposition: DC to [facility] [place] on 12/3.   Consultants:  CCM Cardiology  Procedures:  None  Antimicrobials:  Rocephin-discontinued Omnicef   Subjective: Met patient with spouse at bedside this morning.  He was feeding her.  She has great appetite and ate well.  Mental status at baseline.  No new complaints or concerns at this time.  ROS: As above, otherwise unable due to mental status changes.  Objective:  Vitals:   07/29/18 1500 07/29/18 2016 07/30/18 0449 07/30/18  1352  BP: 123/66 125/64 97/62 (!) 138/123  Pulse: 62 67 (!) 57 87  Resp: _0 Temp: 98.4 F (36.9 C) 99 F (37.2 C) 98.6 F (37 C) 98.5 F (36.9 C)  TempSrc: Oral     SpO2: 97% 99% 97%  (!) 82%  Weight:      Height:        Examination: No significant change in clinical exam over the last several days.  General exam: Elderly female, moderately built and thinly nourished, frail, lying comfortably supine in bed. Respiratory system: Clear to auscultation. Respiratory effort normal. Cardiovascular system: S1 & S2 heard, RRR. No JVD, murmurs, rubs, gallops or clicks. No pedal edema.  Telemetry personally reviewed: SR-SB in the 4s.  Discontinue telemetry. Gastrointestinal system: Abdomen is nondistended, soft and nontender. No organomegaly or masses felt. Normal bowel sounds heard. Central nervous system: Seems alert but eyes closed.  Intermittently yells out but less than yesterday. No focal neurological deficits. Extremities: Symmetric 5 x 5 power. Skin: No rashes, lesions or ulcers Psychiatry: Judgement and insight impaired. Mood & affect cannot be assessed.    Data Reviewed: I have personally reviewed following labs and imaging studies  CBC: Recent Labs  Lab 07/27/18 0738 07/30/18 0604  WBC 7.1 8.2  HGB 11.3* 12.3  HCT 35.2* 39.1  MCV 86.3 87.3  PLT 210 882   Basic Metabolic Panel: Recent Labs  Lab 07/24/18 0319 07/25/18 0320 07/26/18 0329 07/30/18 0604  NA 144 144 143 142  K 3.6 2.9* 3.7 4.6  CL 118* 109 109 108  CO2 19* _1 GLUCOSE 126* 120* 88 86  BUN _2 25*  CREATININE 0.67 0.76 0.68 0.86  CALCIUM 8.5* 8.3* 8.5* 9.1   Liver Function Tests: No results for input(s): AST, ALT, ALKPHOS, BILITOT, PROT, ALBUMIN in the last 168 hours. CBG: Recent Labs  Lab 07/29/18 0451 07/29/18 0804 07/29/18 1207 07/29/18 1638 07/29/18 2020  GLUCAP 89 83 121* 127* 110*    Recent Results (from the past 240 hour(s))  Blood Culture (routine x 2)     Status: None   Collection Time: 07/22/18  3:36 AM  Result Value Ref Range Status   Specimen Description   Final    BLOOD LEFT WRIST Performed at [place] [facility] [address],  [place], [place] [address]    Special Requests   Final    BOTTLES DRAWN AEROBIC ONLY Blood Culture results may not be optimal due to an inadequate volume of blood received in culture bottles Performed at [place] [address], [place], [place] [address]    Culture   Final    NO GROWTH 5 DAYS Performed at [facility], [place] [address], [place], [place] [address]    Report Status 07/27/2018 FINAL  Final  Blood Culture (routine x 2)     Status: None   Collection Time: 07/22/18  6:54 AM  Result Value Ref Range Status   Specimen Description   Final    BLOOD LEFT HAND BOTTLES DRAWN AEROBIC ONLY Blood Culture adequate volume Performed at [place] [address], [place], [place] [address]    Special Requests   Final    NONE Performed at [facility], [place] [address] [place], [place] [address]    Culture   Final    NO GROWTH 5 DAYS Performed at [facility], [place] [address] [place], [place] [address]  Report Status 07/27/2018 FINAL  Final  Urine culture     Status: Abnormal   Collection Time: 07/22/18  6:54 AM  Result Value Ref Range Status   Specimen Description   Final    URINE, RANDOM Performed at [facility] [address], [place], [place] [address]    Special Requests   Final    NONE Performed at [facility], [place] [facility] [place] [address] [place], [place] [address]    Culture (A)  Final    <10,000 COLONIES/mL INSIGNIFICANT GROWTH Performed at [place] [address] [place], [place] [address]    Report Status 07/24/2018 FINAL  Final  MRSA PCR Screening     Status: None   Collection Time: 07/22/18  7:22 AM  Result Value Ref Range Status   MRSA by PCR NEGATIVE NEGATIVE Final    Comment:        The GeneXpert MRSA Assay (FDA approved for NASAL specimens only), is one component of a comprehensive MRSA colonization surveillance program. It is not intended to  diagnose MRSA infection nor to guide or monitor treatment for MRSA infections. Performed at [facility], [place] [address], [place], [place] [address]          Radiology Studies: No results found.      Scheduled Meds: . divalproex  250 mg Oral TID  . enoxaparin (LOVENOX) injection  40 mg Subcutaneous Q24H  . famotidine  20 mg Oral BID  . fluPHENAZine  1 mg Oral QHS  . Melatonin  3 mg Oral QHS  . mirtazapine  15 mg Oral QHS  . rivastigmine  13.3 mg Transdermal Daily  . sertraline  75 mg Oral Daily   Continuous Infusions:    LOS: 8 days     Vernell Leep, MD, FACP, [facility]. [facility] Pager [phone]  If 7PM-7AM, please contact night-coverage [email or web address] Password [facility] 07/30/2018, 6:11 PM

## 2018-07-30 NOTE — Care Management Note (Signed)
Case Management Note  Patient Details  Name: Toni Sullivan MRN: [id number] Date of Birth: 03/26/1951  Subjective/Objective:                  Discharge planning  Action/Plan: To [facility]. Spoke to the husband patient resides in the memory care/dementia unit and they handle all of the her including giving her her meds.  Will see if [facility].    Expected Discharge Date:  07/26/18               Expected Discharge Plan:  Assisted Living / Rest Home  In-House Referral:  Clinical Social Work  Discharge planning Services  CM Consult  Post Acute Care Choice:  Home Health Choice offered to:  Adult Children  DME Arranged:    DME Agency:     HH Arranged:    HH Agency:     Status of Service:  In process, will continue to follow  If discussed at Long Length of Stay Meetings, dates discussed:    Additional Comments:  [name],  Lynn, RN 07/30/2018, 10:09 AM

## 2018-07-30 NOTE — Progress Notes (Signed)
[facility] to assess for read[facility]- [facility] pt able to return. States she spoke with pt's spouse as well. CSW spoke with facility admission RN Deedee, getting pt wheelchair for use at facility and can return 07/31/18.  CSW left voicemail for pt's husband. Will coordinate getting DC information to ALF upon DC, complete FL2 with DC care needs/medications, and arrange transportation.  Ilean SkillMeghan Mister Krahenbuhl, MSW, LCSW Clinical Social Work 07/30/2018 [phone] coverage for [phone]

## 2018-07-30 NOTE — Care Management Important Message (Signed)
Important Message  Patient Details  Name: Toni Sullivan MRN: [id number] Date of Birth: 12/12/1950   Medicare Important Message Given:  Yes    Caren MacadamFuller, Germani Gavilanes 07/30/2018, 10:56 AMImportant Message  Patient Details  Name: Toni Sullivan MRN: [id number] Date of Birth: 11/20/1950   Medicare Important Message Given:  Yes    Caren MacadamFuller, Falon Flinchum 07/30/2018, 10:56 AM

## 2018-07-31 MED ORDER — LORAZEPAM 0.5 MG PO TABS: 0.5000 mg | ORAL_TABLET | Freq: Three times a day (TID) | ORAL | 0 refills | Status: DC | PRN

## 2018-07-31 MED ORDER — FUROSEMIDE 20 MG PO TABS: 20.0000 mg | ORAL_TABLET | ORAL | 0 refills | Status: AC

## 2018-07-31 MED ORDER — ACETAMINOPHEN 500 MG PO TABS: 500.0000 mg | ORAL_TABLET | Freq: Four times a day (QID) | ORAL | Status: DC | PRN

## 2018-07-31 NOTE — Progress Notes (Addendum)
[facility]. Discharge instruction including medication and appointment was in discharge package. Patient's spouse is notified about patient going back to facility as well. Social worker is notified. Rn called for report at 1426. No question at this time.

## 2018-07-31 NOTE — Progress Notes (Addendum)
[facility] is set to [facility]. Spouse, Toni Sullivan, aware. Discharge packet given to RN. [facility] called for transport.   Stacy GardnerErin Perri Lamagna, LCSWA Clinical Social Worker [phone]

## 2018-07-31 NOTE — [place] FL2 (Addendum)
[place] MEDICAID FL2 LEVEL OF CARE SCREENING TOOL     IDENTIFICATION  Patient Name: Toni Sullivan Birthdate: 07/18/1951 Sex: female Admission Date (Current Location): 07/22/2018  [facility] [place]   Facility and Address:  [facility],  [address], [place] [id number]      Provider Number: [id number]  Attending Physician Name and Address:  Elease EtienneHongalgi, Anand D, MD  Relative Name and Phone Number:       Current Level of Care: Hospital Recommended Level of Care: Assisted Living Facility Prior Approval Number:    Date Approved/Denied:   PASRR Number:    Discharge Plan: Assisted living- memory care    Current Diagnoses: Patient Active Problem List   Diagnosis Date Noted  . Sepsis secondary to UTI (HCC) 07/22/2018  . UTI (urinary tract infection) 06/12/2017  . Delirium 06/12/2017  . Aggressive behavior   . Urinary tract infection without hematuria   . Acute metabolic encephalopathy 02/03/2017  . Acute lower UTI 02/03/2017  . 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 History of CVA (cerebrovascular accident) 07/03/2013    Orientation RESPIRATION BLADDER Height & Weight     Self  Normal Incontinent Weight: 145 lb (65.8 kg) Height:  5\' 7"  (170.2 cm)  BEHAVIORAL SYMPTOMS/MOOD NEUROLOGICAL BOWEL NUTRITION STATUS      Incontinent Diet(DYS 1)  AMBULATORY STATUS COMMUNICATION OF NEEDS Skin   Limited Assist Verbally Normal                       Personal Care Assistance Level of Assistance  Bathing, Feeding, Dressing Bathing Assistance: Limited assistance Feeding assistance: Independent Dressing Assistance: Limited assistance     Functional Limitations Info  Sight, Hearing, Speech Sight Info: Adequate Hearing Info: Adequate Speech Info: Adequate    SPECIAL CARE FACTORS FREQUENCY                       Contractures Contractures Info: Not present     Additional Factors Info  Code Status, Allergies Code Status Info: DNR  Allergies Info: PENICILLINS  Psychotropic Info: depakote sprinkle,remeron, zoloft         Current Medications (07/31/2018):  This is the current hospital active medication list Current Facility-Administered Medications  Medication Dose Route Frequency Provider Last Rate Last Dose  . acetaminophen (TYLENOL) suppository 650 mg  650 mg Rectal Q8H PRN Dow AdolphHall, Carole N, DO      . divalproex (DEPAKOTE SPRINKLE) capsule 250 mg  250 mg Oral TID Dow AdolphHall, Carole N, DO   250 mg at 07/31/18 0956  . enoxaparin (LOVENOX) injection 40 mg  40 mg Subcutaneous Q24H Dow AdolphHall, Carole N, DO   40 mg at 07/31/18 0957  . famotidine (PEPCID) tablet 20 mg  20 mg Oral BID Elease EtienneHongalgi, Anand D, MD   20 mg at 07/31/18 0956  . fluPHENAZine (PROLIXIN) tablet 1 mg  1 mg Oral QHS Hongalgi, Maximino GreenlandAnand D, MD   1 mg at 07/30/18 2102  . Melatonin TABS 3 mg  3 mg Oral QHS Hongalgi, Maximino GreenlandAnand D, MD   3 mg at 07/30/18 2102  . mirtazapine (REMERON) tablet 15 mg  15 mg Oral QHS Hall, Carole N, DO   15 mg at 07/30/18 2102  . ondansetron (ZOFRAN) injection 4 mg  4 mg Intravenous Q6H PRN Dow AdolphHall, Carole N, DO      . risperiDONE (RISPERDAL) tablet 0.5 mg  0.5 mg Oral Q6H PRN  Dow Adolph N, DO   0.5 mg at 07/31/18 7829  . rivastigmine (EXELON) 13.3 MG/24HR 13.3 mg  13.3 mg Transdermal Daily Marcellus Scott D, MD   13.3 mg at 07/31/18 0957  . sertraline (ZOLOFT) tablet 75 mg  75 mg Oral Daily Darlin Drop, DO   75 mg at 07/31/18 5621     Discharge Medications: Please see discharge summary for a list of discharge medications.  Relevant Imaging Results:  Relevant Lab Results:   Additional Information SS:[id number];  Donnie Coffin, LCSW

## 2018-07-31 NOTE — Discharge Summary (Signed)
Physician Discharge Summary  LACHERYL NIESEN ZOX:[id number] DOB: 12-19-1950  PCP: Excell Seltzer, MD  Admit date: 07/22/2018 Discharge date: 07/31/2018  Recommendations for Outpatient Follow-up:  1. MD at ALF.  Follow labs (CBC & CMP) periodically, next in 1 week. 2. Dr. [name], PCP 3. Consider repeating urine microscopy in a couple of weeks to follow-up on microscopic hematuria.  Home Health: PT and OT evaluated patient and recommended SNF.  However patient was declined by insurance.  Continue PT and OT evaluation & management at ALF. Equipment/Devices: TBD at ALF.  Discharge Condition: Improved and stable. CODE STATUS: DNR Diet recommendation: Heart healthy diet.  Discharge Diagnoses:  Active Problems:   Sepsis secondary to UTI [facility])   Brief Summary: 67 year old married female, resident of memory care unit/[facility], ambulated without assistance or minimal assistance and and somewhat conversational at baseline PTA, PMH of advanced dementia with agitation/aggressive behavior, CVA without residual deficits, anxiety, presented to [facility] long ED on 08/21/2018 with high fever of 102 F, AMS, agitation and vomiting.  She was admitted to ICU for septic shock presumably due to UTI source, acute on chronic encephalopathy due to sepsis, suspected new onset A. fib.  CCM was consulted.  She required vasopressors which were then weaned off.  Cardiology was consulted and did not feel that she had A. Fib.  Assessment and plan:  1. Septic shock due to presumed UTI: Had to be admitted to ICU.  Treated aggressively per sepsis protocol with IV fluids.  Briefly required vasopressors and steroids, now both weaned off.  Empirically treated with IV ceftriaxone initially.  Blood cultures x2: No growth, urine culture showed insignificant growth and MRSA PCR was negative.  However given strong suspicion for UTI, patient was then transitioned to [facility].    She completed completed 8 days of  antibiotics, discontinued antibiotics after 11/30 dose.  Sepsis resolved. 2. Acute toxic metabolic encephalopathy: Likely due to septic shock complicating underlying advanced dementia.  At baseline patient reportedly is agitated/aggressive and is on Depakote and multiple other medications.  Agitation seems to have improved.  As discussed with spouse at bedside on 12/1 and 12/2, mental status back to baseline. 3. Advanced dementia: As discussed above.  Continue Prolixin, risperidone, Exelon, Zoloft, Remeron and Depakote.  Prior to admission, patient was on scheduled Ativan 0.5 mg every 6 hourly and as needed Ativan gel for agitation.  However this Ativan regimen was not continued  here initially for unclear reasons> maybe d/t worsening AMS.  She has not been on significant Ativan for the last 10 days of hospitalization without adverse outcomes and hence less likelihood for withdrawal seizures.  She has not been overtly agitated.  Thereby changed Ativan to 0.5 mg every 8 hourly as needed for agitation. As per report, at her memory care unit patient was ambulating without assistance.  Currently quite weak requiring 2 person assist and able to ambulate 80 feet.  SNF explored but declined by her insurance.  ALF [facility] personnel evaluated patient in the hospital and can take patient back on 07/31/2018. 4. Sinus bradycardia: Evaluated by cardiology.  Asymptomatic.  Not a candidate for pacemaker.  She is not on medications that would contribute to this.  TTE: LVEF 65-70%.  Stable. 5. Pulmonary edema/acute diastolic CHF: May have been due to aggressive volume resuscitation for septic shock.  Required IV Lasix.  Currently appears euvolemic.  Now transitioned to Lasix 20 milligrams every other day.  Monitor BMP periodically.  Adjust Lasix dose as needed.  6. Hypokalemia: Replaced.  Magnesium 1.7. 7. Normocytic anemia: Could have been due to acute illness/sepsis.  No bleeding reported.  Improved.  Periodically  follow CBCs as outpatient. 8. Transient thrombocytopenia: Likely due to sepsis.  Resolved. 9. Prior history of CVA: No acute issues. 10. Microscopic hematuria: Could have been due to UTI.  Recommend repeating urine microscopy in a couple of weeks to follow-up.    Consultants:  CCM Cardiology  Procedures:  None   Discharge Instructions  Discharge Instructions    Call MD for:   Complete by:  As directed    Worsening confusion or altered mental status.   Call MD for:  difficulty breathing, headache or visual disturbances   Complete by:  As directed    Call MD for:  extreme fatigue   Complete by:  As directed    Call MD for:  persistant dizziness or light-headedness   Complete by:  As directed    Call MD for:  persistant nausea and vomiting   Complete by:  As directed    Call MD for:  severe uncontrolled pain   Complete by:  As directed    Call MD for:  temperature >100.4   Complete by:  As directed    Diet - low sodium heart healthy   Complete by:  As directed    Increase activity slowly   Complete by:  As directed        Medication List    STOP taking these medications   PRESCRIPTION MEDICATION     TAKE these medications   acetaminophen 500 MG tablet Commonly known as:  TYLENOL Take 1 tablet (500 mg total) by mouth every 6 (six) hours as needed for mild pain, moderate pain or fever. What changed:    when to take this  reasons to take this   Cranberry 450 MG Tabs Take 450 mg by mouth daily.   divalproex 125 MG capsule Commonly known as:  DEPAKOTE SPRINKLE Take 250 mg by mouth 3 (three) times daily.   fluPHENAZine 1 MG tablet Commonly known as:  PROLIXIN Take 1 mg by mouth at bedtime.   furosemide 20 MG tablet Commonly known as:  LASIX Take 1 tablet (20 mg total) by mouth every other day.   guaifenesin 100 MG/5ML syrup Commonly known as:  ROBITUSSIN Take 200 mg by mouth every 6 (six) hours as needed for cough.   loperamide 2 MG  capsule Commonly known as:  IMODIUM Take 2 mg by mouth as needed for diarrhea or loose stools. Do not exceed 8 doses in 24 hours   LORazepam 0.5 MG tablet Commonly known as:  ATIVAN Take 1 tablet (0.5 mg total) by mouth every 8 (eight) hours as needed for anxiety. What changed:    when to take this  reasons to take this   magnesium hydroxide 400 MG/5ML suspension Commonly known as:  MILK OF MAGNESIA Take 30 mLs by mouth at bedtime as needed for mild constipation.   Melatonin 3 MG Tabs Take 3 mg by mouth at bedtime.   MINTOX 200-200-20 MG/5ML suspension Generic drug:  alum & mag hydroxide-simeth Take 30 mLs by mouth as needed for indigestion or heartburn. Do not exceed 4 doses in 24 hours   mirtazapine 15 MG tablet Commonly known as:  REMERON Take 15 mg by mouth at bedtime.   NUTRITIONAL DRINK PO Take 236 mLs by mouth 3 (three) times daily. MIGHTY SHAKES   risperiDONE 0.5 MG tablet Commonly known as:  RISPERDAL Take  0.5 mg by mouth every 6 (six) hours as needed (severe agitation).   rivastigmine 13.3 MG/24HR Commonly known as:  EXELON Apply 1 patch (13.3 mg total) topically daily.   sertraline 25 MG tablet Commonly known as:  ZOLOFT Take 75 mg by mouth daily.   TRIPLE ANTIBIOTIC [phone] Oint Apply 1 application topically as needed (minor skin tears or abrasions).      Follow-up Information    MD at ALF. Schedule an appointment as soon as possible for a visit.   Why:  Follow up periodic labs (CBC & CMP), next in 1 week.       Excell Seltzer, MD. Schedule an appointment as soon as possible for a visit.   Specialty:  Family Medicine Contact information: [address] [place] [address] [phone]          Allergies  Allergen Reactions  . Penicillins Other (See Comments)    Has patient had a PCN reaction causing immediate rash, facial/tongue/throat swelling, SOB or lightheadedness with hypotension: Unkown PCN reaction causing severe  rash involving mucus membranes or skin necrosis: Unknown PCN reaction that required hospitalization: Unknown PCN reaction occurring within the last 10 years: No If all of the above answers are "NO", then may proceed with Cephalosporin use. Tolerated Cefepime, Ceftriaxone (06/2018)       Procedures/Studies: Dg Abd 1 View  Result Date: 07/22/2018 CLINICAL DATA:  Vomiting, dementia EXAM: ABDOMEN - 1 VIEW COMPARISON:  Portable exam at 0922 hrs without priors for comparison FINDINGS: Nonobstructive bowel gas pattern. No bowel dilatation or bowel wall thickening. Increased stool in rectum. Small calcification 8 x 4 mm projects over inferior pole of RIGHT kidney. Large calcification in RIGHT upper quadrant, projecting over mid RIGHT kidney, question 3.1 x 2.4 cm diameter calculus at RIGHT renal pelvis versus large densely calcified gallstone. Scattered pelvic phleboliths. Bones demineralized with degenerative disc disease changes at L4-L5. IMPRESSION: RIGHT renal calculi versus less likely cholelithiasis. Increased stool in rectum. Otherwise normal bowel gas pattern Electronically Signed   By: Ulyses Southward M.D.   On: 07/22/2018 10:10   Dg Chest Port 1 View  Result Date: 07/24/2018 CLINICAL DATA:  Dyspnea. EXAM: PORTABLE CHEST 1 VIEW COMPARISON:  Radiograph of July 22, 2018. FINDINGS: Stable cardiomediastinal silhouette. No pneumothorax is noted. New bibasilar opacities are noted concerning for edema or atelectasis with associated pleural effusions. Bony thorax is unremarkable. IMPRESSION: New bibasilar edema or atelectasis is noted with associated pleural effusions. Electronically Signed   By: Lupita Raider, M.D.   On: 07/24/2018 14:23   Dg Chest Port 1 View  Result Date: 07/22/2018 CLINICAL DATA:  Fever.  Sepsis. EXAM: PORTABLE CHEST 1 VIEW COMPARISON:  None. FINDINGS: Patient is rotated. Heart size and pulmonary vascularity are normal. Probable emphysematous changes in the lungs. No airspace  disease or consolidation. No blunting of costophrenic angles. No pneumothorax. Chin mediastinal contours appear intact. IMPRESSION: Emphysematous changes in the lungs. No evidence of active pulmonary disease. Electronically Signed   By: Burman Nieves M.D.   On: 07/22/2018 03:34      Subjective: Patient is nonverbal.  Being fed by nursing tech and has finished eating 100% of her breakfast.  Tracks activity around her with her eyes.  Intermittently yells out.  No other acute issues reported by nursing.  Discharge Exam:  Vitals:   07/30/18 1352 07/30/18 2057 07/31/18 0538 07/31/18 0930  BP: (!) 138/123 107/86 91/71   Pulse: 87 69 (!) 52 62  Resp: 16  18 18   Temp: 98.5 F (36.9 C) 98.4 F (36.9 C) 98.4 F (36.9 C)   TempSrc:  Axillary Axillary   SpO2: (!) 82% 98% 99%   Weight:      Height:        General exam: Elderly female, moderately built and thinly nourished, frail, lying comfortably supine in bed. Respiratory system: Clear to auscultation. Respiratory effort normal. Cardiovascular system: S1 & S2 heard, RRR. No JVD, murmurs, rubs, gallops or clicks. No pedal edema. Gastrointestinal system: Abdomen is nondistended, soft and nontender. No organomegaly or masses felt. Normal bowel sounds heard. Central nervous system:  Alert.  Cannot assess orientation.  Does not follow instructions.  Intermittently yells out. No focal neurological deficits. Extremities: Symmetric 5 x 5 power. Skin: No rashes, lesions or ulcers Psychiatry: Judgement and insight impaired. Mood & affect cannot be assessed.    The results of significant diagnostics from this hospitalization (including imaging, microbiology, ancillary and laboratory) are listed below for reference.     Microbiology: Recent Results (from the past 240 hour(s))  Blood Culture (routine x 2)     Status: None   Collection Time: 07/22/18  3:36 AM  Result Value Ref Range Status   Specimen Description   Final    BLOOD LEFT  WRIST Performed at [facility], [address], [place], [place] [address]    Special Requests   Final    BOTTLES DRAWN AEROBIC ONLY Blood Culture results may not be optimal due to an inadequate volume of blood received in culture bottles Performed at [facility], [address], [place], [place] [address]    Culture   Final    NO GROWTH 5 DAYS Performed at [facility], [address] [place], [place] [address]    Report Status 07/27/2018 FINAL  Final  Blood Culture (routine x 2)     Status: None   Collection Time: 07/22/18  6:54 AM  Result Value Ref Range Status   Specimen Description   Final    BLOOD LEFT HAND BOTTLES DRAWN AEROBIC ONLY Blood Culture adequate volume Performed at [facility], [address], [place], [place] [address]    Special Requests   Final    NONE Performed at [facility], [address] [place], [place] [address]    Culture   Final    NO GROWTH 5 DAYS Performed at [facility], [address], [place], [place] [address]    Report Status 07/27/2018 FINAL  Final  Urine culture     Status: Abnormal   Collection Time: 07/22/18  6:54 AM  Result Value Ref Range Status   Specimen Description   Final    URINE, RANDOM Performed at [facility], [address], [place], [place] [address]    Special Requests   Final    NONE Performed at [facility], [address], [place], [place] [address]    Culture (A)  Final    <10,000 COLONIES/mL INSIGNIFICANT GROWTH Performed at [facility], [address] [place], [place] [address]    Report Status 07/24/2018 FINAL  Final  MRSA PCR Screening     Status: None   Collection Time: 07/22/18  7:22 AM  Result Value Ref Range Status   MRSA by PCR NEGATIVE NEGATIVE Final    Comment:        The GeneXpert MRSA Assay (FDA approved for NASAL specimens only),  is one component of  a comprehensive MRSA colonization surveillance program. It is not intended to diagnose MRSA infection nor to guide or monitor treatment for MRSA infections. Performed at [facility], [address] [place], [place] [address]      Labs: CBC: Recent Labs  Lab 07/27/18 0738 07/30/18 0604  WBC 7.1 8.2  HGB 11.3* 12.3  HCT 35.2* 39.1  MCV 86.3 87.3  PLT 210 263   Basic Metabolic Panel: Recent Labs  Lab 07/25/18 0320 07/26/18 0329 07/30/18 0604  NA 144 143 142  K 2.9* 3.7 4.6  CL 109 109 108  CO2 26 28 23   GLUCOSE 120* 88 86  BUN 23 19 25*  CREATININE 0.76 0.68 0.86  CALCIUM 8.3* 8.5* 9.1   BNP (last 3 results) Recent Labs    07/22/18 0336  BNP 99.8   CBG: Recent Labs  Lab 07/29/18 0451 07/29/18 0804 07/29/18 1207 07/29/18 1638 07/29/18 2020  GLUCAP 89 83 121* 127* 110*   Urinalysis    Component Value Date/Time   COLORURINE YELLOW 07/22/2018 0517   APPEARANCEUR HAZY (A) 07/22/2018 0517   LABSPEC 1.015 07/22/2018 0517   PHURINE 7.0 07/22/2018 0517   GLUCOSEU NEGATIVE 07/22/2018 0517   HGBUR MODERATE (A) 07/22/2018 0517   BILIRUBINUR NEGATIVE 07/22/2018 0517   KETONESUR 5 (A) 07/22/2018 0517   PROTEINUR NEGATIVE 07/22/2018 0517   NITRITE NEGATIVE 07/22/2018 0517   LEUKOCYTESUR LARGE (A) 07/22/2018 0517      Time coordinating discharge: 40 minutes  SIGNED:  Marcellus Scott, MD, FACP, [facility]. [facility] Pager [phone]  If 7PM-7AM, please contact night-coverage [email or web address] Password [email or web address] 07/31/2018, 1:26 PM

## 2018-07-31 NOTE — Progress Notes (Signed)
OT Cancellation Note  Patient Details Name: Toni Sullivan MRN: [id number] DOB: 03/23/1951   Cancelled Treatment:    Reason Eval/Treat Not Completed: Other (comment).  Pt is too agitated  Toni Sullivan 07/31/2018, 12:59 PM  Toni Sullivan, OTR/L Acute Rehabilitation Services [phone] [facility] pager [phone] office 07/31/2018

## 2018-07-31 NOTE — Discharge Instructions (Signed)

## 2018-07-31 NOTE — Progress Notes (Signed)
[facility] today. CSW will continue to follow up

## 2018-08-01 ENCOUNTER — Telehealth: Payer: Self-pay

## 2018-08-01 NOTE — Telephone Encounter (Signed)
Left message for Toni Sullivan to give Toni Sullivan a call back in regards to the patient. [facility] I wanted to confirm that and to see if there will be an MD to follow there and if not need to come in to see Dr Ermalene SearingBedsole.

## 2018-08-02 NOTE — Telephone Encounter (Signed)
Spoke with Mr. Dickey GaveMoricle. Patient is at Assisted Living facility-[facility]. Provider at that facility will be taking care of patient at this time.

## 2018-08-06 DIAGNOSIS — G9341 Metabolic encephalopathy: Secondary | ICD-10-CM | POA: Diagnosis not present

## 2018-08-06 DIAGNOSIS — F0151 Vascular dementia with behavioral disturbance: Secondary | ICD-10-CM | POA: Diagnosis not present

## 2018-08-06 DIAGNOSIS — R54 Age-related physical debility: Secondary | ICD-10-CM | POA: Diagnosis not present

## 2018-08-06 DIAGNOSIS — R296 Repeated falls: Secondary | ICD-10-CM | POA: Diagnosis not present

## 2018-08-06 DIAGNOSIS — Z760 Encounter for issue of repeat prescription: Secondary | ICD-10-CM | POA: Diagnosis not present

## 2018-08-06 DIAGNOSIS — E876 Hypokalemia: Secondary | ICD-10-CM | POA: Diagnosis not present

## 2018-08-06 DIAGNOSIS — A419 Sepsis, unspecified organism: Secondary | ICD-10-CM | POA: Diagnosis not present

## 2018-08-15 DIAGNOSIS — Z79899 Other long term (current) drug therapy: Secondary | ICD-10-CM | POA: Diagnosis not present

## 2018-08-15 DIAGNOSIS — D649 Anemia, unspecified: Secondary | ICD-10-CM | POA: Diagnosis not present

## 2018-08-20 DIAGNOSIS — R296 Repeated falls: Secondary | ICD-10-CM | POA: Diagnosis not present

## 2018-08-20 DIAGNOSIS — F0151 Vascular dementia with behavioral disturbance: Secondary | ICD-10-CM | POA: Diagnosis not present

## 2018-08-20 DIAGNOSIS — R54 Age-related physical debility: Secondary | ICD-10-CM | POA: Diagnosis not present

## 2018-08-20 DIAGNOSIS — G309 Alzheimer's disease, unspecified: Secondary | ICD-10-CM | POA: Diagnosis not present

## 2018-08-20 DIAGNOSIS — M6281 Muscle weakness (generalized): Secondary | ICD-10-CM | POA: Diagnosis not present

## 2018-08-21 DIAGNOSIS — R296 Repeated falls: Secondary | ICD-10-CM | POA: Diagnosis not present

## 2018-08-21 DIAGNOSIS — M6281 Muscle weakness (generalized): Secondary | ICD-10-CM | POA: Diagnosis not present

## 2018-08-21 DIAGNOSIS — R2681 Unsteadiness on feet: Secondary | ICD-10-CM | POA: Diagnosis not present

## 2018-08-29 DIAGNOSIS — R2681 Unsteadiness on feet: Secondary | ICD-10-CM | POA: Diagnosis not present

## 2018-08-29 DIAGNOSIS — R296 Repeated falls: Secondary | ICD-10-CM | POA: Diagnosis not present

## 2018-08-29 DIAGNOSIS — M6281 Muscle weakness (generalized): Secondary | ICD-10-CM | POA: Diagnosis not present

## 2018-10-02 DIAGNOSIS — R2681 Unsteadiness on feet: Secondary | ICD-10-CM | POA: Diagnosis not present

## 2018-10-02 DIAGNOSIS — R296 Repeated falls: Secondary | ICD-10-CM | POA: Diagnosis not present

## 2018-10-02 DIAGNOSIS — M6281 Muscle weakness (generalized): Secondary | ICD-10-CM | POA: Diagnosis not present

## 2018-12-24 DIAGNOSIS — G300 Alzheimer's disease with early onset: Secondary | ICD-10-CM | POA: Diagnosis not present

## 2018-12-24 DIAGNOSIS — F0151 Vascular dementia with behavioral disturbance: Secondary | ICD-10-CM | POA: Diagnosis not present

## 2018-12-24 DIAGNOSIS — R54 Age-related physical debility: Secondary | ICD-10-CM | POA: Diagnosis not present

## 2018-12-24 DIAGNOSIS — E870 Hyperosmolality and hypernatremia: Secondary | ICD-10-CM | POA: Diagnosis not present

## 2018-12-24 DIAGNOSIS — E034 Atrophy of thyroid (acquired): Secondary | ICD-10-CM | POA: Diagnosis not present

## 2018-12-27 DIAGNOSIS — E870 Hyperosmolality and hypernatremia: Secondary | ICD-10-CM | POA: Diagnosis not present

## 2018-12-27 DIAGNOSIS — G300 Alzheimer's disease with early onset: Secondary | ICD-10-CM | POA: Diagnosis not present

## 2018-12-27 DIAGNOSIS — E039 Hypothyroidism, unspecified: Secondary | ICD-10-CM | POA: Diagnosis not present

## 2018-12-27 DIAGNOSIS — D649 Anemia, unspecified: Secondary | ICD-10-CM | POA: Diagnosis not present

## 2018-12-27 DIAGNOSIS — E034 Atrophy of thyroid (acquired): Secondary | ICD-10-CM | POA: Diagnosis not present

## 2019-03-18 DIAGNOSIS — J309 Allergic rhinitis, unspecified: Secondary | ICD-10-CM | POA: Diagnosis not present

## 2019-03-18 DIAGNOSIS — G300 Alzheimer's disease with early onset: Secondary | ICD-10-CM | POA: Diagnosis not present

## 2019-03-18 DIAGNOSIS — R54 Age-related physical debility: Secondary | ICD-10-CM | POA: Diagnosis not present

## 2019-03-18 DIAGNOSIS — F0151 Vascular dementia with behavioral disturbance: Secondary | ICD-10-CM | POA: Diagnosis not present

## 2019-04-01 ENCOUNTER — Other Ambulatory Visit: Payer: Self-pay

## 2019-04-29 DIAGNOSIS — G300 Alzheimer's disease with early onset: Secondary | ICD-10-CM | POA: Diagnosis not present

## 2019-04-29 DIAGNOSIS — R54 Age-related physical debility: Secondary | ICD-10-CM | POA: Diagnosis not present

## 2019-04-29 DIAGNOSIS — F0151 Vascular dementia with behavioral disturbance: Secondary | ICD-10-CM | POA: Diagnosis not present

## 2019-05-20 DIAGNOSIS — G300 Alzheimer's disease with early onset: Secondary | ICD-10-CM | POA: Diagnosis not present

## 2019-05-20 DIAGNOSIS — R296 Repeated falls: Secondary | ICD-10-CM | POA: Diagnosis not present

## 2019-05-20 DIAGNOSIS — R54 Age-related physical debility: Secondary | ICD-10-CM | POA: Diagnosis not present

## 2019-05-20 DIAGNOSIS — F0151 Vascular dementia with behavioral disturbance: Secondary | ICD-10-CM | POA: Diagnosis not present

## 2019-05-28 IMAGING — CT CT CERVICAL SPINE W/O CM
3 of 4 series · 13 of 33 positions shown, 16 images · non-contrast
Comparison: None.

CLINICAL DATA: Initial evaluation for acute trauma, fall.

EXAM:
CT HEAD WITHOUT CONTRAST
CT CERVICAL SPINE WITHOUT CONTRAST
TECHNIQUE: Multidetector CT imaging of the head and cervical spine was
performed following the standard protocol without intravenous
contrast. Multiplanar CT image reconstructions of the cervical spine
were also generated.

[Series 6: coronal · coronal · 0.23mm/px · 3 of 61 slices shown]
[im 14/61  bone]
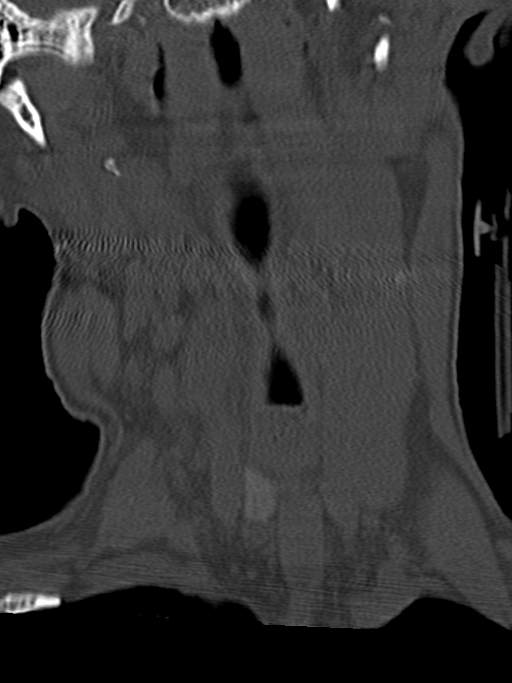
[im 25/61  bone]
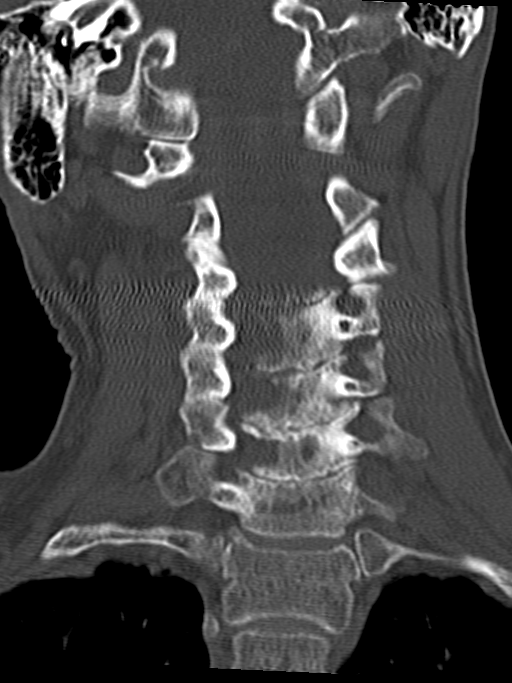
[im 36/61  bone]
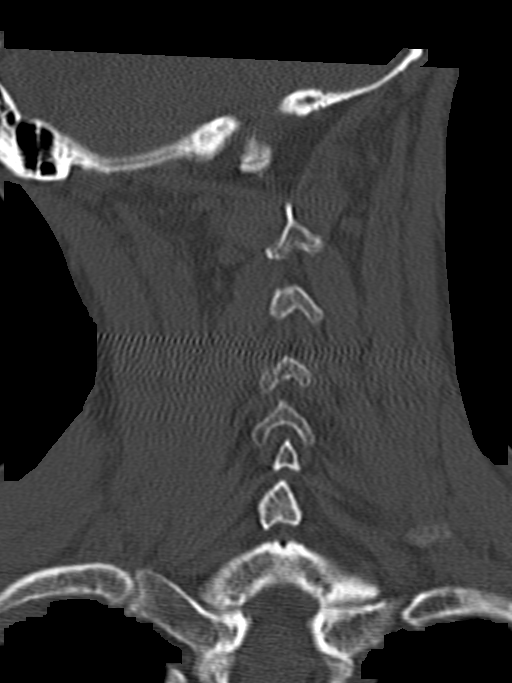

[Series 7: axial · axial · 0.23mm/px · z∈[-287,-185]mm · 5 of 81 slices shown, 7 images]
[im 12/81  soft-tissue]
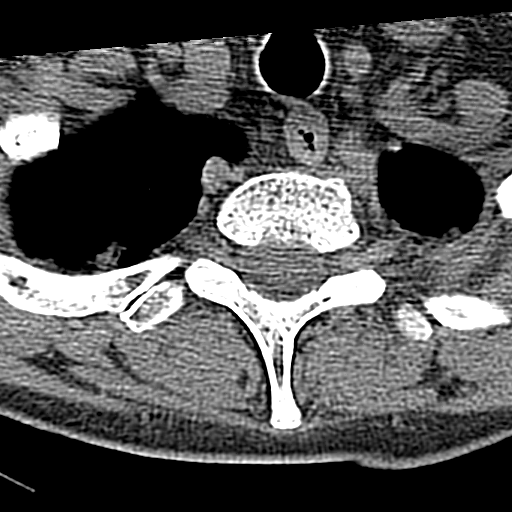
[im 12/81  bone]
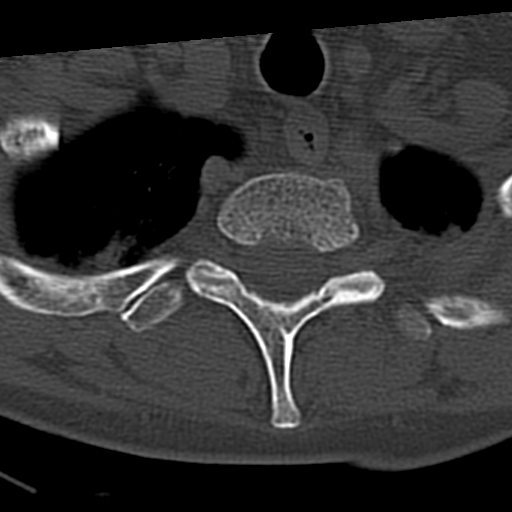
[im 23/81  bone]
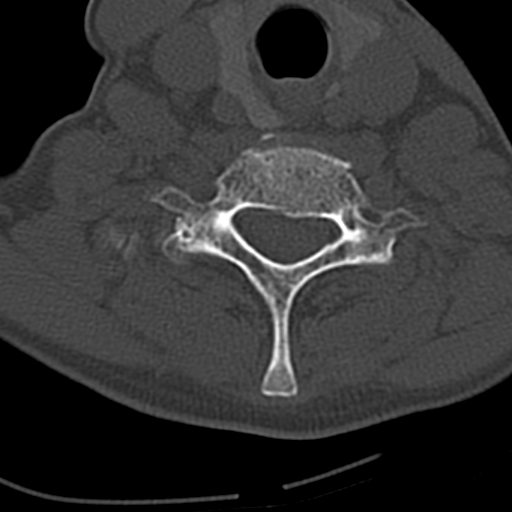
[im 46/81  bone]
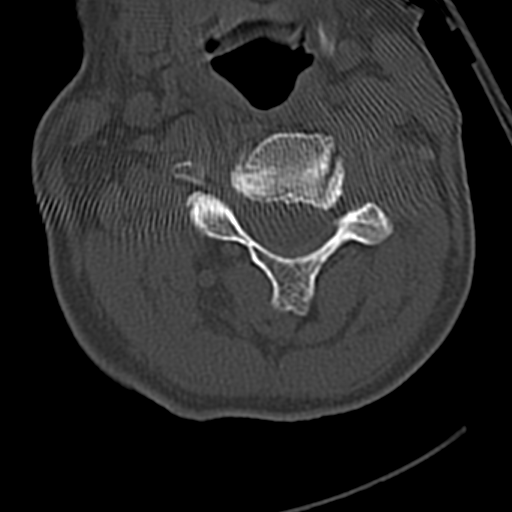
[im 58/81  bone]
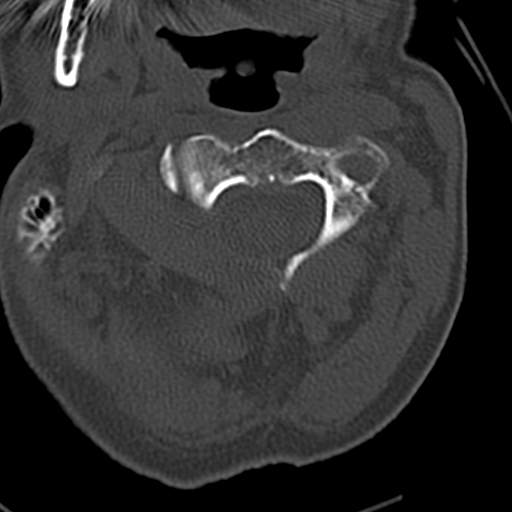
[im 69/81  soft-tissue]
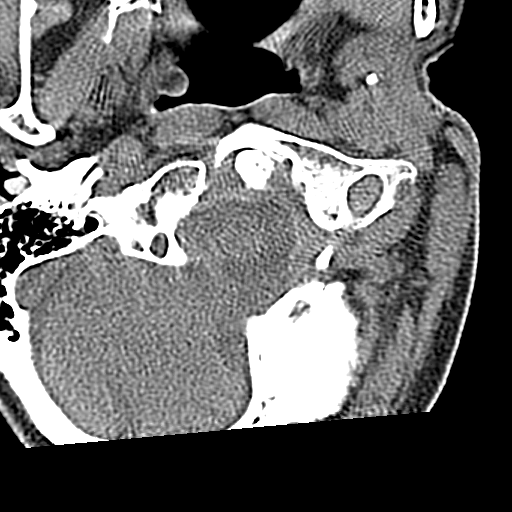
[im 69/81  bone]
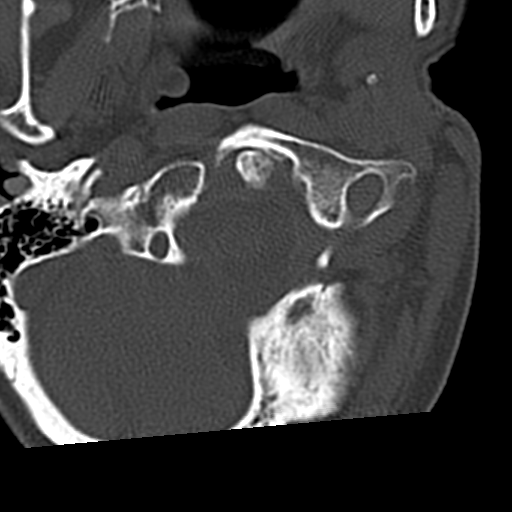

[Series 8: sagittal · sagittal · 0.23mm/px · 5 of 61 slices shown, 6 images]
[im 21/61  bone]
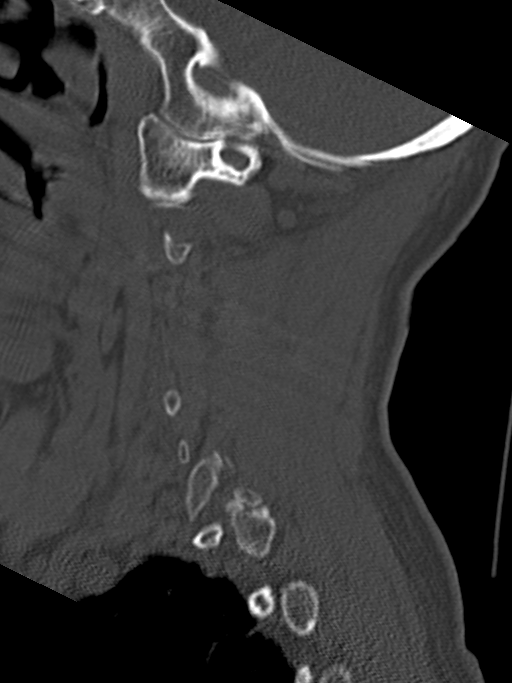
[im 26/61  bone]
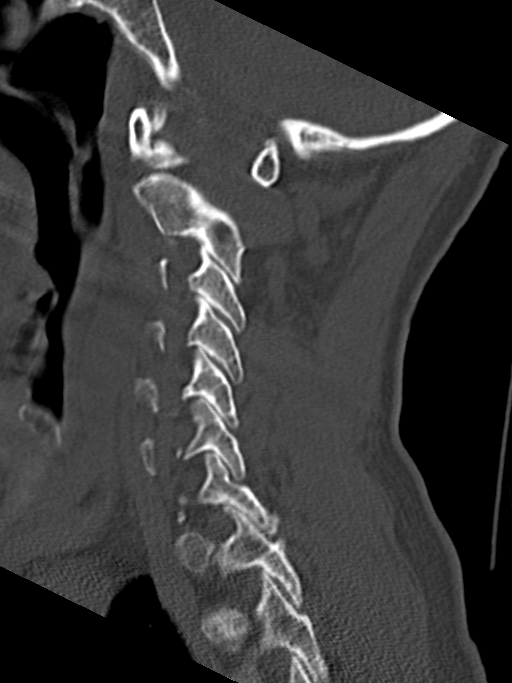
[im 31/61  soft-tissue]
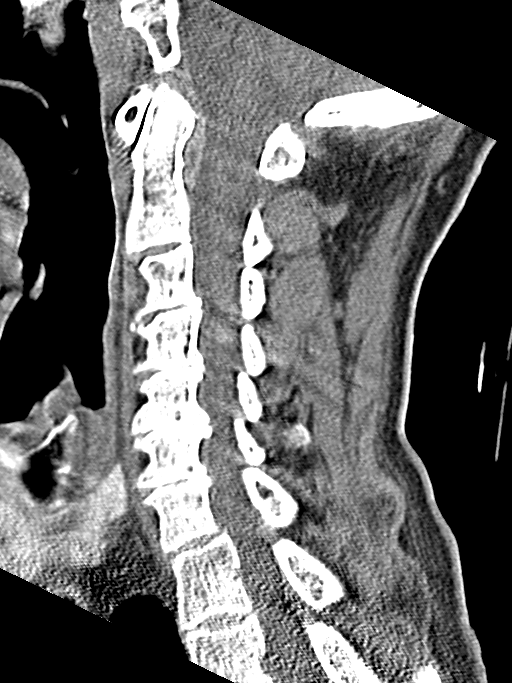
[im 31/61  bone]
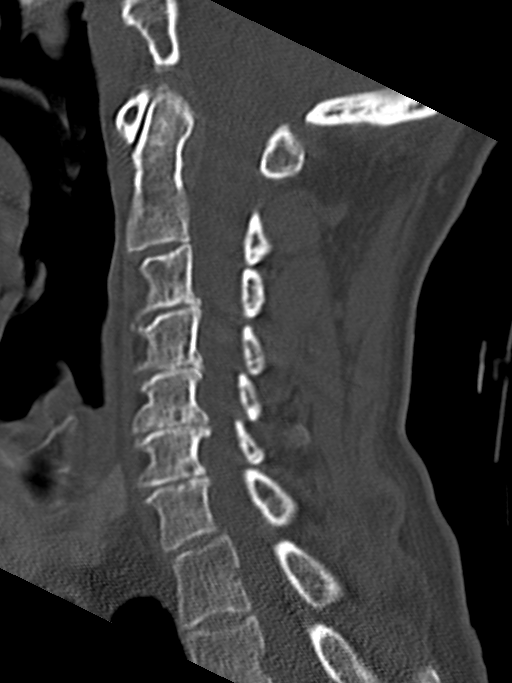
[im 36/61  bone]
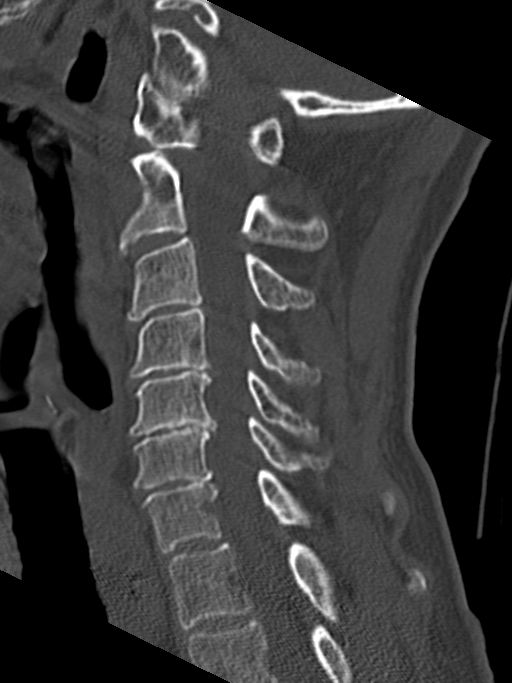
[im 41/61  bone]
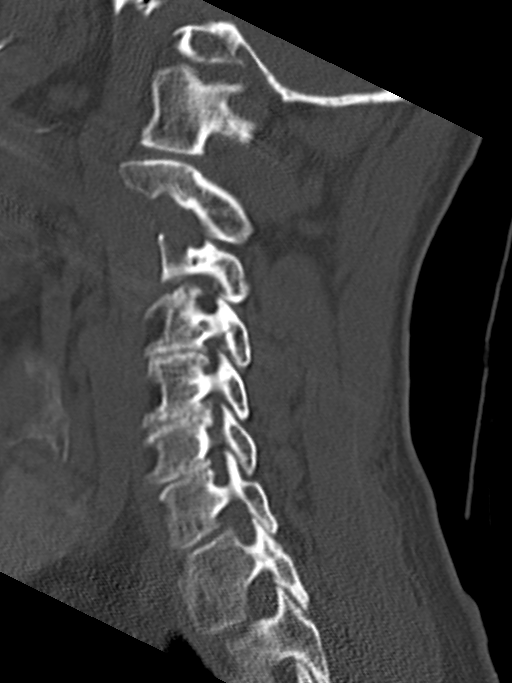

[13 of 33 positions shown; findings below may reference images not displayed]

FINDINGS: CT HEAD FINDINGS

Brain: Temporal lobe predominant cerebral atrophy. Moderate chronic
microvascular ischemic disease. No acute intracranial hemorrhage. No
evidence for acute large vessel territory infarct. No mass lesion,
midline shift or mass effect. Ventricular prominence related to
global parenchymal volume loss without hydrocephalus. No extra-axial
fluid collection.

Vascular: No hyperdense vessel. Scattered vascular calcifications
noted within the carotid siphons.

Skull: Scalp soft tissues and calvarium within normal limits.

Sinuses/Orbits: The globes and orbital soft tissues within normal
limits. Visualized paranasal sinuses are clear. No mastoid effusion.

Other: None.

CT CERVICAL SPINE FINDINGS

Alignment: Straightening of the normal cervical lordosis. Trace
anterolisthesis of C7 on T1.

Skull base and vertebrae: Skullbase intact. Normal C1-2
articulations are preserved. Dens is intact. Vertebral body heights
maintained. No acute fracture.

Soft tissues and spinal canal: Soft tissues of the neck demonstrate
no acute abnormality. No abnormal prevertebral edema.

Disc levels: Moderate multilevel degenerative spondylolysis at C4-5
through C6-7. Right-sided facet arthrosis present at C7-T1.

Upper chest: Visualized upper chest within normal limits. Visualized
lung apices are clear. No apical pneumothorax.

Other: None.
IMPRESSION: CT BRAIN:

1. No acute intracranial process identified.
2. Moderate cerebral atrophy with chronic small vessel ischemic
disease.

CT CERVICAL SPINE:

1. No acute traumatic injury within the cervical spine.
2. Moderate degenerative spondylolysis at C4-5 through C6-7.

## 2019-07-01 DIAGNOSIS — G300 Alzheimer's disease with early onset: Secondary | ICD-10-CM | POA: Diagnosis not present

## 2019-07-01 DIAGNOSIS — R54 Age-related physical debility: Secondary | ICD-10-CM | POA: Diagnosis not present

## 2019-07-01 DIAGNOSIS — F0151 Vascular dementia with behavioral disturbance: Secondary | ICD-10-CM | POA: Diagnosis not present

## 2019-07-10 DIAGNOSIS — R569 Unspecified convulsions: Secondary | ICD-10-CM | POA: Diagnosis not present

## 2019-07-10 DIAGNOSIS — Z5181 Encounter for therapeutic drug level monitoring: Secondary | ICD-10-CM | POA: Diagnosis not present

## 2019-07-11 DIAGNOSIS — I10 Essential (primary) hypertension: Secondary | ICD-10-CM | POA: Diagnosis not present

## 2019-07-11 DIAGNOSIS — D649 Anemia, unspecified: Secondary | ICD-10-CM | POA: Diagnosis not present

## 2019-07-17 DIAGNOSIS — D649 Anemia, unspecified: Secondary | ICD-10-CM | POA: Diagnosis not present

## 2019-07-17 DIAGNOSIS — Z5181 Encounter for therapeutic drug level monitoring: Secondary | ICD-10-CM | POA: Diagnosis not present

## 2019-08-05 DIAGNOSIS — G300 Alzheimer's disease with early onset: Secondary | ICD-10-CM | POA: Diagnosis not present

## 2019-08-05 DIAGNOSIS — R54 Age-related physical debility: Secondary | ICD-10-CM | POA: Diagnosis not present

## 2019-08-05 DIAGNOSIS — F0151 Vascular dementia with behavioral disturbance: Secondary | ICD-10-CM | POA: Diagnosis not present

## 2019-08-05 DIAGNOSIS — N3946 Mixed incontinence: Secondary | ICD-10-CM | POA: Diagnosis not present

## 2019-09-02 DIAGNOSIS — R54 Age-related physical debility: Secondary | ICD-10-CM | POA: Diagnosis not present

## 2019-09-02 DIAGNOSIS — G300 Alzheimer's disease with early onset: Secondary | ICD-10-CM | POA: Diagnosis not present

## 2019-09-02 DIAGNOSIS — F0151 Vascular dementia with behavioral disturbance: Secondary | ICD-10-CM | POA: Diagnosis not present

## 2019-09-10 DIAGNOSIS — Z20828 Contact with and (suspected) exposure to other viral communicable diseases: Secondary | ICD-10-CM | POA: Diagnosis not present

## 2019-09-24 DIAGNOSIS — Z20828 Contact with and (suspected) exposure to other viral communicable diseases: Secondary | ICD-10-CM | POA: Diagnosis not present

## 2019-09-29 ENCOUNTER — Inpatient Hospital Stay (HOSPITAL_COMMUNITY)
Admission: EM | Admit: 2019-09-29 | Discharge: 2019-10-07 | DRG: 871 | Disposition: A | Discharge status: Nursing Home (Includes ICF) | Payer: PPO | Source: Skilled Nursing Facility | Attending: Internal Medicine | Admitting: Internal Medicine

## 2019-09-29 ENCOUNTER — Emergency Department (HOSPITAL_COMMUNITY): Payer: PPO

## 2019-09-29 DIAGNOSIS — R52 Pain, unspecified: Secondary | ICD-10-CM | POA: Diagnosis not present

## 2019-09-29 DIAGNOSIS — A419 Sepsis, unspecified organism: Secondary | ICD-10-CM

## 2019-09-29 DIAGNOSIS — F419 Anxiety disorder, unspecified: Secondary | ICD-10-CM | POA: Diagnosis not present

## 2019-09-29 DIAGNOSIS — A4189 Other specified sepsis: Secondary | ICD-10-CM | POA: Diagnosis not present

## 2019-09-29 DIAGNOSIS — R1310 Dysphagia, unspecified: Secondary | ICD-10-CM | POA: Diagnosis not present

## 2019-09-29 DIAGNOSIS — R402421 Glasgow coma scale score 9-12, in the field [EMT or ambulance]: Secondary | ICD-10-CM | POA: Diagnosis not present

## 2019-09-29 DIAGNOSIS — G309 Alzheimer's disease, unspecified: Secondary | ICD-10-CM | POA: Diagnosis not present

## 2019-09-29 DIAGNOSIS — Z7401 Bed confinement status: Secondary | ICD-10-CM | POA: Diagnosis not present

## 2019-09-29 DIAGNOSIS — Z8673 Personal history of transient ischemic attack (TIA), and cerebral infarction without residual deficits: Secondary | ICD-10-CM

## 2019-09-29 DIAGNOSIS — I5181 Takotsubo syndrome: Secondary | ICD-10-CM

## 2019-09-29 DIAGNOSIS — F0150 Vascular dementia without behavioral disturbance: Secondary | ICD-10-CM | POA: Diagnosis not present

## 2019-09-29 DIAGNOSIS — F0281 Dementia in other diseases classified elsewhere with behavioral disturbance: Secondary | ICD-10-CM | POA: Diagnosis not present

## 2019-09-29 DIAGNOSIS — K5900 Constipation, unspecified: Secondary | ICD-10-CM | POA: Diagnosis not present

## 2019-09-29 DIAGNOSIS — N390 Urinary tract infection, site not specified: Secondary | ICD-10-CM | POA: Diagnosis not present

## 2019-09-29 DIAGNOSIS — G9340 Encephalopathy, unspecified: Secondary | ICD-10-CM | POA: Diagnosis not present

## 2019-09-29 DIAGNOSIS — U071 COVID-19: Secondary | ICD-10-CM | POA: Diagnosis not present

## 2019-09-29 DIAGNOSIS — R7989 Other specified abnormal findings of blood chemistry: Secondary | ICD-10-CM

## 2019-09-29 DIAGNOSIS — Z993 Dependence on wheelchair: Secondary | ICD-10-CM

## 2019-09-29 DIAGNOSIS — Z85828 Personal history of other malignant neoplasm of skin: Secondary | ICD-10-CM

## 2019-09-29 DIAGNOSIS — E876 Hypokalemia: Secondary | ICD-10-CM | POA: Diagnosis present

## 2019-09-29 DIAGNOSIS — Z209 Contact with and (suspected) exposure to unspecified communicable disease: Secondary | ICD-10-CM | POA: Diagnosis not present

## 2019-09-29 DIAGNOSIS — I509 Heart failure, unspecified: Secondary | ICD-10-CM | POA: Diagnosis present

## 2019-09-29 DIAGNOSIS — G300 Alzheimer's disease with early onset: Secondary | ICD-10-CM | POA: Diagnosis not present

## 2019-09-29 DIAGNOSIS — I959 Hypotension, unspecified: Secondary | ICD-10-CM | POA: Diagnosis present

## 2019-09-29 DIAGNOSIS — F0280 Dementia in other diseases classified elsewhere without behavioral disturbance: Secondary | ICD-10-CM | POA: Diagnosis present

## 2019-09-29 DIAGNOSIS — F329 Major depressive disorder, single episode, unspecified: Secondary | ICD-10-CM

## 2019-09-29 DIAGNOSIS — I491 Atrial premature depolarization: Secondary | ICD-10-CM | POA: Diagnosis not present

## 2019-09-29 DIAGNOSIS — R0602 Shortness of breath: Secondary | ICD-10-CM | POA: Diagnosis not present

## 2019-09-29 DIAGNOSIS — F32A Depression, unspecified: Secondary | ICD-10-CM

## 2019-09-29 DIAGNOSIS — I9589 Other hypotension: Secondary | ICD-10-CM | POA: Diagnosis not present

## 2019-09-29 DIAGNOSIS — I248 Other forms of acute ischemic heart disease: Secondary | ICD-10-CM | POA: Diagnosis not present

## 2019-09-29 DIAGNOSIS — Z66 Do not resuscitate: Secondary | ICD-10-CM | POA: Diagnosis not present

## 2019-09-29 DIAGNOSIS — Z743 Need for continuous supervision: Secondary | ICD-10-CM | POA: Diagnosis not present

## 2019-09-29 DIAGNOSIS — R778 Other specified abnormalities of plasma proteins: Secondary | ICD-10-CM | POA: Diagnosis not present

## 2019-09-29 DIAGNOSIS — I480 Paroxysmal atrial fibrillation: Secondary | ICD-10-CM

## 2019-09-29 DIAGNOSIS — R4689 Other symptoms and signs involving appearance and behavior: Secondary | ICD-10-CM

## 2019-09-29 DIAGNOSIS — R279 Unspecified lack of coordination: Secondary | ICD-10-CM | POA: Diagnosis not present

## 2019-09-29 DIAGNOSIS — R0902 Hypoxemia: Secondary | ICD-10-CM | POA: Diagnosis not present

## 2019-09-29 DIAGNOSIS — R404 Transient alteration of awareness: Secondary | ICD-10-CM | POA: Diagnosis not present

## 2019-09-29 DIAGNOSIS — Z880 Allergy status to penicillin: Secondary | ICD-10-CM | POA: Diagnosis not present

## 2019-09-29 DIAGNOSIS — F02818 Dementia in other diseases classified elsewhere, unspecified severity, with other behavioral disturbance: Secondary | ICD-10-CM

## 2019-09-29 DIAGNOSIS — R410 Disorientation, unspecified: Secondary | ICD-10-CM

## 2019-09-29 LAB — CBC WITH DIFFERENTIAL/PLATELET
Abs Immature Granulocytes: 0.03 10*3/uL (ref 0.00–0.07)
Basophils Absolute: 0 10*3/uL (ref 0.0–0.1)
Basophils Relative: 0 %
Eosinophils Absolute: 0 10*3/uL (ref 0.0–0.5)
Eosinophils Relative: 0 %
HCT: 43.3 % (ref 36.0–46.0)
Hemoglobin: 14.4 g/dL (ref 12.0–15.0)
Immature Granulocytes: 0 %
Lymphocytes Relative: 22 %
Lymphs Abs: 1.6 10*3/uL (ref 0.7–4.0)
MCH: 28 pg (ref 26.0–34.0)
MCHC: 33.3 g/dL (ref 30.0–36.0)
MCV: 84.1 fL (ref 80.0–100.0)
Monocytes Absolute: 0.5 10*3/uL (ref 0.1–1.0)
Monocytes Relative: 6 %
Neutro Abs: 5.2 10*3/uL (ref 1.7–7.7)
Neutrophils Relative %: 72 %
Platelets: 166 10*3/uL (ref 150–400)
RBC: 5.15 MIL/uL — ABNORMAL HIGH (ref 3.87–5.11)
RDW: 12.8 % (ref 11.5–15.5)
WBC: 7.3 10*3/uL (ref 4.0–10.5)
nRBC: 0 % (ref 0.0–0.2)

## 2019-09-29 LAB — URINALYSIS, ROUTINE W REFLEX MICROSCOPIC
Bilirubin Urine: NEGATIVE
Glucose, UA: NEGATIVE mg/dL
Ketones, ur: NEGATIVE mg/dL
Nitrite: POSITIVE — AB
Protein, ur: 100 mg/dL — AB
Specific Gravity, Urine: 1.019 (ref 1.005–1.030)
WBC, UA: >50 WBC/hpf — ABNORMAL HIGH (ref 0–5)
pH: 6 (ref 5.0–8.0)

## 2019-09-29 LAB — COMPREHENSIVE METABOLIC PANEL
ALT: 24 U/L (ref 0–44)
AST: 50 U/L — ABNORMAL HIGH (ref 15–41)
Albumin: 2.9 g/dL — ABNORMAL LOW (ref 3.5–5.0)
Alkaline Phosphatase: 82 U/L (ref 38–126)
Anion gap: 16 — ABNORMAL HIGH (ref 5–15)
BUN: 19 mg/dL (ref 8–23)
CO2: 24 mmol/L (ref 22–32)
Calcium: 9.1 mg/dL (ref 8.9–10.3)
Chloride: 102 mmol/L (ref 98–111)
Creatinine, Ser: 0.83 mg/dL (ref 0.44–1.00)
GFR calc Af Amer: >60 mL/min (ref >60)
GFR calc non Af Amer: >60 mL/min (ref >60)
Glucose, Bld: 171 mg/dL — ABNORMAL HIGH (ref 70–99)
Potassium: 3.8 mmol/L (ref 3.5–5.1)
Sodium: 142 mmol/L (ref 135–145)
Total Bilirubin: 0.7 mg/dL (ref 0.3–1.2)
Total Protein: 6.8 g/dL (ref 6.5–8.1)

## 2019-09-29 LAB — LACTIC ACID, PLASMA
Lactic Acid, Venous: 2.9 mmol/L (ref 0.5–1.9)
Lactic Acid, Venous: 2.2 mmol/L (ref 0.5–1.9)

## 2019-09-29 LAB — RESPIRATORY PANEL BY RT PCR (FLU A&B, COVID)
Influenza A by PCR: NEGATIVE
Influenza B by PCR: NEGATIVE
SARS Coronavirus 2 by RT PCR: POSITIVE — AB

## 2019-09-29 LAB — PROTIME-INR
INR: 0.9 (ref 0.8–1.2)
Prothrombin Time: 12.1 seconds (ref 11.4–15.2)

## 2019-09-29 LAB — TRIGLYCERIDES: Triglycerides: 96 mg/dL (ref <150)

## 2019-09-29 LAB — C-REACTIVE PROTEIN: CRP: 4.7 mg/dL — ABNORMAL HIGH (ref <1.0)

## 2019-09-29 LAB — TROPONIN I (HIGH SENSITIVITY)
Troponin I (High Sensitivity): 714 ng/L (ref <18)
Troponin I (High Sensitivity): 1474 ng/L (ref <18)

## 2019-09-29 LAB — D-DIMER, QUANTITATIVE: D-Dimer, Quant: 2.02 ug/mL-FEU — ABNORMAL HIGH (ref 0.00–0.50)

## 2019-09-29 LAB — LACTATE DEHYDROGENASE: LDH: 271 U/L — ABNORMAL HIGH (ref 98–192)

## 2019-09-29 LAB — FIBRINOGEN: Fibrinogen: 417 mg/dL (ref 210–475)

## 2019-09-29 LAB — FERRITIN: Ferritin: 199 ng/mL (ref 11–307)

## 2019-09-29 MED ORDER — LACTATED RINGERS IV BOLUS (SEPSIS)
250.0000 mL | Freq: Once | INTRAVENOUS | Status: AC
  Administered 2019-09-29: 250 mL via INTRAVENOUS

## 2019-09-29 MED ORDER — SODIUM CHLORIDE 0.9 % IV SOLN: 2.0000 g | Freq: Two times a day (BID) | INTRAVENOUS | Status: DC

## 2019-09-29 MED ORDER — VANCOMYCIN HCL 1250 MG/250ML IV SOLN
1250.0000 mg | Freq: Once | INTRAVENOUS | Status: AC
  Administered 2019-09-29: 1250 mg via INTRAVENOUS
  Filled 2019-09-29: qty 250

## 2019-09-29 MED ORDER — SODIUM CHLORIDE 0.9 % IV SOLN
2.0000 g | Freq: Once | INTRAVENOUS | Status: AC
  Administered 2019-09-29: 2 g via INTRAVENOUS
  Filled 2019-09-29: qty 2

## 2019-09-29 MED ORDER — ENOXAPARIN SODIUM 30 MG/0.3ML ~~LOC~~ SOLN
30.0000 mg | SUBCUTANEOUS | Status: DC
  Administered 2019-09-30 – 2019-10-01 (×2): 30 mg via SUBCUTANEOUS
  Filled 2019-09-29 (×2): qty 0.3

## 2019-09-29 MED ORDER — LACTATED RINGERS IV BOLUS (SEPSIS)
1000.0000 mL | Freq: Once | INTRAVENOUS | Status: AC
  Administered 2019-09-29: 1000 mL via INTRAVENOUS

## 2019-09-29 MED ORDER — SODIUM CHLORIDE 0.9 % IV SOLN: INTRAVENOUS | Status: DC

## 2019-09-29 MED ORDER — VANCOMYCIN HCL IN DEXTROSE 1-5 GM/200ML-% IV SOLN: 1000.0000 mg | Freq: Once | INTRAVENOUS | Status: DC

## 2019-09-29 MED ORDER — METRONIDAZOLE IN NACL 5-0.79 MG/ML-% IV SOLN
500.0000 mg | Freq: Once | INTRAVENOUS | Status: AC
  Administered 2019-09-29: 500 mg via INTRAVENOUS
  Filled 2019-09-29: qty 100

## 2019-09-29 MED ORDER — VANCOMYCIN HCL IN DEXTROSE 1-5 GM/200ML-% IV SOLN: 1000.0000 mg | INTRAVENOUS | Status: DC

## 2019-09-29 MED ORDER — LACTATED RINGERS IV BOLUS (SEPSIS)
500.0000 mL | Freq: Once | INTRAVENOUS | Status: AC
  Administered 2019-09-29: 500 mL via INTRAVENOUS

## 2019-09-29 NOTE — Progress Notes (Signed)
Pharmacy Antibiotic Note  Toni Sullivan is a 69 y.o. female admitted on 09/29/2019 with sepsis.  Pharmacy has been consulted for Cefepime and Vancomycin dosing.   Height: 5\' 4"  (162.6 cm) Weight: 120 lb (54.4 kg) IBW/kg (Calculated) : 54.7  Temp (24hrs), Avg:100.3 F (37.9 C), Min:100.3 F (37.9 C), Max:100.3 F (37.9 C)  Recent Labs  Lab 09/29/19 2008  WBC 7.3    CrCl cannot be calculated (Patient's most recent lab result is older than the maximum 21 days allowed.).    Allergies  Allergen Reactions  . Penicillins Other (See Comments)    Has patient had a PCN reaction causing immediate rash, facial/tongue/throat swelling, SOB or lightheadedness with hypotension: Unkown PCN reaction causing severe rash involving mucus membranes or skin necrosis: Unknown PCN reaction that required hospitalization: Unknown PCN reaction occurring within the last 10 years: No If all of the above answers are "NO", then may proceed with Cephalosporin use. Tolerated Cefepime, Ceftriaxone (06/2018)     Antimicrobials this admission: 1/31 Cefepime >>  1/31 Vancomycin >>   Dose adjustments this admission:   Microbiology results: Pending  Plan:  - Cefepime 2g IV q12hr - Vancomycin 1250mg  IV x 1 dose  - Followed by Vancomycin 1000mg  IV q24hr - Est Calc AUC 514 - Monitor patients renal function and urine output   Thank you for allowing pharmacy to be a part of this patient's care.  2/31 PharmD. BCPS  09/29/2019 8:20 PM

## 2019-09-29 NOTE — ED Triage Notes (Signed)
Pt coming [facility] from [facility] c/o of pt being slummped over turning blue, when EMS arrived she was "white as a ghost". Initial SBP was 76, and only was responsive to painful stimuli. 90SBP once on truck.  110HR 103Temp Oral 23 on Capnography

## 2019-09-29 NOTE — ED Notes (Signed)
Date and time results received: 09/29/19 "8:49 PM  Test: Lactic Acid   Critical Value: 2.9  Name of Provider Notified: Stevie Kern, MD  Orders Received? Or Actions Taken?:No new orders

## 2019-09-29 NOTE — H&P (Signed)
History and Physical    Toni Sullivan NTI:[id number] DOB: 1951/07/10 DOA: 09/29/2019  PCP: Jinny Sanders, MD  Patient coming from: [facility]  I have personally briefly reviewed patient's old medical records in [place]  Chief Complaint: Hypotension  HPI: Toni Sullivan is a 69 y.o. female with medical history significant Alzheimer's dementia, CVA, depression/anxiety who presented from nursing home for concerns of  cyanosis and responsiveness only to noxious stimuli. Found to have SBP of 76. Fever of 103.  Patient is unable to provide history.  Per husband she has incoherent speech at baseline and is bedbound/wheelchair bound.  He is unable to provide any more history regarding her symptoms since he has not seen her at the nursing facility for some time.   On arrival to ED, She was febrile up to 100.3 and hypotensive with systolic down to 86P. This improved to about systolic of 619 after 5.09T of IV fluid.   WBC of 7.3. Lactate of 2.9. Glucose of 171, creatinine normal at 0.83, anion gap of 16 UA had positive leukocytes and nitrates and few bacteria with >50 WBC. Positive POC COVID test. CXR negative.  Troponin of 714.  She was started on broad spectrum antibiotics with IV cefepime, vancomycin and flagyl prior to COVID results returning positive.   Review of Systems:  She was unable to provide history given severe dementia  Past Medical History:  Diagnosis Date  . Dementia ([place])   . Headache(784.0)   . Memory loss   . Stroke [facility])     Past Surgical History:  Procedure Laterality Date  . MOUTH SURGERY    . SKIN CANCER EXCISION       reports that she has never smoked. She has never used smokeless tobacco. She reports current alcohol use. She reports that she does not use drugs.  Allergies  Allergen Reactions  . Penicillins Other (See Comments)    Has patient had a PCN reaction causing immediate rash, facial/tongue/throat swelling, SOB or lightheadedness  with hypotension: Unkown PCN reaction causing severe rash involving mucus membranes or skin necrosis: Unknown PCN reaction that required hospitalization: Unknown PCN reaction occurring within the last 10 years: No If all of the above answers are "NO", then may proceed with Cephalosporin use. Tolerated Cefepime, Ceftriaxone (06/2018)       Prior to Admission medications   Medication Sig Start Date End Date Taking? Authorizing Provider  acetaminophen (TYLENOL) 325 MG tablet Take 650 mg by mouth every 8 (eight) hours as needed (pain level 1-8 or temp greater than 100 deg F).   Yes [provider]  acetaminophen (TYLENOL) 500 MG tablet Take 1 tablet (500 mg total) by mouth every 6 (six) hours as needed for mild pain, moderate pain or fever. Patient taking differently: Take 500 mg by mouth every 4 (four) hours as needed for headache (minor discomfort and for fever 99.5-101 deg F).  07/31/18  Yes Hongalgi, Lenis Dickinson, MD  alum & mag hydroxide-simeth (MINTOX) 200-200-20 MG/5ML suspension Take 30 mLs by mouth 4 (four) times daily as needed for indigestion or heartburn. Do not exceed 4 doses in 24 hours    Yes [provider]  divalproex (DEPAKOTE SPRINKLE) 125 MG capsule Take 250 mg by mouth 3 (three) times daily.   Yes [provider]  fluPHENAZine (PROLIXIN) 1 MG tablet Take 1 mg by mouth at bedtime. 08/01/17  Yes [provider]  furosemide (LASIX) 20 MG tablet Take 1 tablet (20 mg total) by  mouth every other day. 07/31/18  Yes Hongalgi, Maximino Greenland, MD  guaifenesin (ROBITUSSIN) 100 MG/5ML syrup Take 200 mg by mouth every 6 (six) hours as needed for cough.   Yes [provider]  loperamide (IMODIUM) 2 MG capsule Take 2 mg by mouth as needed for diarrhea or loose stools. Do not exceed 8 doses in 24 hours   Yes [provider]  LORazepam (ATIVAN) 0.5 MG tablet Take 1 tablet (0.5 mg total) by mouth every 8 (eight) hours as needed for anxiety. Patient taking  differently: Take 0.5 mg by mouth 2 (two) times daily. For anxiety and agitation 07/31/18  Yes Hongalgi, Maximino Greenland, MD  magnesium hydroxide (MILK OF MAGNESIA) 400 MG/5ML suspension Take 30 mLs by mouth at bedtime as needed for mild constipation.   Yes [provider]  Melatonin 3 MG TABS Take 3 mg by mouth at bedtime.   Yes [provider]  mirtazapine (REMERON) 15 MG tablet Take 15 mg by mouth at bedtime.   Yes [provider]  Neomycin-Bacitracin-Polymyxin (TRIPLE ANTIBIOTIC) [phone] OINT Apply 1 application topically as needed (minor skin tears or abrasions).   Yes [provider]  Rivastigmine (EXELON) 13.3 MG/24HR PT24 Apply 1 patch (13.3 mg total) topically daily. 09/01/16  Yes Nilda Riggs, NP  sertraline (ZOLOFT) 25 MG tablet Take 75 mg by mouth daily. 07/24/17  Yes [provider]    Physical Exam: Vitals:   09/29/19 2230 09/29/19 2245 09/29/19 2300 09/29/19 2315  BP:  100/78 101/70 97/78  Pulse: 80 70 70 73  Resp: 16 11 15 12   Temp:      TempSrc:      SpO2: 98% 99% 97% 97%  Weight:      Height:        Constitutional: non-toxic appearing cachetic frail elderly female laying in bed Vitals:   09/29/19 2230 09/29/19 2245 09/29/19 2300 09/29/19 2315  BP:  100/78 101/70 97/78  Pulse: 80 70 70 73  Resp: 16 11 15 12   Temp:      TempSrc:      SpO2: 98% 99% 97% 97%  Weight:      Height:       Eyes: PERRL, lids and conjunctivae normal ENMT: Mucous membranes are dry  Neck: normal, supple Respiratory: clear to auscultation bilaterally, no wheezing, no crackles. Normal respiratory effort on room air.   Cardiovascular: Regular rate and rhythm, no murmurs / rubs / gallops. No extremity edema. 2+ pedal pulses.   Abdomen: difficult to assess for tenderness due to dementia , no masses palpated.Bowel sounds positive.  GU: purewick in place with diaper Musculoskeletal: no clubbing / cyanosis. No joint deformity upper and lower  extremities. Contractures on all extremity. Normal muscle tone.  Skin: no rashes, lesions, ulcers. No induration Neurologic: Pt mumbles but incoherent speech. Unable to answer questions or follow commands.  Psychiatric: Unable to assess due to dementia   Labs on Admission: I have personally reviewed following labs and imaging studies  CBC: Recent Labs  Lab 09/29/19 2008  WBC 7.3  NEUTROABS 5.2  HGB 14.4  HCT 43.3  MCV 84.1  PLT 166   Basic Metabolic Panel: Recent Labs  Lab 09/29/19 2008  NA 142  K 3.8  CL 102  CO2 24  GLUCOSE 171*  BUN 19  CREATININE 0.83  CALCIUM 9.1   GFR: Estimated Creatinine Clearance: 54.2 mL/min (by C-G formula based on SCr of 0.83 mg/dL). Liver Function Tests: Recent Labs  Lab 09/29/19  2008  AST 50*  ALT 24  ALKPHOS 82  BILITOT 0.7  PROT 6.8  ALBUMIN 2.9*   No results for input(s): LIPASE, AMYLASE in the last 168 hours. No results for input(s): AMMONIA in the last 168 hours. Coagulation Profile: Recent Labs  Lab 09/29/19 2008  INR 0.9   Cardiac Enzymes: No results for input(s): CKTOTAL, CKMB, CKMBINDEX, TROPONINI in the last 168 hours. BNP (last 3 results) No results for input(s): PROBNP in the last 8760 hours. HbA1C: No results for input(s): HGBA1C in the last 72 hours. CBG: No results for input(s): GLUCAP in the last 168 hours. Lipid Profile: No results for input(s): CHOL, HDL, LDLCALC, TRIG, CHOLHDL, LDLDIRECT in the last 72 hours. Thyroid Function Tests: No results for input(s): TSH, T4TOTAL, FREET4, T3FREE, THYROIDAB in the last 72 hours. Anemia Panel: No results for input(s): VITAMINB12, FOLATE, FERRITIN, TIBC, IRON, RETICCTPCT in the last 72 hours. Urine analysis:    Component Value Date/Time   COLORURINE AMBER (A) 09/29/2019 2153   APPEARANCEUR CLOUDY (A) 09/29/2019 2153   LABSPEC 1.019 09/29/2019 2153   PHURINE 6.0 09/29/2019 2153   GLUCOSEU NEGATIVE 09/29/2019 2153   HGBUR SMALL (A) 09/29/2019 2153    BILIRUBINUR NEGATIVE 09/29/2019 2153   KETONESUR NEGATIVE 09/29/2019 2153   PROTEINUR 100 (A) 09/29/2019 2153   NITRITE POSITIVE (A) 09/29/2019 2153   LEUKOCYTESUR LARGE (A) 09/29/2019 2153    Radiological Exams on Admission: DG Chest Port 1 View  Result Date: 09/29/2019 CLINICAL DATA:  Shortness of breath. EXAM: PORTABLE CHEST 1 VIEW COMPARISON:  July 24, 2018 FINDINGS: The lungs are hyperinflated. There is no evidence of acute infiltrate, pleural effusion or pneumothorax. The heart size and mediastinal contours are within normal limits. Chronic ninth and tenth right rib fractures are seen. The visualized skeletal structures are otherwise unremarkable. IMPRESSION: No active disease. Electronically Signed   By: Aram Candela M.D.   On: 09/29/2019 20:49    EKG: Independently reviewed.   Assessment/Plan  Hypotension in the setting of COVID viral infection and questionable UTI Received 1.75 L in ED with improvement of BP. Continue IV 75cc NS fluid   COVID viral infection Not hypoxic on admit Continuous pulse ox, maintain O2 >92%  UTI vs asymptomatic bacteriuria Pt unable to give symptoms due to dementia Already given broad spectrum antibiotics with IV cefepime, vancomycin and flagyl Will continue Rocephin tomorrow and await urine culture. Has PCN allergy listed but noted to tolerate Rocephin in past admissions.   Elevated anion gap Suspect due to infection, hypovolemia.  Continue IV fluids. Monitor  Elevated troponin Elevated from 714 to >1400- no acute EKG changes. Discussed with cardiology fellow Dr. Tressie Ellis -Suspect most likely demand ischemic from COVID infection and hypotension will continue to trend If continues trends upwards, consider obtaining echocardiogram. Pt is not a good candidate for invasive intervention given severe dementia, non functional at baseline.   Dementia with mood disorder Per her husband at baseline she is not conversational and wheelchair bound.  Appears to be at her baseline on admit. N.p.o.pending speech eval Monitor vital signs Continue rivastigmine patch  paroxysmal A. fib Not on anticoagulation due to hx of falls  Chronic depression/anxiety Resume outpatient medications when able to swallow safely   Prior history of CVA Per husband patient is mostly bedbound/wheelchair   DVT prophylaxis:.Lovenox Code Status: DNR- presented with [place] and confirmed with husband Family Communication: Plan discussed with patient at bedside  disposition Plan: Husband updated over the phone. Consults called:  Admission status:  inpatient  Anselm Jungling DO [facility]   If 7PM-7AM, please contact night-coverage [email or web address]   09/29/2019, 11:29 PM

## 2019-09-29 NOTE — ED Provider Notes (Signed)
Toni Sullivan Provider Note   CSN: [id number] Arrival date & time: 09/29/19  1944     History No chief complaint on file.   Toni Sullivan is a 69 y.S[place]. female.  Present emergency room chief complaint sepsis.  Dementia.  Level 5 caveat.  History limited.    History obtained by report, patient's husband.  Toni Sullivan's reported episode of unresponsiveness, turning blue.  EMS states when they arrived she looked white as a ghost.  Initially hypotensive and responsive to painful stimuli but BP improved in route.  Husband states patient has long history of dementia, DNR.  Unaware of having any issues until today.  Has not seen her recently.    HPI     Past Medical History:  Diagnosis Date  . Dementia ([facility])   . Headache(784.0)   . Memory loss   . Stroke [facility])     Patient Active Problem List   Diagnosis Date Noted  . Sepsis secondary to UTI ([facility]) 07/22/2018  . UTI (urinary tract infection) 06/12/2017  . Delirium 06/12/2017  . Aggressive behavior   . Urinary tract infection without hematuria   . Acute metabolic encephalopathy 02/03/2017  . Acute lower UTI 02/03/2017  . 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 History of CVA (cerebrovascular accident) 07/03/2013    Past Surgical History:  Procedure Laterality Date  . MOUTH SURGERY    . SKIN CANCER EXCISION       OB History   No obstetric history on file.     No family history on file.  Social History   Tobacco Use  . Smoking status: Never Smoker  . Smokeless tobacco: Never Used  Substance Use Topics  . Alcohol use: Yes    Comment: 1 glass daily  . Drug use: No    Home Medications Prior to Admission medications   Medication Sig Start Date End Date Taking? Authorizing Provider  acetaminophen (TYLENOL) 325 MG tablet Take 650 mg by mouth every 8 (eight) hours as needed (pain level 1-8 or temp greater than 100 deg F).    Yes Provider, Historical, [place]  acetaminophen (TYLENOL) 500 MG tablet Take 1 tablet (500 mg total) by mouth every 6 (six) hours as needed for mild pain, moderate pain or fever. Patient taking differently: Take 500 mg by mouth every 4 (four) hours as needed for headache (minor discomfort and for fever 99.5-101 deg F).  07/31/18  Yes Toni Sullivan  alum & mag hydroxide-simeth (MINTOX) 200-200-20 MG/5ML suspension Take 30 mLs by mouth 4 (four) times daily as needed for indigestion or heartburn. Do not exceed 4 doses in 24 hours    Yes Provider, Historical, [place]  divalproex (DEPAKOTE SPRINKLE) 125 MG capsule Take 250 mg by mouth 3 (three) times daily.   Yes Provider, Historical, [place]  fluPHENAZine (PROLIXIN) 1 MG tablet Take 1 mg by mouth at bedtime. 08/01/17  Yes Provider, Historical, [place]  furosemide (LASIX) 20 MG tablet Take 1 tablet (20 mg total) by mouth every other day. 07/31/18  Yes Toni Sullivan  guaifenesin (ROBITUSSIN) 100 MG/5ML syrup Take 200 mg by mouth every 6 (six) hours as needed for cough.   Yes Provider, Historical, [place]  loperamide (IMODIUM) 2 MG capsule Take 2 mg by mouth as needed for diarrhea or loose stools. Do not exceed 8 doses in 24 hours   Yes Provider, Historical, [place]  LORazepam (ATIVAN) 0.5 MG tablet Take 1  tablet (0.5 mg total) by mouth every 8 (eight) hours as needed for anxiety. Patient taking differently: Take 0.5 mg by mouth 2 (two) times daily. For anxiety and agitation 07/31/18  Yes Toni Sullivan  magnesium hydroxide (MILK OF MAGNESIA) 400 MG/5ML suspension Take 30 mLs by mouth at bedtime as needed for mild constipation.   Yes Provider, Historical, [place]  Melatonin 3 MG TABS Take 3 mg by mouth at bedtime.   Yes Provider, Historical, [place]  mirtazapine (REMERON) 15 MG tablet Take 15 mg by mouth at bedtime.   Yes Provider, Historical, [place]  Neomycin-Bacitracin-Polymyxin (TRIPLE ANTIBIOTIC) [phone] OINT Apply 1 application topically as needed (minor skin tears or  abrasions).   Yes Provider, Historical, [place]  Rivastigmine (EXELON) 13.3 MG/24HR PT24 Apply 1 patch (13.3 mg total) topically daily. 09/01/16  Yes Toni Sullivan  sertraline (ZOLOFT) 25 MG tablet Take 75 mg by mouth daily. 07/24/17  Yes Provider, Historical, [place]    Allergies    Penicillins  Review of Systems   Review of Systems  Unable to perform ROS: Dementia    Physical Exam Updated Vital Signs BP 100/78   Pulse 70   Temp 100.3 F (37.9 C) (Rectal)   Resp 11   Ht 5\' 4"  (1.626 m)   Wt 54.4 kg   SpO2 99%   BMI 20.60 kg/m   Physical Exam Constitutional:      Comments: Dementia, no distress  HENT:     Head: Normocephalic and atraumatic.     Nose: Nose normal.     Mouth/Throat:     Mouth: Mucous membranes are moist.  Eyes:     Extraocular Movements: Extraocular movements intact.     Pupils: Pupils are equal, round, and reactive to light.  Cardiovascular:     Rate and Rhythm: Normal rate.     Pulses: Normal pulses.  Pulmonary:     Effort: Pulmonary effort is normal.     Breath sounds: Normal breath sounds.  Abdominal:     General: Abdomen is flat. There is no distension.     Palpations: There is no mass.     Tenderness: There is no abdominal tenderness. There is no guarding or rebound.     Hernia: No hernia is present.  Musculoskeletal:        General: No swelling or tenderness.     Cervical back: Normal range of motion. No rigidity.  Skin:    General: Skin is warm.     Capillary Refill: Capillary refill takes less than 2 seconds.  Neurological:     General: No focal deficit present.     Comments: Alert, opens eyes spontaneously, moves all 4 extremities spontaneously, speaks incomprehensible words     ED Results / Procedures / Treatments   Labs (all labs ordered are listed, but only abnormal results are displayed) Labs Reviewed  RESPIRATORY PANEL BY RT PCR (FLU A&B, COVID) - Abnormal; Notable for the following components:      Result Value   SARS  Coronavirus 2 by RT PCR POSITIVE (*)    All other components within normal limits  LACTIC ACID, PLASMA - Abnormal; Notable for the following components:   Lactic Acid, Venous 2.9 (*)    All other components within normal limits  COMPREHENSIVE METABOLIC PANEL - Abnormal; Notable for the following components:   Glucose, Bld 171 (*)    Albumin 2.9 (*)    AST 50 (*)    Anion gap 16 (*)  All other components within normal limits  CBC WITH DIFFERENTIAL/PLATELET - Abnormal; Notable for the following components:   RBC 5.15 (*)    All other components within normal limits  URINALYSIS, ROUTINE W REFLEX MICROSCOPIC - Abnormal; Notable for the following components:   Color, Urine AMBER (*)    APPearance CLOUDY (*)    Hgb urine dipstick SMALL (*)    Protein, ur 100 (*)    Nitrite POSITIVE (*)    Leukocytes,Ua LARGE (*)    WBC, UA >50 (*)    Bacteria, UA FEW (*)    All other components within normal limits  TROPONIN I (HIGH SENSITIVITY) - Abnormal; Notable for the following components:   Troponin I (High Sensitivity) 714 (*)    All other components within normal limits  CULTURE, BLOOD (ROUTINE X 2)  CULTURE, BLOOD (ROUTINE X 2)  URINE CULTURE  PROTIME-INR  LACTIC ACID, PLASMA  D-DIMER, QUANTITATIVE (NOT AT [facility])  PROCALCITONIN  LACTATE DEHYDROGENASE  FERRITIN  TRIGLYCERIDES  FIBRINOGEN  C-REACTIVE PROTEIN  TROPONIN I (HIGH SENSITIVITY)    EKG EKG Interpretation  Date/Time:  Sunday September 29 2019 21:06:30 EST Ventricular Rate:  81 PR Interval:    QRS Duration: 95 QT Interval:  409 QTC Calculation: 475 R Axis:   90 Text Interpretation: Sinus rhythm Lateral infarct, old Confirmed by Marianna Fuss ([id number]) on 09/29/2019 9:24:58 PM   Radiology DG Chest Port 1 View  Result Date: 09/29/2019 CLINICAL DATA:  Shortness of breath. EXAM: PORTABLE CHEST 1 VIEW COMPARISON:  July 24, 2018 FINDINGS: The lungs are hyperinflated. There is no evidence of acute infiltrate, pleural  effusion or pneumothorax. The heart size and mediastinal contours are within normal limits. Chronic ninth and tenth right rib fractures are seen. The visualized skeletal structures are otherwise unremarkable. IMPRESSION: No active disease. Electronically Signed   By: Aram Candela M.D.   On: 09/29/2019 20:49    Procedures .Critical Care Performed by: Milagros Loll, [place] Authorized by: Milagros Loll, Sullivan   Critical care provider statement:    Critical care time (minutes):  45   Critical care was necessary to treat or prevent imminent or life-threatening deterioration of the following conditions:  Sepsis and CNS failure or compromise   Critical care was time spent personally by me on the following activities:  Discussions with consultants, evaluation of patient's response to treatment, examination of patient, ordering and performing treatments and interventions, ordering and review of laboratory studies, ordering and review of radiographic studies, pulse oximetry, re-evaluation of patient's condition, obtaining history from patient or surrogate and review of old charts   (including critical care time)  Medications Ordered in ED Medications  ceFEPIme (MAXIPIME) 2 g in sodium chloride 0.9 % 100 mL IVPB (has no administration in time range)  vancomycin (VANCOCIN) IVPB 1000 mg/200 mL premix (has no administration in time range)  lactated ringers bolus 1,000 mL (0 mLs Intravenous Stopped 09/29/19 2153)    And  lactated ringers bolus 500 mL (0 mLs Intravenous Stopped 09/29/19 2221)    And  lactated ringers bolus 250 mL (0 mLs Intravenous Stopped 09/29/19 2221)  ceFEPIme (MAXIPIME) 2 g in sodium chloride 0.9 % 100 mL IVPB (0 g Intravenous Stopped 09/29/19 2054)  metroNIDAZOLE (FLAGYL) IVPB 500 mg (0 mg Intravenous Stopped 09/29/19 2118)  vancomycin (VANCOREADY) IVPB 1250 mg/250 mL (0 mg Intravenous Stopped 09/29/19 2221)    ED Course  I have reviewed the triage vital signs and the nursing  notes.  Pertinent labs &  imaging results that were available during my care of the patient were reviewed by me and considered in my medical decision making (see chart for details).  Clinical Course as of Sep 28 2257  Sun Sep 29, 2019  2100 Suspect demand related, will repeat EKG, will get second troponin  Troponin I (High Sensitivity)(!!): 714 [RD]  2121 Discussed with husband, updated on current status, confirms DNR status   [RD]  2236 SARS Coronavirus 2 by RT PCR(!): POSITIVE [RD]  2249 Updated husband, will consult hospitalist for admission   [RD]  2256 Discussed with [facility] hospitalist who accepts   [RD]    Clinical Course User Index [RD] Lucrezia Starch, [place]   MDM Rules/Calculators/A&P                      69 year old lady presented to ER after increased confusion, episode of unresponsiveness at facility.  Baseline severe dementia, DNR.  Concern for possible sepsis given the reported hypotension and fever.  Ordered broad-spectrum antibiotics, fluid bolus, broad work-up including Covid testing.  Work-up was concerning for positive coronavirus.  Troponin also elevated.  No obvious ischemic EKG changes, in setting of Covid, sepsis, suspect demand related and doubt primary ACS event.  Will need to be trended while in hospital.  CXR without significant infiltrate, on room air currently.  Given elevated troponin, reported hypotension prior to arrival and increased confusion, will admit patient to hospitalist service.  Discussed case with the hospitalist who will accept.  Dr. Flossie Buffy will accept.  Final Clinical Impression(s) / ED Diagnoses Final diagnoses:  COVID-19  Encephalopathy  Urinary tract infection without hematuria, site unspecified  Elevated lactic acid level  Sepsis, due to unspecified organism, unspecified whether acute organ dysfunction present [facility])    Rx / DC Orders ED Discharge Orders    None       Lucrezia Starch, [place] 09/29/19 2259

## 2019-09-29 NOTE — ED Notes (Signed)
Date and time results received: 09/29/19  "8:57 PM  Test: Troponin Critical Value: 714  Name of Provider Notified: Stevie Kern, MD  Orders Received? Or Actions Taken?: No new orders at this time

## 2019-09-29 NOTE — ED Notes (Signed)
Date and time results received: 09/29/19 2353  Test: Troponin Critical Value: 1474  Name of Provider Notified: Cyndia Bent, MD  Orders Received? Or Actions Taken?: none at this time

## 2019-09-30 DIAGNOSIS — I480 Paroxysmal atrial fibrillation: Secondary | ICD-10-CM

## 2019-09-30 DIAGNOSIS — R778 Other specified abnormalities of plasma proteins: Secondary | ICD-10-CM

## 2019-09-30 DIAGNOSIS — U071 COVID-19: Secondary | ICD-10-CM

## 2019-09-30 DIAGNOSIS — F329 Major depressive disorder, single episode, unspecified: Secondary | ICD-10-CM

## 2019-09-30 DIAGNOSIS — F419 Anxiety disorder, unspecified: Secondary | ICD-10-CM

## 2019-09-30 DIAGNOSIS — F32A Depression, unspecified: Secondary | ICD-10-CM

## 2019-09-30 LAB — TROPONIN I (HIGH SENSITIVITY)
Troponin I (High Sensitivity): 1371 ng/L (ref <18)
Troponin I (High Sensitivity): 1279 ng/L (ref <18)
Troponin I (High Sensitivity): 1812 ng/L (ref <18)

## 2019-09-30 LAB — ABO/RH: ABO/RH(D): B POS

## 2019-09-30 LAB — MRSA PCR SCREENING: MRSA by PCR: POSITIVE — AB

## 2019-09-30 LAB — PROCALCITONIN: Procalcitonin: <0.1 ng/mL

## 2019-09-30 LAB — HIV ANTIBODY (ROUTINE TESTING W REFLEX): HIV Screen 4th Generation wRfx: NONREACTIVE

## 2019-09-30 MED ORDER — MUPIROCIN 2 % EX OINT
1.0000 "application " | TOPICAL_OINTMENT | Freq: Two times a day (BID) | CUTANEOUS | Status: AC
  Administered 2019-09-30 – 2019-10-04 (×10): 1 via NASAL
  Filled 2019-09-30 (×2): qty 22

## 2019-09-30 MED ORDER — RIVASTIGMINE 13.3 MG/24HR TD PT24
13.3000 mg | MEDICATED_PATCH | Freq: Every day | TRANSDERMAL | Status: DC
  Administered 2019-09-30 – 2019-10-07 (×8): 13.3 mg via TRANSDERMAL
  Filled 2019-09-30 (×9): qty 1

## 2019-09-30 MED ORDER — SODIUM CHLORIDE 0.9 % IV SOLN
1.0000 g | INTRAVENOUS | Status: DC
  Administered 2019-09-30 – 2019-10-06 (×7): 1 g via INTRAVENOUS
  Filled 2019-09-30 (×7): qty 10

## 2019-09-30 MED ORDER — RESOURCE THICKENUP CLEAR PO POWD
ORAL | Status: DC | PRN
  Filled 2019-09-30: qty 125

## 2019-09-30 MED ORDER — SODIUM CHLORIDE 0.9 % IV BOLUS
500.0000 mL | Freq: Once | INTRAVENOUS | Status: AC
  Administered 2019-09-30: 500 mL via INTRAVENOUS

## 2019-09-30 MED ORDER — FLUPHENAZINE HCL 1 MG PO TABS
1.0000 mg | ORAL_TABLET | Freq: Every day | ORAL | Status: DC
  Administered 2019-10-01 – 2019-10-06 (×6): 1 mg via ORAL
  Filled 2019-09-30 (×9): qty 1

## 2019-09-30 MED ORDER — MIRTAZAPINE 15 MG PO TABS
15.0000 mg | ORAL_TABLET | Freq: Every day | ORAL | Status: DC
  Administered 2019-10-01 – 2019-10-06 (×6): 15 mg via ORAL
  Filled 2019-09-30 (×6): qty 1

## 2019-09-30 MED ORDER — LORAZEPAM 1 MG PO TABS: 0.5000 mg | ORAL_TABLET | Freq: Two times a day (BID) | ORAL | Status: DC

## 2019-09-30 MED ORDER — LORAZEPAM 2 MG/ML IJ SOLN: 0.5000 mg | INTRAMUSCULAR | Status: DC | PRN

## 2019-09-30 MED ORDER — CHLORHEXIDINE GLUCONATE CLOTH 2 % EX PADS
6.0000 | MEDICATED_PAD | Freq: Every day | CUTANEOUS | Status: AC
  Administered 2019-09-30 – 2019-10-04 (×5): 6 via TOPICAL

## 2019-09-30 MED ORDER — DIVALPROEX SODIUM 125 MG PO CSDR
250.0000 mg | DELAYED_RELEASE_CAPSULE | Freq: Three times a day (TID) | ORAL | Status: DC
  Administered 2019-09-30 – 2019-10-07 (×21): 250 mg via ORAL
  Filled 2019-09-30 (×22): qty 2

## 2019-09-30 MED ORDER — MELATONIN 3 MG PO TABS
3.0000 mg | ORAL_TABLET | Freq: Every day | ORAL | Status: DC
  Administered 2019-10-01 – 2019-10-06 (×6): 3 mg via ORAL
  Filled 2019-09-30 (×9): qty 1

## 2019-09-30 MED ORDER — SERTRALINE HCL 50 MG PO TABS
75.0000 mg | ORAL_TABLET | Freq: Every day | ORAL | Status: DC
  Administered 2019-10-01 – 2019-10-07 (×7): 75 mg via ORAL
  Filled 2019-09-30 (×8): qty 1

## 2019-09-30 NOTE — Progress Notes (Addendum)
PROGRESS NOTE  SPARKLE AUBE KGU:[id number] DOB: [birth year] DOA: 09/29/2019 PCP: Excell Seltzer, MD  Brief History   Toni Sullivan is a 69 y.o. female with medical history significant Alzheimer's dementia, CVA, depression/anxiety who presented from nursing home for concerns of  cyanosis and responsiveness only to noxious stimuli. Found to have SBP of 76. Fever of 103.  Patient is unable to provide history.  Per husband she has incoherent speech at baseline and is bedbound/wheelchair bound.  He is unable to provide any more history regarding her symptoms since he has not seen her at the nursing facility for some time.   On arrival to ED, She was febrile up to 100.3 and hypotensive with systolic down to 62G. This improved to about systolic of 102 after 1.75L of IV fluid.   WBC of 7.3. Lactate of 2.9. Glucose of 171, creatinine normal at 0.83, anion gap of 16 UA had positive leukocytes and nitrates and few bacteria with >50 WBC. Positive POC COVID test. CXR negative.  Troponin of 714.  She was started on broad spectrum antibiotics with IV cefepime, vancomycin and flagyl prior to COVID results returning positive.  She has been admitted to a telemetry bed. She also has been found to have a UTI for which she is receiving ceftriaxone. She has shaking chills. Flagyl, Cefepime, and vancomycin have been discontinued.   Consultants  . None  Procedures  . None  Antibiotics   Anti-infectives (From admission, onward)   Start     Dose/Rate Route Frequency Ordered Stop   09/30/19 2100  vancomycin (VANCOCIN) IVPB 1000 mg/200 mL premix  Status:  Discontinued     1,000 mg 200 mL/hr over 60 Minutes Intravenous Every 24 hours 09/29/19 2104 09/30/19 0013   09/30/19 1000  cefTRIAXone (ROCEPHIN) 1 g in sodium chloride 0.9 % 100 mL IVPB     1 g 200 mL/hr over 30 Minutes Intravenous Every 24 hours 09/30/19 0013     09/30/19 0830  ceFEPIme (MAXIPIME) 2 g in sodium chloride 0.9 % 100 mL IVPB   Status:  Discontinued     2 g 200 mL/hr over 30 Minutes Intravenous Every 12 hours 09/29/19 2104 09/30/19 0013   09/29/19 2015  ceFEPIme (MAXIPIME) 2 g in sodium chloride 0.9 % 100 mL IVPB     2 g 200 mL/hr over 30 Minutes Intravenous  Once 09/29/19 2009 09/29/19 2054   09/29/19 2015  metroNIDAZOLE (FLAGYL) IVPB 500 mg     500 mg 100 mL/hr over 60 Minutes Intravenous  Once 09/29/19 2009 09/29/19 2118   09/29/19 2015  vancomycin (VANCOCIN) IVPB 1000 mg/200 mL premix  Status:  Discontinued     1,000 mg 200 mL/hr over 60 Minutes Intravenous  Once 09/29/19 2009 09/29/19 2011   09/29/19 2015  vancomycin (VANCOREADY) IVPB 1250 mg/250 mL     1,250 mg 166.7 mL/hr over 90 Minutes Intravenous  Once 09/29/19 2011 09/29/19 2221    .   Subjective  The patient is lying quietly with her eyes closed. She is having chills. She responds to me with moans and movement. She is non-verbal.  Objective   Vitals:  Vitals:   09/30/19 0448 09/30/19 0800  BP: 90/69 93/71  Pulse: 68 70  Resp: (!) 22 16  Temp: 98.3 F (36.8 C)   SpO2: 95% 96%   Exam:  Constitutional:  . The patient is awake, alert, and oriented x 3. No acute distress. Respiratory:  . No increased work of breathing. Marland Kitchen  No wheezes, rales, or rhonchi . No tactile fremitus Cardiovascular:  . Regular rate and rhythm . No murmurs, ectopy, or gallups. . No lateral PMI. No thrills. Abdomen:  . Abdomen is soft, non-tender, non-distended . No hernias, masses, or organomegaly . Normoactive bowel sounds.  Musculoskeletal:  . No cyanosis, clubbing, or edema Skin:  . No rashes, lesions, ulcers . palpation of skin: no induration or nodules Neurologic:  . CN 2-12 intact . Sensation all 4 extremities intact Psychiatric:  . Mental status o Mood, affect appropriate o Orientation to person, place, time  . judgment and insight appear intact  I have personally reviewed the following:   Today's Data  . Vitals, CMP, CBC  Scheduled Meds:  . Chlorhexidine Gluconate Cloth  6 each Topical Q0600  . divalproex  250 mg Oral TID  . enoxaparin (LOVENOX) injection  30 mg Subcutaneous Q24H  . fluPHENAZine  1 mg Oral QHS  . Melatonin  3 mg Oral QHS  . mirtazapine  15 mg Oral QHS  . mupirocin ointment  1 application Nasal BID  . rivastigmine  13.3 mg Transdermal Q breakfast  . sertraline  75 mg Oral Daily   Continuous Infusions: . sodium chloride 75 mL/hr at 09/30/19 1449  . 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 Received 1.75 L in ED with improvement of BP. Blood pressures remain low. Will give another bolus and continue maintenance fluids. The patient is not taking much PO.   COVID viral infection: Not hypoxic on admit or today. LDH elevated at 271, Ferritin WNL at 199, CRP elevated at 4.7. D Dimer at 2.2. Monitor inflammatory markers and respiratory status.   UTI vs asymptomatic bacteriuria: Pt unable to give symptoms due to dementia. Continue IV Rocephine. Urine culture is pending.   Elevated anion gap: Suspect due to infection, hypovolemia.  Continue IV fluids. Monitor  Elevated troponin:  Elevated from 714 to >1400- no acute EKG changes. Discussed with cardiology fellow Dr. Tressie Ellis. It is suspected most likely to be due to demand ischemic from COVID infection and hypotension. Continue to trend. Most recently trending down. Monitor. f trends upwards, consider obtaining echocardiogram. Pt is not a good candidate for invasive intervention given severe dementia, non  functional at baseline.   Dementia with mood disorder: Per her husband at baseline she is not conversational and wheelchair bound. Appears to be at her baseline on admit.  Dysphagia: Chronic. SLP has placed the patient on a DYS 1 diet with pureed foods and thickened liquids. Her husband states that this is what she eats at the care center where she resides. Continue rivastigmine patch  Paroxysmal A. Fib: Not on anticoagulation due to hx of falls  Chronic depression/anxiety: Resume outpatient medicationswhen able to swallow safely.  Prior history of CVA with resultant dysphagia as stated above. Per husband patient is mostly bedbound/wheelchair .  I have seen and examined this patient myself.  I have spent 45 minutes in her evaluation and care. More than 50% of this was spent in  sounseling with the patient's husband Juleen China. All questions answered to the best of my ability.  DVT prophylaxis:.Lovenox Code Status: DNR- presented with Goldenrod and confirmed with husband Family Communication: I have discussed with the patient over the phone. disposition Plan: Husband updated over the phone.  Evrett Hakim, DO [facility] Direct contact: see [email or web address]  7PM-7AM contact night coverage as above 09/30/2019, 5:02 PM  LOS: 1 day

## 2019-09-30 NOTE — Progress Notes (Signed)
I was notified by nursing staff of hypotension (69/49). Provider notified and orders received for IVF bolus. Pt is DNR. No RRRN interventions.

## 2019-09-30 NOTE — Progress Notes (Addendum)
Patient SBP 80/55 at 1952 after receiving 500 ml Bolus.  Patient is less responsive (confused at baseline).  Hospitalist, [employer].  Rapid response called.  500 ml bolus given.  No other interventions ordered.  Patient SBP dropped to 60's after bolus.    0200  Patient b/p 67/53.  Patient is NAD.  95% on RA.  Minimally responsive and confused (baseline).

## 2019-09-30 NOTE — Progress Notes (Signed)
CRITICAL VALUE ALERT  Critical Value: MRSA +  Date & Time Notied:  09/30/19 1148  Provider Notified: Swayze  Orders Received/Actions taken: MRSA protocol intiated.

## 2019-09-30 NOTE — Plan of Care (Signed)
  Problem: Education: Goal: Knowledge of risk factors and measures for prevention of condition will improve Outcome: Not Progressing   Problem: Coping: Goal: Psychosocial and spiritual needs will be supported Outcome: Not Progressing   Problem: Respiratory: Goal: Will maintain a patent airway Outcome: Progressing Goal: Complications related to the disease process, condition or treatment will be avoided or minimized Outcome: Progressing   Problem: Education: Goal: Knowledge of General Education information will improve Description: Including pain rating scale, medication(s)/side effects and non-pharmacologic comfort measures Outcome: Progressing   Problem: Health Behavior/Discharge Planning: Goal: Ability to manage health-related needs will improve Outcome: Progressing   Problem: Health Behavior/Discharge Planning: Goal: Ability to manage health-related needs will improve Outcome: Progressing   Problem: Clinical Measurements: Goal: Ability to maintain clinical measurements within normal limits will improve Outcome: Progressing Goal: Will remain free from infection Outcome: Progressing Goal: Diagnostic test results will improve Outcome: Progressing Goal: Respiratory complications will improve Outcome: Progressing Goal: Cardiovascular complication will be avoided Outcome: Progressing   Problem: Activity: Goal: Risk for activity intolerance will decrease Outcome: Progressing   Problem: Nutrition: Goal: Adequate nutrition will be maintained Outcome: Progressing   Problem: Coping: Goal: Level of anxiety will decrease Outcome: Progressing   Problem: Elimination: Goal: Will not experience complications related to bowel motility Outcome: Progressing Goal: Will not experience complications related to urinary retention Outcome: Progressing   Problem: Pain Managment: Goal: General experience of comfort will improve Outcome: Progressing   Problem: Safety: Goal:  Ability to remain free from injury will improve Outcome: Progressing   Problem: Skin Integrity: Goal: Risk for impaired skin integrity will decrease Outcome: Progressing

## 2019-09-30 NOTE — Progress Notes (Addendum)
Toni Sullivan is a 69 y.o. female patient admitted from ED awake, not alert or oriented - no acute distress noted.  VSS - Blood pressure 90/66, pulse 74, temperature 98.5 F (36.9 C), temperature source Axillary, resp. rate 17, height 5\' 4"  (1.626 m), weight 54.4 kg, SpO2 97 %.    IV in place, occlusive dsg intact without redness.  Patient confused, unable to orient to room or unit.   Admission INP armband ID verified with staff member and in place.  Patient placed on telemetry. SR up x 3, fall assessment complete.  Bed alarm activated.  Call light within reach,   No evidence of skin break down noted on 2 person exam.  Mepilex placed to prevent skin breakdown.  Unable to complete patient database as patient is nonverbal and noninteractive.     Will cont to eval and treat per MD orders.  , RN 09/30/2019 1:50 AM   Contacted hospitalist regarding safety of giving PO meds with patient confusion/not interacting.  Holding PO meds per hospitalist.

## 2019-09-30 NOTE — Evaluation (Signed)
Clinical/Bedside Swallow Evaluation Patient Details  Name: Toni Sullivan MRN: [id number] Date of Birth: 1950-11-27  Today's Date: 09/30/2019 Time: SLP Start Time (ACUTE ONLY): 1330 SLP Stop Time (ACUTE ONLY): 1346 SLP Time Calculation (min) (ACUTE ONLY): 16 min  Past Medical History:  Past Medical History:  Diagnosis Date  . Dementia (HCC)   . Headache(784.0)   . Memory loss   . Stroke [facility])    Past Surgical History:  Past Surgical History:  Procedure Laterality Date  . MOUTH SURGERY    . SKIN CANCER EXCISION     HPI:  Toni Sullivan is a 69 y.o. female with medical history significant Alzheimer's dementia, CVA, depression/anxiety who presented from nursing home for concerns of  cyanosis and responsiveness only to noxious stimuli. Positive POC COVID test. CXR negative. BSE 11/20119 rec puree, thin (this was preadmission diet).    Assessment / Plan / Recommendation Clinical Impression  Pt exhibited moderate-severe aspiration and malnutrition risk in setting of advanced demential. Pt is contracted, babbling, head extension when approached with po's but therapist able to manually flex neck position partially. Oral examination revealed dry lips, excresence on tongue tip and dried secretions to upper dentition prior to oral hygiene. Oral control and labial seal were moderate-poor although she surprisingly accepted spoon and cup trials thin and honey thick juice. Labial residue and inefficient seal. Swallow initiaion was only  minimally and intermittently delayed. Water trials did not result in cough or throat clear but prolonged pharyngeal phase was subjectively noted. From respiratory standpoing with Covid she was stable; room air and no dyspnea. Do not recommend thin with head extension and poor oral control. She would be an optimal pt for Palliative care to assist with GOC (MD in agreement). Recommend full assist with puree, honey thick. crush meds. SLP will follow up once.   SLP Visit  Diagnosis: Dysphagia, unspecified (R13.10)    Aspiration Risk  Severe aspiration risk;Moderate aspiration risk    Diet Recommendation Honey-thick liquid;Dysphagia 1 (Puree)   Liquid Administration via: Cup Medication Administration: Crushed with puree Supervision: Full supervision/cueing for compensatory strategies;Staff to assist with self feeding Compensations: Slow rate;Small sips/bites;Lingual sweep for clearance of pocketing Postural Changes: Seated upright at 90 degrees    Other  Recommendations Oral Care Recommendations: Oral care QID Other Recommendations: Order thickener from pharmacy   Follow up Recommendations Skilled Nursing facility      Frequency and Duration min 1 x/week  1 week       Prognosis Prognosis for Safe Diet Advancement: (fair-guarded) Barriers to Reach Goals: Cognitive deficits;Severity of deficits      Swallow Study   General HPI: Toni Sullivan is a 68 y.o. female with medical history significant Alzheimer's dementia, CVA, depression/anxiety who presented from nursing home for concerns of  cyanosis and responsiveness only to noxious stimuli. Positive POC COVID test. CXR negative. BSE 11/20119 rec puree, thin (this was preadmission diet).  Type of Study: Bedside Swallow Evaluation Previous Swallow Assessment: (see HPI) Diet Prior to this Study: NPO Temperature Spikes Noted: No Respiratory Status: Room air History of Recent Intubation: No Behavior/Cognition: Doesn't follow directions;Confused Oral Cavity Assessment: Dry;Dried secretions Oral Care Completed by SLP: Yes(limited) Oral Cavity - Dentition: Poor condition;Missing dentition Self-Feeding Abilities: Total assist Patient Positioning: (contracted, ) Baseline Vocal Quality: Normal Volitional Cough: Cognitively unable to elicit Volitional Swallow: Unable to elicit    Oral/Motor/Sensory Function Overall Oral Motor/Sensory Function: Generalized oral weakness   Ice Chips Ice chips: Not  tested   Thin  Liquid Thin Liquid: Impaired Presentation: Cup Oral Phase Impairments: Reduced labial seal;Reduced lingual movement/coordination Oral Phase Functional Implications: Right anterior spillage;Left anterior spillage Pharyngeal  Phase Impairments: Multiple swallows    Nectar Thick Nectar Thick Liquid: Not tested   Honey Thick Honey Thick Liquid: Impaired Oral Phase Impairments: Reduced lingual movement/coordination Oral Phase Functional Implications: Other (comment)(labial residue) Pharyngeal Phase Impairments: (none)   Puree Puree: Impaired Oral Phase Impairments: Reduced labial seal   Solid     Solid: Not tested      Houston Siren 09/30/2019,2:11 PM   Orbie Pyo Bearden.Ed Risk analyst [phone] Office [phone]

## 2019-10-01 ENCOUNTER — Inpatient Hospital Stay (HOSPITAL_COMMUNITY): Payer: PPO

## 2019-10-01 ENCOUNTER — Other Ambulatory Visit: Payer: Self-pay

## 2019-10-01 ENCOUNTER — Encounter (HOSPITAL_COMMUNITY): Payer: Self-pay | Admitting: Family Medicine

## 2019-10-01 LAB — BASIC METABOLIC PANEL
Anion gap: 8 (ref 5–15)
BUN: 13 mg/dL (ref 8–23)
CO2: 21 mmol/L — ABNORMAL LOW (ref 22–32)
Calcium: 8 mg/dL — ABNORMAL LOW (ref 8.9–10.3)
Chloride: 115 mmol/L — ABNORMAL HIGH (ref 98–111)
Creatinine, Ser: 0.53 mg/dL (ref 0.44–1.00)
GFR calc Af Amer: >60 mL/min (ref >60)
GFR calc non Af Amer: >60 mL/min (ref >60)
Glucose, Bld: 99 mg/dL (ref 70–99)
Potassium: 3.2 mmol/L — ABNORMAL LOW (ref 3.5–5.1)
Sodium: 144 mmol/L (ref 135–145)

## 2019-10-01 LAB — CBC WITH DIFFERENTIAL/PLATELET
Abs Immature Granulocytes: 0.02 10*3/uL (ref 0.00–0.07)
Basophils Absolute: 0 10*3/uL (ref 0.0–0.1)
Basophils Relative: 0 %
Eosinophils Absolute: 0 10*3/uL (ref 0.0–0.5)
Eosinophils Relative: 0 %
HCT: 33.1 % — ABNORMAL LOW (ref 36.0–46.0)
Hemoglobin: 11.3 g/dL — ABNORMAL LOW (ref 12.0–15.0)
Immature Granulocytes: 0 %
Lymphocytes Relative: 33 %
Lymphs Abs: 1.9 10*3/uL (ref 0.7–4.0)
MCH: 28.1 pg (ref 26.0–34.0)
MCHC: 34.1 g/dL (ref 30.0–36.0)
MCV: 82.3 fL (ref 80.0–100.0)
Monocytes Absolute: 0.4 10*3/uL (ref 0.1–1.0)
Monocytes Relative: 7 %
Neutro Abs: 3.3 10*3/uL (ref 1.7–7.7)
Neutrophils Relative %: 60 %
Platelets: 164 10*3/uL (ref 150–400)
RBC: 4.02 MIL/uL (ref 3.87–5.11)
RDW: 12.9 % (ref 11.5–15.5)
WBC: 5.7 10*3/uL (ref 4.0–10.5)
nRBC: 0 % (ref 0.0–0.2)

## 2019-10-01 LAB — TROPONIN I (HIGH SENSITIVITY)
Troponin I (High Sensitivity): 941 ng/L (ref <18)
Troponin I (High Sensitivity): 964 ng/L (ref <18)

## 2019-10-01 MED ORDER — WHITE PETROLATUM EX OINT
TOPICAL_OINTMENT | CUTANEOUS | Status: AC
  Administered 2019-10-01: 0.2
  Filled 2019-10-01: qty 28.35

## 2019-10-01 MED ORDER — METOPROLOL TARTRATE 12.5 MG HALF TABLET
12.5000 mg | ORAL_TABLET | Freq: Two times a day (BID) | ORAL | Status: DC
  Filled 2019-10-01: qty 1

## 2019-10-01 MED ORDER — ASPIRIN 325 MG PO TABS
325.0000 mg | ORAL_TABLET | Freq: Every day | ORAL | Status: DC
  Administered 2019-10-01 – 2019-10-07 (×7): 325 mg via ORAL
  Filled 2019-10-01 (×7): qty 1

## 2019-10-01 MED ORDER — POTASSIUM CHLORIDE 10 MEQ/100ML IV SOLN
10.0000 meq | INTRAVENOUS | Status: AC
  Administered 2019-10-01 (×4): 10 meq via INTRAVENOUS
  Filled 2019-10-01 (×4): qty 100

## 2019-10-01 MED ORDER — ENOXAPARIN SODIUM 40 MG/0.4ML ~~LOC~~ SOLN
40.0000 mg | SUBCUTANEOUS | Status: DC
  Administered 2019-10-02 – 2019-10-07 (×6): 40 mg via SUBCUTANEOUS
  Filled 2019-10-01 (×6): qty 0.4

## 2019-10-01 NOTE — Progress Notes (Signed)
PROGRESS NOTE  Toni Sullivan ION:[id number] DOB: [birth year] DOA: 09/29/2019 PCP: Toni Sanders, MD  Brief History   ADDALIE Sullivan is a 69 y.o. female with medical history significant Alzheimer's dementia, CVA, depression/anxiety who presented from nursing home for concerns of  cyanosis and responsiveness only to noxious stimuli. Found to have SBP of 76. Fever of 103.  Patient is unable to provide history.  Per husband she has incoherent speech at baseline and is bedbound/wheelchair bound.  He is unable to provide any more history regarding her symptoms since he has not seen her at the nursing facility for some time.   On arrival to ED, She was febrile up to 100.3 and hypotensive with systolic down to 24M. This improved to about systolic of 010 after 2.72Z of IV fluid.   WBC of 7.3. Lactate of 2.9. Glucose of 171, creatinine normal at 0.83, anion gap of 16 UA had positive leukocytes and nitrates and few bacteria with >50 WBC. Positive POC COVID test. CXR negative.  Troponin of 714.  She was started on broad spectrum antibiotics with IV cefepime, vancomycin and flagyl prior to COVID results returning positive.  She has been admitted to a telemetry bed. She also has been found to have a UTI for which she is receiving ceftriaxone. She has shaking chills. Flagyl, Cefepime, and vancomycin have been discontinued.   There was a rapid response called yesterday evening due to hypotension. She was given a fluid bolus.  Consultants  . None  Procedures  . None  Antibiotics   Anti-infectives (From admission, onward)   Start     Dose/Rate Route Frequency Ordered Stop   09/30/19 2100  vancomycin (VANCOCIN) IVPB 1000 mg/200 mL premix  Status:  Discontinued     1,000 mg 200 mL/hr over 60 Minutes Intravenous Every 24 hours 09/29/19 2104 09/30/19 0013   09/30/19 1000  cefTRIAXone (ROCEPHIN) 1 g in sodium chloride 0.9 % 100 mL IVPB     1 g 200 mL/hr over 30 Minutes Intravenous Every 24  hours 09/30/19 0013     09/30/19 0830  ceFEPIme (MAXIPIME) 2 g in sodium chloride 0.9 % 100 mL IVPB  Status:  Discontinued     2 g 200 mL/hr over 30 Minutes Intravenous Every 12 hours 09/29/19 2104 09/30/19 0013   09/29/19 2015  ceFEPIme (MAXIPIME) 2 g in sodium chloride 0.9 % 100 mL IVPB     2 g 200 mL/hr over 30 Minutes Intravenous  Once 09/29/19 2009 09/29/19 2054   09/29/19 2015  metroNIDAZOLE (FLAGYL) IVPB 500 mg     500 mg 100 mL/hr over 60 Minutes Intravenous  Once 09/29/19 2009 09/29/19 2118   09/29/19 2015  vancomycin (VANCOCIN) IVPB 1000 mg/200 mL premix  Status:  Discontinued     1,000 mg 200 mL/hr over 60 Minutes Intravenous  Once 09/29/19 2009 09/29/19 2011   09/29/19 2015  vancomycin (VANCOREADY) IVPB 1250 mg/250 mL     1,250 mg 166.7 mL/hr over 90 Minutes Intravenous  Once 09/29/19 2011 09/29/19 2221      Subjective  The patient is lying quietly with her eyes closed. She will not follow commands or respond verbally to me. She is no longer rigoring.  Objective   Vitals:  Vitals:   10/01/19 1431 10/01/19 1438  BP: (!) 97/59 98/85  Pulse: 79 90  Resp: 19 17  Temp: 98 F (36.7 C)   SpO2: 97% 97%   Exam:  Constitutional:  . The patient is lying  with her eyes closed. She does stir to stimulation, but is non-verbal. No acute distress. Respiratory:  . No increased work of breathing. . No wheezes, rales, or rhonchi . No tactile fremitus Cardiovascular:  . Regular rate and rhythm . No murmurs, ectopy, or gallups. . No lateral PMI. No thrills. Abdomen:  . Abdomen is soft, non-tender, non-distended . No hernias, masses, or organomegaly . Normoactive bowel sounds.  Musculoskeletal:  . No cyanosis, clubbing, or edema Skin:  . No rashes, lesions, ulcers . palpation of skin: no induration or nodules Neurologic:  . CN 2-12 intact . Sensation all 4 extremities intact Psychiatric:  . Mental status o Mood, affect appropriate o Orientation to person, place, time   . judgment and insight appear intact  I have personally reviewed the following:   Today's Data  . Vitals, CMP, CBC, troponin . EKG pending  Scheduled Meds: . Chlorhexidine Gluconate Cloth  6 each Topical Q0600  . divalproex  250 mg Oral TID  . [START ON 10/02/2019] enoxaparin (LOVENOX) injection  40 mg Subcutaneous Q24H  . fluPHENAZine  1 mg Oral QHS  . Melatonin  3 mg Oral QHS  . mirtazapine  15 mg Oral QHS  . mupirocin ointment  1 application Nasal BID  . rivastigmine  13.3 mg Transdermal Q breakfast  . sertraline  75 mg Oral Daily   Continuous Infusions: . sodium chloride 75 mL/hr at 10/01/19 0436  . 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 Received 1.75 L in ED with improvement of BP. Blood pressures remain low. Will give another bolus and continue maintenance fluids. The patient is not taking much PO.   COVID viral infection: Not hypoxic on admit or today. LDH elevated at 271, Ferritin WNL at 199, CRP elevated at 4.7. D Dimer at 2.2. Monitor inflammatory markers and respiratory status.   Hypokalemia: Supplement and monitor.  UTI vs asymptomatic bacteriuria: Pt unable to give symptoms due to dementia. Continue IV Rocephine. Urine culture has had no growth. Monitor.  Elevated anion gap: Suspect due to infection, hypovolemia.  Continue IV fluids. Monitor  Elevated troponin:  Elevated from 714 to >1400- no acute EKG changes. Discussed with cardiology fellow Dr. Tressie Sullivan. It is suspected most likely to be due to  demand ischemic from COVID infection and hypotension. They were elevated again at 1800 last night. EKG has been ordered as well as further troponins. Will also order an echocardiogram. Pt is not a good candidate for invasive intervention given severe dementia, non functional at baseline.   Dementia with mood disorder: Per her husband at baseline she is not conversational and wheelchair bound. Appears to be at her baseline on admit. Continue rivastigmine patch.  Dysphagia: Chronic. SLP has placed the patient on a DYS 1 diet with pureed foods and thickened liquids. However she has revisited the patient today. She feels that the patient's dysphagia is a poor prognosis. Her husband states that this is what she eats at the care center where she resides.   Paroxysmal A. Fib:  Not on anticoagulation due to hx of falls  Chronic depression/anxiety: Resume outpatient medicationswhen able to swallow safely.  Prior history of CVA with resultant dysphagia as stated above. Per husband patient is mostly bedbound/wheelchair .  I have seen and examined this patient myself.  I have spent 36 minutes in her evaluation and care. More than 50% of this was spent in sounseling with the patient's husband Earlene Plater. All questions answered to the best of my ability.  DVT prophylaxis:.Lovenox Code Status: DNR- presented with Goldenrod and confirmed with husband Family Communication: I have discussed with the patient over the phone. disposition Plan: Husband updated over the phone.  Toi Stelly, DO [facility] Direct contact: see [email or web address]  7PM-7AM contact night coverage as above 10/01/2019, 4:09 PM  LOS: 1 day

## 2019-10-01 NOTE — Plan of Care (Signed)

## 2019-10-01 NOTE — Progress Notes (Signed)
SLP Cancellation Note  Patient Details Name: Toni Sullivan MRN: [id number] DOB: [birth year]   Cancelled treatment:        Seen in chart that pt is less responsive, rapid response called. Placed pt on honey thick and puree yesterday. Her prognosis for swallow is poor and appeared that overall status was guarded even yesterday. ST will sign off at present. Please reconsult if needed further.    Royce Macadamia 10/01/2019, 10:04 AM   Breck Coons Lonell Face.Ed Nurse, children's [phone] Office [phone]

## 2019-10-02 ENCOUNTER — Inpatient Hospital Stay (HOSPITAL_COMMUNITY): Payer: PPO

## 2019-10-02 DIAGNOSIS — I480 Paroxysmal atrial fibrillation: Secondary | ICD-10-CM

## 2019-10-02 LAB — BASIC METABOLIC PANEL
Anion gap: 7 (ref 5–15)
BUN: 17 mg/dL (ref 8–23)
CO2: 23 mmol/L (ref 22–32)
Calcium: 8.1 mg/dL — ABNORMAL LOW (ref 8.9–10.3)
Chloride: 118 mmol/L — ABNORMAL HIGH (ref 98–111)
Creatinine, Ser: 0.61 mg/dL (ref 0.44–1.00)
GFR calc Af Amer: >60 mL/min (ref >60)
GFR calc non Af Amer: >60 mL/min (ref >60)
Glucose, Bld: 93 mg/dL (ref 70–99)
Potassium: 3.9 mmol/L (ref 3.5–5.1)
Sodium: 148 mmol/L — ABNORMAL HIGH (ref 135–145)

## 2019-10-02 LAB — CBC WITH DIFFERENTIAL/PLATELET
Abs Immature Granulocytes: 0.02 10*3/uL (ref 0.00–0.07)
Basophils Absolute: 0 10*3/uL (ref 0.0–0.1)
Basophils Relative: 1 %
Eosinophils Absolute: 0.1 10*3/uL (ref 0.0–0.5)
Eosinophils Relative: 1 %
HCT: 35.4 % — ABNORMAL LOW (ref 36.0–46.0)
Hemoglobin: 11.6 g/dL — ABNORMAL LOW (ref 12.0–15.0)
Immature Granulocytes: 0 %
Lymphocytes Relative: 51 %
Lymphs Abs: 3 10*3/uL (ref 0.7–4.0)
MCH: 28.1 pg (ref 26.0–34.0)
MCHC: 32.8 g/dL (ref 30.0–36.0)
MCV: 85.7 fL (ref 80.0–100.0)
Monocytes Absolute: 0.4 10*3/uL (ref 0.1–1.0)
Monocytes Relative: 7 %
Neutro Abs: 2.4 10*3/uL (ref 1.7–7.7)
Neutrophils Relative %: 40 %
Platelets: 175 10*3/uL (ref 150–400)
RBC: 4.13 MIL/uL (ref 3.87–5.11)
RDW: 13.3 % (ref 11.5–15.5)
WBC: 5.9 10*3/uL (ref 4.0–10.5)
nRBC: 0 % (ref 0.0–0.2)

## 2019-10-02 LAB — ECHOCARDIOGRAM LIMITED
Height: 64 in
Weight: 1920 oz

## 2019-10-02 MED ORDER — SODIUM CHLORIDE 0.9 % IV BOLUS
1000.0000 mL | Freq: Once | INTRAVENOUS | Status: AC
  Administered 2019-10-02: 1000 mL via INTRAVENOUS

## 2019-10-02 NOTE — Plan of Care (Signed)

## 2019-10-02 NOTE — Progress Notes (Signed)
PROGRESS NOTE  Toni Sullivan QJJ:[id number] DOB: 22-Jun-1951 DOA: 09/29/2019 PCP: Excell Seltzer, MD  Brief History   Toni Sullivan is a 69 y.o. female with medical history significant Alzheimer's dementia, CVA, depression/anxiety who presented from nursing home for concerns of  cyanosis and responsiveness only to noxious stimuli. Found to have SBP of 76. Fever of 103.  Patient is unable to provide history.  Per husband she has incoherent speech at baseline and is bedbound/wheelchair bound.  He is unable to provide any more history regarding her symptoms since he has not seen her at the nursing facility for some time.   On arrival to ED, She was febrile up to 100.3 and hypotensive with systolic down to 48J. This improved to about systolic of 102 after 1.75L of IV fluid. Lactate was 2.9, Anion gap of 16, UA positive for UTI. She tested positive for COVID19. Troponin was elevated and continued to increase. This was discussed with cardiology who felt that it was due to demand ischemia in the setting of acute illness. They feel that the patient is not a good candidate for invasive procedures and recommend an echocardiogram if her troponins continue to increase. They increased to 1800 on 10/01/2019. Echo has been performed, and report is pending.   She was started on broad spectrum antibiotics with IV cefepime, vancomycin and flagyl prior to COVID results returning positive.  She has been admitted to a telemetry bed. She also has been found to have a UTI for which she is receiving ceftriaxone. She has shaking chills. Flagyl, Cefepime, and vancomycin have been discontinued.   There was a rapid response called on the evening of 10/01/2019 due to hypotension. She was given a fluid bolus. This is much improved following another bolus.  Consultants  . None  Procedures  . None  Antibiotics   Anti-infectives (From admission, onward)   Start     Dose/Rate Route Frequency Ordered Stop   09/30/19  2100  vancomycin (VANCOCIN) IVPB 1000 mg/200 mL premix  Status:  Discontinued     1,000 mg 200 mL/hr over 60 Minutes Intravenous Every 24 hours 09/29/19 2104 09/30/19 0013   09/30/19 1000  cefTRIAXone (ROCEPHIN) 1 g in sodium chloride 0.9 % 100 mL IVPB     1 g 200 mL/hr over 30 Minutes Intravenous Every 24 hours 09/30/19 0013     09/30/19 0830  ceFEPIme (MAXIPIME) 2 g in sodium chloride 0.9 % 100 mL IVPB  Status:  Discontinued     2 g 200 mL/hr over 30 Minutes Intravenous Every 12 hours 09/29/19 2104 09/30/19 0013   09/29/19 2015  ceFEPIme (MAXIPIME) 2 g in sodium chloride 0.9 % 100 mL IVPB     2 g 200 mL/hr over 30 Minutes Intravenous  Once 09/29/19 2009 09/29/19 2054   09/29/19 2015  metroNIDAZOLE (FLAGYL) IVPB 500 mg     500 mg 100 mL/hr over 60 Minutes Intravenous  Once 09/29/19 2009 09/29/19 2118   09/29/19 2015  vancomycin (VANCOCIN) IVPB 1000 mg/200 mL premix  Status:  Discontinued     1,000 mg 200 mL/hr over 60 Minutes Intravenous  Once 09/29/19 2009 09/29/19 2011   09/29/19 2015  vancomycin (VANCOREADY) IVPB 1250 mg/250 mL     1,250 mg 166.7 mL/hr over 90 Minutes Intravenous  Once 09/29/19 2011 09/29/19 2221      Subjective  The patient is lying in bed with her eyes open with unintelligible speech. She is not interactive with me, but more responsive/alert  than yesterday.  Objective   Vitals:  Vitals:   10/02/19 1157 10/02/19 1200  BP: (!) 107/92 122/85  Pulse: 66 72  Resp: 14 17  Temp:    SpO2: 96% 93%   Exam:  Constitutional:  The patient is lying in bed with her eyes open with unintelligible speech. She is not interactive with me, but more responsive/alert than yesterday. .  No acute distress. Respiratory:  . No increased work of breathing. . No wheezes, rales, or rhonchi . No tactile fremitus Cardiovascular:  . Regular rate and rhythm . No murmurs, ectopy, or gallups. . No lateral PMI. No thrills. Abdomen:  . Abdomen is soft, non-tender,  non-distended . No hernias, masses, or organomegaly . Normoactive bowel sounds.  Musculoskeletal:  . No cyanosis, clubbing, or edema Skin:  . No rashes, lesions, ulcers . palpation of skin: no induration or nodules Neurologic:  . CN 2-12 intact . Sensation all 4 extremities intact Psychiatric:  . Mental status o Mood, affect appropriate o Orientation to person, place, time  . judgment and insight appear intact  I have personally reviewed the following:   Today's Data  . Vitals, CMP, CBC, troponin . EKG pending  Scheduled Meds: . aspirin  325 mg Oral Daily  . Chlorhexidine Gluconate Cloth  6 each Topical Q0600  . divalproex  250 mg Oral TID  . enoxaparin (LOVENOX) injection  40 mg Subcutaneous Q24H  . fluPHENAZine  1 mg Oral QHS  . Melatonin  3 mg Oral QHS  . metoprolol tartrate  12.5 mg Oral BID  . mirtazapine  15 mg Oral QHS  . mupirocin ointment  1 application Nasal BID  . rivastigmine  13.3 mg Transdermal Q breakfast  . sertraline  75 mg Oral Daily   Continuous Infusions: . sodium chloride 75 mL/hr at 10/02/19 0600  . 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 Received 1.75 L in ED with improvement of BP. Blood pressures are improved today after starting a 1L bolus of NS. Mental status is also improved. Monitor.  COVID viral infection: Not hypoxic on admit or today. LDH elevated at 271, Ferritin WNL at 199, CRP elevated at 4.7. D Dimer at 2.2. Monitor inflammatory markers and respiratory status.    Hypokalemia: Resolved. Monitor.  UTI vs asymptomatic bacteriuria: Pt unable to give symptoms due to dementia. Continue IV Rocephine. Urine culture has had no growth. Monitor.  Elevated anion gap: Resolved.  Elevated troponin:  Elevated from 714 to >1400 >1800. Down to 964 yesterday afternoon. This was discussed with cardiology fellow Dr. Larence Penning by the admitting physician. It was felt most likely to be due to demand ischemic from COVID infection and hypotension. They were elevated again at 1800 on 09/30/2019, but are down to 964 last night.Echocardiogram has been performed, but report is pending. Pt is not a good candidate for invasive intervention given severe dementia, non functional at baseline.   Dementia with mood disorder: Per her husband at baseline she is not conversational and wheelchair bound. Appears to  be at her baseline on admit. Continue rivastigmine patch. She is more alert today, but without intelligible speech. Per her husband, this is her baseline.  Dysphagia: Chronic. SLP has placed the patient on a DYS 1 diet with pureed foods and thickened liquids. However she has revisited the patient today. She feels that the patient's dysphagia is a poor prognosis. Her husband states that this is what she eats at the care center where she resides.   Paroxysmal A. Fib: Not on anticoagulation due to hx of falls  Chronic depression/anxiety: Resume outpatient medicationswhen able to swallow safely.  Prior history of CVA with resultant dysphagia as stated above. Per husband patient is mostly bedbound/wheelchair .  I have seen and examined this patient myself.  I have spent 36 minutes in her evaluation and care. More than 50% of this was spent in sounseling with the patient's husband Toni Sullivan. All questions answered to the best of my ability.  DVT prophylaxis:.Lovenox Code Status: DNR- presented with [facility] and confirmed with husband Family Communication: I have discussed with the  patient over the phone. Disposition Plan: The husband wishes for the patient to return to the facility from which she came. I have consulted TOC for this. She should be ready to go tomorrow or the next day.  Toni Ranum, DO [facility] Direct contact: see [email or web address]  7PM-7AM contact night coverage as above 10/02/2019, 2:12 PM  LOS: 1 day

## 2019-10-02 NOTE — Progress Notes (Signed)
  Echocardiogram 2D Echocardiogram has been performed.  Toni Sullivan 10/02/2019, 10:20 AM

## 2019-10-03 DIAGNOSIS — I9589 Other hypotension: Secondary | ICD-10-CM

## 2019-10-03 DIAGNOSIS — I5181 Takotsubo syndrome: Secondary | ICD-10-CM

## 2019-10-03 LAB — BASIC METABOLIC PANEL
Anion gap: 7 (ref 5–15)
BUN: 15 mg/dL (ref 8–23)
CO2: 20 mmol/L — ABNORMAL LOW (ref 22–32)
Calcium: 7.8 mg/dL — ABNORMAL LOW (ref 8.9–10.3)
Chloride: 116 mmol/L — ABNORMAL HIGH (ref 98–111)
Creatinine, Ser: 0.59 mg/dL (ref 0.44–1.00)
GFR calc Af Amer: >60 mL/min (ref >60)
GFR calc non Af Amer: >60 mL/min (ref >60)
Glucose, Bld: 123 mg/dL — ABNORMAL HIGH (ref 70–99)
Potassium: 3.3 mmol/L — ABNORMAL LOW (ref 3.5–5.1)
Sodium: 143 mmol/L (ref 135–145)

## 2019-10-03 MED ORDER — SODIUM CHLORIDE 0.9 % IV BOLUS
500.0000 mL | Freq: Once | INTRAVENOUS | Status: AC
  Administered 2019-10-03: 500 mL via INTRAVENOUS

## 2019-10-03 NOTE — Progress Notes (Signed)
Spoke with patient's husband and updated him on his wife.

## 2019-10-03 NOTE — Progress Notes (Signed)
Dr. Gerri Lins notified of B/P 75/57. Bolus ordered

## 2019-10-03 NOTE — Progress Notes (Signed)
MEWs is yellow due to hypotension. This has been an ongoing issue and MD is aware.

## 2019-10-03 NOTE — Progress Notes (Signed)
PROGRESS NOTE  TIONA RUANE OFB:[id number] DOB: 06-09-1951 DOA: 09/29/2019 PCP: Jinny Sanders, MD  Brief History   Toni Sullivan is a 69 y.o. female with medical history significant Alzheimer's dementia, CVA, depression/anxiety who presented from nursing home for concerns of  cyanosis and responsiveness only to noxious stimuli. Found to have SBP of 76. Fever of 103.  Patient is unable to provide history.  Per husband she has incoherent speech at baseline and is bedbound/wheelchair bound.  He is unable to provide any more history regarding her symptoms since he has not seen her at the nursing facility for some time.   On arrival to ED, She was febrile up to 100.3 and hypotensive with systolic down to 78E. This improved to about systolic of 423 after 5.36R of IV fluid. Lactate was 2.9, Anion gap of 16, UA positive for UTI. She tested positive for COVID19. Troponin was elevated and continued to increase. This was discussed with cardiology who felt that it was due to demand ischemia in the setting of acute illness. They feel that the patient is not a good candidate for invasive procedures and recommend an echocardiogram if her troponins continue to increase. They increased to 1800 on 10/01/2019. Echo has been performed, and report is pending.   She was started on broad spectrum antibiotics with IV cefepime, vancomycin and flagyl prior to COVID results returning positive.  She has been admitted to a telemetry bed. She also has been found to have a UTI for which she is receiving ceftriaxone. She has shaking chills. Flagyl, Cefepime, and vancomycin have been discontinued.   There was a rapid response called on the evening of 10/01/2019 due to hypotension. She was given a fluid bolus. This is much improved following another bolus.  Report for echocardiogram performed on 10/02/2019 is available and reveals EF of 35-40% with likely takasubo's as the cause. She also has wall motion abnormalities and an  outflow tract obstruction. Cardiology was consulted. I have discussed the patient with Dr. Acie Fredrickson who states that due to her hypotension she is not a candidate for ACE I or beta blocker. Due to her advanced dementia and poor functional status she is not a candidate for intervention, ie LHC. I appreciate his assistance.  Consultants  . None  Procedures  . None  Antibiotics   Anti-infectives (From admission, onward)   Start     Dose/Rate Route Frequency Ordered Stop   09/30/19 2100  vancomycin (VANCOCIN) IVPB 1000 mg/200 mL premix  Status:  Discontinued     1,000 mg 200 mL/hr over 60 Minutes Intravenous Every 24 hours 09/29/19 2104 09/30/19 0013   09/30/19 1000  cefTRIAXone (ROCEPHIN) 1 g in sodium chloride 0.9 % 100 mL IVPB     1 g 200 mL/hr over 30 Minutes Intravenous Every 24 hours 09/30/19 0013     09/30/19 0830  ceFEPIme (MAXIPIME) 2 g in sodium chloride 0.9 % 100 mL IVPB  Status:  Discontinued     2 g 200 mL/hr over 30 Minutes Intravenous Every 12 hours 09/29/19 2104 09/30/19 0013   09/29/19 2015  ceFEPIme (MAXIPIME) 2 g in sodium chloride 0.9 % 100 mL IVPB     2 g 200 mL/hr over 30 Minutes Intravenous  Once 09/29/19 2009 09/29/19 2054   09/29/19 2015  metroNIDAZOLE (FLAGYL) IVPB 500 mg     500 mg 100 mL/hr over 60 Minutes Intravenous  Once 09/29/19 2009 09/29/19 2118   09/29/19 2015  vancomycin (VANCOCIN) IVPB 1000 mg/200  mL premix  Status:  Discontinued     1,000 mg 200 mL/hr over 60 Minutes Intravenous  Once 09/29/19 2009 09/29/19 2011   09/29/19 2015  vancomycin (VANCOREADY) IVPB 1250 mg/250 mL     1,250 mg 166.7 mL/hr over 90 Minutes Intravenous  Once 09/29/19 2011 09/29/19 2221      Subjective  The patient is lying in bed with her eyes open with unintelligible speech. She is not interactive with me. She is relatively unchanged from yesterday.  Objective   Vitals:  Vitals:   10/03/19 0800 10/03/19 1037  BP: (!) 89/76 (!) 75/57  Pulse: (!) 59 (!) 116  Resp: 14 14   Temp:    SpO2: 99% 96%   Exam:  Constitutional:  The patient is lying in bed with her eyes open with unintelligible speech. She is not interactive with me. Unchanged from yesterday. .  No acute distress. Respiratory:  . No increased work of breathing. . No wheezes, rales, or rhonchi . No tactile fremitus Cardiovascular:  . Regular rate and rhythm . No murmurs, ectopy, or gallups. . No lateral PMI. No thrills. Abdomen:  . Abdomen is soft, non-tender, non-distended . No hernias, masses, or organomegaly . Normoactive bowel sounds.  Musculoskeletal:  . No cyanosis, clubbing, or edema Skin:  . No rashes, lesions, ulcers . palpation of skin: no induration or nodules Neurologic:  . CN 2-12 intact . Sensation all 4 extremities intact Psychiatric:  . Mental status o Mood, affect appropriate o Orientation to person, place, time  . judgment and insight appear intact  I have personally reviewed the following:   Today's Data  . Vitals, CMP, CBC, troponin . Echocardiagram  Scheduled Meds: . aspirin  325 mg Oral Daily  . Chlorhexidine Gluconate Cloth  6 each Topical Q0600  . divalproex  250 mg Oral TID  . enoxaparin (LOVENOX) injection  40 mg Subcutaneous Q24H  . fluPHENAZine  1 mg Oral QHS  . Melatonin  3 mg Oral QHS  . mirtazapine  15 mg Oral QHS  . mupirocin ointment  1 application Nasal BID  . rivastigmine  13.3 mg Transdermal Q breakfast  . sertraline  75 mg Oral Daily   Continuous Infusions: . sodium chloride 75 mL/hr at 10/03/19 1323  . 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 Received 1.75 L in ED with improvement of BP. Blood pressures are improved today after starting a 1L bolus of NS. Mental status is also improved. Monitor.  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 Pt is not a candidate for any invasive procedure or even use of beta blocker or ACE to treat cardiomyopathy as per Dr. Elease Hashimoto. I appreciate his help.   COVID viral infection: Not hypoxic on admit or today. LDH elevated at 271, Ferritin WNL at 199, CRP elevated at 4.7. D Dimer at 2.2. Monitor inflammatory markers and respiratory status.   Hypokalemia: Resolved. Monitor.  UTI vs asymptomatic bacteriuria: Pt unable to give symptoms due to dementia. Continue IV Rocephine. Urine culture  has had no growth. Monitor.  Elevated anion gap: Resolved.  Elevated troponin:  Elevated from 714 to >1400 >1800. Down to 964 yesterday afternoon. This was discussed with cardiology fellow Dr. Tressie Ellis by the admitting physician. It was felt most likely to be due to demand ischemic from COVID infection and hypotension. They were elevated again at 1800 on 09/30/2019, but are down to 964 last night.Echocardiogram has been performed, but report is pending. Pt is not a good candidate for invasive intervention given severe dementia, non functional at baseline.   Dementia with mood disorder: Per her husband at baseline she is not conversational and wheelchair bound. Appears to be at her baseline on admit. Continue rivastigmine patch. She is more alert today, but without intelligible speech. Per her husband, this is her baseline.  Dysphagia: Chronic. SLP has placed the patient on a DYS 1 diet with pureed foods and thickened liquids. However she has revisited the patient today. She feels that the patient's dysphagia is a poor prognosis. Her husband states that this is what she eats at the care center where she resides.   Paroxysmal A. Fib:  Not on anticoagulation due to hx of falls  Chronic depression/anxiety: Resume outpatient medicationswhen able to swallow safely.  Prior history of CVA with resultant dysphagia as stated above. Per husband patient is mostly bedbound/wheelchair .  I have seen and examined this patient myself.  I have spent 36 minutes in her evaluation and care. More than 50% of this was spent in sounseling with the patient's husband Earlene Plater. All questions answered to the best of my ability.  DVT prophylaxis:.Lovenox Code Status: DNR- presented with [facility] and confirmed with husband Family Communication: I have discussed with the patient over the phone. Disposition Plan: The husband wishes for the patient to return to the facility from which she came. I have consulted TOC for this. However, the patietn continues to be severely hypotensive and now has been diagnosed with takotsubo's cardiomyopathy. Palliative care has been consulted as discharge with hospice may be the most appropriate disposition.  Amauria Younts, DO [facility] Direct contact: see [email or web address]  7PM-7AM contact night coverage as above 10/03/2019, 3:54 PM  LOS: 1 day

## 2019-10-03 NOTE — Plan of Care (Signed)
  Problem: Clinical Measurements: Goal: Will remain free from infection Outcome: Progressing Goal: Respiratory complications will improve Outcome: Progressing Goal: Cardiovascular complication will be avoided Outcome: Progressing   Problem: Nutrition: Goal: Adequate nutrition will be maintained Outcome: Progressing   Problem: Clinical Measurements: Goal: Will remain free from infection Outcome: Progressing

## 2019-10-03 NOTE — Consult Note (Signed)
Cardiology Consultation:   Patient ID: ANMOL PASCHEN MRN: [id number]; DOB: Feb 25, 1951  Admit date: 09/29/2019 Date of Consult: 10/03/2019  Primary Care Provider: Excell Seltzer, MD Primary Cardiologist: Luvenia Heller  Primary Electrophysiologist:  None    Patient Profile:   Toni Sullivan is a 69 y.o. female with a hx of  Severe dementia  who is being seen today for the evaluation of  CHF  at the request of  Dr. Gerri Lins .  History of Present Illness:   Ms. Dethlefs has a long history of vascular dementia.  She is minimally functional according to Dr. Gerri Lins.  She is admitted from a nursing facility with COVID-19.  She is bedbound/wheelchair bound.  She is unable to provide any history.  On admission she was found to have an elevated troponin level.  The troponin peaked at 1800 and is now trending down.  Echocardiogram reveals apical severe hypokinesis/akinesis consistent with either Takotsubo syndrome or a large anteroapical myocardial infarction.  She is unable to provide any history as to whether or not she has had any chest pain.  I have personally reviewed the echocardiogram and agree that this is likely Takotsubo syndrome.  Heart Pathway Score:     Past Medical History:  Diagnosis Date  . Dementia (HCC)   . Headache(784.0)   . Memory loss   . Stroke [facility])     Past Surgical History:  Procedure Laterality Date  . MOUTH SURGERY    . SKIN CANCER EXCISION       Home Medications:  Prior to Admission medications   Medication Sig Start Date End Date Taking? Authorizing Provider  acetaminophen (TYLENOL) 325 MG tablet Take 650 mg by mouth every 8 (eight) hours as needed (pain level 1-8 or temp greater than 100 deg F).   Yes [provider]  acetaminophen (TYLENOL) 500 MG tablet Take 1 tablet (500 mg total) by mouth every 6 (six) hours as needed for mild pain, moderate pain or fever. Patient taking differently: Take 500 mg by mouth every 4 (four) hours as needed for  headache (minor discomfort and for fever 99.5-101 deg F).  07/31/18  Yes Hongalgi, Maximino Greenland, MD  alum & mag hydroxide-simeth (MINTOX) 200-200-20 MG/5ML suspension Take 30 mLs by mouth 4 (four) times daily as needed for indigestion or heartburn. Do not exceed 4 doses in 24 hours    Yes [provider]  divalproex (DEPAKOTE SPRINKLE) 125 MG capsule Take 250 mg by mouth 3 (three) times daily.   Yes [provider]  fluPHENAZine (PROLIXIN) 1 MG tablet Take 1 mg by mouth at bedtime. 08/01/17  Yes [provider]  furosemide (LASIX) 20 MG tablet Take 1 tablet (20 mg total) by mouth every other day. 07/31/18  Yes Hongalgi, Maximino Greenland, MD  guaifenesin (ROBITUSSIN) 100 MG/5ML syrup Take 200 mg by mouth every 6 (six) hours as needed for cough.   Yes [provider]  loperamide (IMODIUM) 2 MG capsule Take 2 mg by mouth as needed for diarrhea or loose stools. Do not exceed 8 doses in 24 hours   Yes [provider]  LORazepam (ATIVAN) 0.5 MG tablet Take 1 tablet (0.5 mg total) by mouth every 8 (eight) hours as needed for anxiety. Patient taking differently: Take 0.5 mg by mouth 2 (two) times daily. For anxiety and agitation 07/31/18  Yes Hongalgi, Maximino Greenland, MD  magnesium hydroxide (MILK OF MAGNESIA) 400 MG/5ML suspension Take 30 mLs by mouth at bedtime as needed for mild  constipation.   Yes [provider]  Melatonin 3 MG TABS Take 3 mg by mouth at bedtime.   Yes [provider]  mirtazapine (REMERON) 15 MG tablet Take 15 mg by mouth at bedtime.   Yes [provider]  Neomycin-Bacitracin-Polymyxin (TRIPLE ANTIBIOTIC) [phone] OINT Apply 1 application topically as needed (minor skin tears or abrasions).   Yes [provider]  Rivastigmine (EXELON) 13.3 MG/24HR PT24 Apply 1 patch (13.3 mg total) topically daily. 09/01/16  Yes Nilda Riggs, NP  sertraline (ZOLOFT) 25 MG tablet Take 75 mg by mouth daily. 07/24/17  Yes [provider]    Inpatient Medications: Scheduled Meds: . aspirin  325 mg Oral Daily  . Chlorhexidine Gluconate Cloth  6 each Topical Q0600  . divalproex  250 mg Oral TID  . enoxaparin (LOVENOX) injection  40 mg Subcutaneous Q24H  . fluPHENAZine  1 mg Oral QHS  . Melatonin  3 mg Oral QHS  . mirtazapine  15 mg Oral QHS  . mupirocin ointment  1 application Nasal BID  . rivastigmine  13.3 mg Transdermal Q breakfast  . sertraline  75 mg Oral Daily   Continuous Infusions: . sodium chloride 75 mL/hr at 10/03/19 1323  . cefTRIAXone (ROCEPHIN)  IV 1 g (10/03/19 0808)   PRN Meds: LORazepam, Resource ThickenUp Clear  Allergies:    Allergies  Allergen Reactions  . Penicillins Other (See Comments)    Has patient had a PCN reaction causing immediate rash, facial/tongue/throat swelling, SOB or lightheadedness with hypotension: Unkown PCN reaction causing severe rash involving mucus membranes or skin necrosis: Unknown PCN reaction that required hospitalization: Unknown PCN reaction occurring within the last 10 years: No If all of the above answers are "NO", then may proceed with Cephalosporin use. Tolerated Cefepime, Ceftriaxone (06/2018)     Social History:   Social History   Socioeconomic History  . Marital status: Married    Spouse name: Toni Sullivan  . Number of children: 2  . Years of education: 28  . Highest education level: Not on file  Occupational History  . Occupation: retired    Associate Professor: UNEMPLOYED  Tobacco Use  . Smoking status: Never Smoker  . Smokeless tobacco: Never Used  Substance and Sexual Activity  . Alcohol use: Yes    Comment: 1 glass daily  . Drug use: No  . Sexual activity: Never  Other Topics Concern  . Not on file  Social History Narrative   Patient is married Toni Sullivan) and lives with her husband.   Patient has two children.   Patient is retired.   Patient has a high school education.   Patient is right-handed.   Patient drinks 5-6 cups of coffee  daily.      Social Determinants of Health   Financial Resource Strain:   . Difficulty of Paying Living Expenses: Not on file  Food Insecurity:   . Worried About Programme researcher, broadcasting/film/video in the Last Year: Not on file  . Ran Out of Food in the Last Year: Not on file  Transportation Needs:   . Lack of Transportation (Medical): Not on file  . Lack of Transportation (Non-Medical): Not on file  Physical Activity:   . Days of Exercise per Week: Not on file  . Minutes of Exercise per Session: Not on file  Stress:   . Feeling of Stress : Not on file  Social Connections:   . Frequency of Communication with Friends and Family: Not on file  . Frequency  of Social Gatherings with Friends and Family: Not on file  . Attends Religious Services: Not on file  . Active Member of Clubs or Organizations: Not on file  . Attends Banker Meetings: Not on file  . Marital Status: Not on file  Intimate Partner Violence:   . Fear of Current or Ex-Partner: Not on file  . Emotionally Abused: Not on file  . Physically Abused: Not on file  . Sexually Abused: Not on file    Family History:   History reviewed. No pertinent family history.  Pt is unable to provide any family hx.  ROS:  Please see the history of present illness.   All other ROS reviewed and negative.     Physical Exam/Data:   Vitals:   10/02/19 1943 10/03/19 0445 10/03/19 0800 10/03/19 1037  BP: 99/72 (!) 81/60 (!) 89/76 (!) 75/57  Pulse:  (!) 52 (!) 59 (!) 116  Resp:  (!) 21 14 14   Temp: 98 F (36.7 C) 98.5 F (36.9 C)    TempSrc: Oral Oral    SpO2:  99% 99% 96%  Weight:      Height:        Intake/Output Summary (Last 24 hours) at 10/03/2019 1404 Last data filed at 10/03/2019 0800 Gross per 24 hour  Intake 1903.19 ml  Output 1300 ml  Net 603.19 ml   Last 3 Weights 09/29/2019 07/22/2018 06/24/2017  Weight (lbs) 120 lb 145 lb 142 lb  Weight (kg) 54.432 kg 65.772 kg 64.411 kg     Body mass index is 20.6 kg/m.     \ No inperson exam due to Covid  I have discuss with Dr. 06/26/2017   EKG:   NSR .   Mild nonspecific ST changes.  No severe acute changes to suggest a large anteroapical myocardial infarction.  Telemetry:    Relevant CV Studies:   Laboratory Data:  High Sensitivity Troponin:   Recent Labs  Lab 09/30/19 0147 09/30/19 0352 09/30/19 2040 10/01/19 1659 10/01/19 1844  TROPONINIHS 1,371* 1,279* 1,812* 941* 964*     Chemistry Recent Labs  Lab 09/29/19 2008 10/01/19 0239 10/02/19 0501  NA 142 144 148*  K 3.8 3.2* 3.9  CL 102 115* 118*  CO2 24 21* 23  GLUCOSE 171* 99 93  BUN 19 13 17   CREATININE 0.83 0.53 0.61  CALCIUM 9.1 8.0* 8.1*  GFRNONAA >60 >60 >60  GFRAA >60 >60 >60  ANIONGAP 16* 8 7    Recent Labs  Lab 09/29/19 2008  PROT 6.8  ALBUMIN 2.9*  AST 50*  ALT 24  ALKPHOS 82  BILITOT 0.7   Hematology Recent Labs  Lab 09/29/19 2008 10/01/19 0239 10/02/19 0501  WBC 7.3 5.7 5.9  RBC 5.15* 4.02 4.13  HGB 14.4 11.3* 11.6*  HCT 43.3 33.1* 35.4*  MCV 84.1 82.3 85.7  MCH 28.0 28.1 28.1  MCHC 33.3 34.1 32.8  RDW 12.8 12.9 13.3  PLT 166 164 175   BNPNo results for input(s): BNP, PROBNP in the last 168 hours.  DDimer  Recent Labs  Lab 09/29/19 2225  DDIMER 2.02*     Radiology/Studies:  DG Chest Port 1 View  Result Date: 09/29/2019 CLINICAL DATA:  Shortness of breath. EXAM: PORTABLE CHEST 1 VIEW COMPARISON:  July 24, 2018 FINDINGS: The lungs are hyperinflated. There is no evidence of acute infiltrate, pleural effusion or pneumothorax. The heart size and mediastinal contours are within normal limits. Chronic ninth and tenth right rib fractures are seen. The  visualized skeletal structures are otherwise unremarkable. IMPRESSION: No active disease. Electronically Signed   By: Virgina Norfolk M.D.   On: 09/29/2019 20:49   ECHOCARDIOGRAM LIMITED  Result Date: 10/02/2019   ECHOCARDIOGRAM LIMITED REPORT   Patient Name:   INAARA TYE Date of Exam:  10/02/2019 Medical Rec #:  [id number]        Height:       64.0 in Accession #:    [id number]       Weight:       120.0 lb Date of Birth:  01/16/1951         BSA:          1.57 m Patient Age:    79 years         BP:           89/67 mmHg Patient Gender: F                HR:           67 bpm. Exam Location:  Inpatient  Procedure: Limited Echo Indications:    elevated troponin  History:        Patient has prior history of Echocardiogram examinations, most                 recent 07/22/2018.  Sonographer:    Jannett Celestine RDCS (AE) Referring Phys: [id number] AVA SWAYZE  Sonographer Comments: No parasternal window and suboptimal subcostal window. Image acquisition challenging due to uncooperative patient. restricted mobility. see comments IMPRESSIONS  1. Left ventricular ejection fraction, by visual estimation, is 35 to 40%. Akinesis of the apex and midventricle in a noncoronary distribution, with hyperdynamic base. Findings could be consistent with ischemia, however strongly suspect Takotsubo (stress) cardiomyopathy.  2. The left ventricle demonstrates regional wall motion abnormalities.  3. Evidence of LVOT obstruction with flow acceleration at the basal septum, with systolic anterior motion of the mitral valve, with posteriorly directed mitral regurgitation. Maximum instantanteous gradient 16 mmHg (velocity 2 m/s).  4. Global right ventricle has normal systolc function.The right ventricular size is normal.  5. Aortic valve has normal leaflet excursion, suggesting no significant aortic valve stenosis.  6. The inferior vena cava is dilated in size with >50% respiratory variability, suggesting right atrial pressure of 8 mmHg. FINDINGS  Left Ventricle: Left ventricular ejection fraction, by visual estimation, is 35 to 40%. The left ventricle demonstrates regional wall motion abnormalities. Right Ventricle: The right ventricular size is normal. Global RV systolic function is has normal systolic function. Aortic Valve: The aortic  valve is tricuspid. Aortic valve regurgitation is trivial. Venous: The inferior vena cava is dilated in size with greater than 50% respiratory variability, suggesting right atrial pressure of 8 mmHg.  LEFT VENTRICLE          Normals PLAX 2D LVOT diam:     2.00 cm  2.0 cm LVOT Area:     3.14 cm 3.14 cm2   SHUNTS Systemic Diam: 2.00 cm  Cherlynn Kaiser MD Electronically signed by Cherlynn Kaiser MD Signature Date/Time: 10/02/2019/4:11:11 PM    Final      Assessment and Plan:   1. Takotsubo Syndrome:   Echo is c/w takotsubo syndrome.    Her EKG is not suggestive of a large anteroapical myocardial infarction which supports the diagnosis of Takotsubo syndrome.  Her her troponin levels are mildly elevated but certainly not enough to suggest a large anteroapical myocardial infarction.  Her last BP is recorded as 131 / 111 I  would start her on Losartan 50 mg a day  HR is 69.   May not be high enough to add Coreg.   If her HR increases and BP allows, would add Coreg 3.125 po BID Can also add Lasix IV if she has difficulty breathing .   Given her underlying dementia ( wheelchair bound / bed bound ) she is not a candidate for any invasive procedures.         For questions or updates, please contact [facility] Please consult [email or web address] for contact info under     Signed, Kristeen Miss, MD  10/03/2019 2:04 PM

## 2019-10-04 LAB — CBC WITH DIFFERENTIAL/PLATELET
Abs Immature Granulocytes: 0.01 10*3/uL (ref 0.00–0.07)
Basophils Absolute: 0 10*3/uL (ref 0.0–0.1)
Basophils Relative: 1 %
Eosinophils Absolute: 0.1 10*3/uL (ref 0.0–0.5)
Eosinophils Relative: 3 %
HCT: 32.9 % — ABNORMAL LOW (ref 36.0–46.0)
Hemoglobin: 11 g/dL — ABNORMAL LOW (ref 12.0–15.0)
Immature Granulocytes: 0 %
Lymphocytes Relative: 47 %
Lymphs Abs: 2.1 10*3/uL (ref 0.7–4.0)
MCH: 27.8 pg (ref 26.0–34.0)
MCHC: 33.4 g/dL (ref 30.0–36.0)
MCV: 83.3 fL (ref 80.0–100.0)
Monocytes Absolute: 0.2 10*3/uL (ref 0.1–1.0)
Monocytes Relative: 5 %
Neutro Abs: 1.9 10*3/uL (ref 1.7–7.7)
Neutrophils Relative %: 44 %
Platelets: 222 10*3/uL (ref 150–400)
RBC: 3.95 MIL/uL (ref 3.87–5.11)
RDW: 13.5 % (ref 11.5–15.5)
WBC: 4.4 10*3/uL (ref 4.0–10.5)
nRBC: 0 % (ref 0.0–0.2)

## 2019-10-04 LAB — CULTURE, BLOOD (ROUTINE X 2)
Culture: NO GROWTH
Culture: NO GROWTH
Special Requests: ADEQUATE

## 2019-10-04 LAB — URINE CULTURE: Culture: 100 — AB

## 2019-10-04 LAB — BRAIN NATRIURETIC PEPTIDE: B Natriuretic Peptide: 1353.8 pg/mL — ABNORMAL HIGH (ref 0.0–100.0)

## 2019-10-04 MED ORDER — ASPIRIN EC 81 MG PO TBEC: 81.0000 mg | DELAYED_RELEASE_TABLET | Freq: Every day | ORAL | 0 refills | Status: AC

## 2019-10-04 MED ORDER — RESOURCE THICKENUP CLEAR PO POWD: 125.0000 g | ORAL | 0 refills | Status: AC | PRN

## 2019-10-04 MED ORDER — FLUPHENAZINE HCL 1 MG PO TABS: 1.0000 mg | ORAL_TABLET | Freq: Every day | ORAL | 0 refills | Status: AC

## 2019-10-04 MED ORDER — LORAZEPAM 0.5 MG PO TABS: 0.5000 mg | ORAL_TABLET | Freq: Three times a day (TID) | ORAL | 0 refills | Status: AC | PRN

## 2019-10-04 MED ORDER — MUPIROCIN 2 % EX OINT: 1.0000 "application " | TOPICAL_OINTMENT | Freq: Two times a day (BID) | CUTANEOUS | 0 refills | Status: AC

## 2019-10-04 NOTE — TOC Progression Note (Addendum)
Transition of Care [facility]) - Progression Note    Patient Details  Name: Toni Sullivan MRN: [id number] Date of Birth: 1951/05/09  Transition of Care [facility]) CM/SW Contact  Mearl Latin, LCSW Phone Number: 10/04/2019, 5:00 PM  Clinical Narrative:    4:50pm-CSW contacted [facility] again to see if they have reviewed DC Summary/Fl2 yet (faxed to [phone]) and was sent to RN Tammy's voicemail. CSW called back and asked to speak with someone else. Secretary going to go find someone to call CSW back.   7pm-[place] Thelma Barge still will not call CSW back. Weekend CSW to continue to attempt.         Expected Discharge Plan and Services           Expected Discharge Date: 10/04/19                                     Social Determinants of Health (SDOH) Interventions    Readmission Risk Interventions No flowsheet data found.

## 2019-10-04 NOTE — Discharge Summary (Signed)
Physician Discharge Summary  Toni Sullivan ZOX:[id number] DOB: 11/06/1950 DOA: 09/29/2019  PCP: Excell SeltzerBedsole, Amy E, MD  Admit date: 09/29/2019 Discharge date: 10/04/2019  Recommendations for Outpatient Follow-up:  1. DC back to facility of origin with outpatient palliative care consult.  Discharge Diagnoses: Principal diagnosis is #1 1. Sepsis 2. Takotsubo's Cardiomyopathy 3. COVID-19 infection 4. Hypokalemia 5. UTI 6. Elevated anion gap 7. Elevated troponin 8. Dementia 9. Dysphagia 10. Paroxysmal atrial fibrillation 11. Chronic depression/anxiety 12. History of CVA  Discharge Condition: Fair  Disposition: Pt to return to long term care facility of origin.  Diet recommendation: Heart healthy  Filed Weights   09/29/19 1947  Weight: 54.4 kg    History of present illness:  Toni Sullivan is a 69 y.o. female with medical history significant Alzheimer's dementia, CVA, depression/anxiety who presented from nursing home for concerns of  cyanosis and responsiveness only to noxious stimuli. Found to have SBP of 76. Fever of 103.  Patient is unable to provide history.  Per husband she has incoherent speech at baseline and is bedbound/wheelchair bound.  He is unable to provide any more history regarding her symptoms since he has not seen her at the nursing facility for some time.   On arrival to ED, She was febrile up to 100.3 and hypotensive with systolic down to 81X90s. This improved to about systolic of 102 after 1.75L of IV fluid.   WBC of 7.3. Lactate of 2.9. Glucose of 171, creatinine normal at 0.83, anion gap of 16 UA had positive leukocytes and nitrates and few bacteria with >50 WBC. Positive POC COVID test. CXR negative.  Troponin of 714.  She was started on broad spectrum antibiotics with IV cefepime, vancomycin and flagyl prior to COVID results returning positive.  Hospital Course:  She has been admitted to a telemetry bed. She also has been found to have a UTI for  which she is receiving ceftriaxone. She has shaking chills. Flagyl, Cefepime, and vancomycin have been discontinued.   There was a rapid response called on the evening of 10/01/2019 due to hypotension. She was given a fluid bolus. This is much improved following another bolus.  Report for echocardiogram performed on 10/02/2019 is available and reveals EF of 35-40% with likely takasubo's as the cause. She also has wall motion abnormalities and an outflow tract obstruction. Cardiology was consulted. I have discussed the patient with Dr. Elease HashimotoNahser who states that due to her hypotension she is not a candidate for ACE I or beta blocker. Due to her advanced dementia and poor functional status she is not a candidate for intervention, ie LHC. I appreciate his assistance.  Today the patient is stable in terms of blood pressures and oxygen saturation. She is appropriate to return to the facility in which she resided prior to presentation. I have asked that she follow up with palliative care as outpatient.  Today's assessment: S: The patient is lying quietly. No new complaints. O: Vitals:  Vitals:   10/04/19 0804 10/04/19 1351  BP: 108/88 102/78  Pulse:  68  Resp:  10  Temp: 98 F (36.7 C) 98.6 F (37 C)  SpO2: 95% 97%   Exam:  Constitutional:  The patient is lying in bed with her eyes open. She is not phonating today. She is not interactive with me.   No acute distress. Respiratory:   No increased work of breathing.  No wheezes, rales, or rhonchi  No tactile fremitus Cardiovascular:   Regular rate and rhythm  No  murmurs, ectopy, or gallups.  No lateral PMI. No thrills. Abdomen:   Abdomen is soft, non-tender, non-distended  No hernias, masses, or organomegaly  Normoactive bowel sounds.  Musculoskeletal:   No cyanosis, clubbing, or edema Skin:   No rashes, lesions, ulcers  palpation of skin: no induration or nodules Neurologic:   CN 2-12 intact  Sensation all 4  extremities intact Psychiatric:   Mental status ? Mood, affect appropriate ? Orientation to person, place, time   judgment and insight appear intact Discharge Instructions  Discharge Instructions    Activity as tolerated - No restrictions   Complete by: As directed    Amb Referral to Palliative Care   Complete by: As directed    Call MD for:  difficulty breathing, headache or visual disturbances   Complete by: As directed    Call MD for:  severe uncontrolled pain   Complete by: As directed    Call MD for:  temperature >100.4   Complete by: As directed    Diet - low sodium heart healthy   Complete by: As directed    Discharge instructions   Complete by: As directed    DC back to facility of origin with outpatient palliative care consult.   Increase activity slowly   Complete by: As directed    MyChart COVID-19 home monitoring program   Complete by: Oct 04, 2019    Is the patient willing to use the MyChart Mobile App for home monitoring?: No     Allergies as of 10/04/2019      Reactions   Penicillins Other (See Comments)   Has patient had a PCN reaction causing immediate rash, facial/tongue/throat swelling, SOB or lightheadedness with hypotension: Unkown PCN reaction causing severe rash involving mucus membranes or skin necrosis: Unknown PCN reaction that required hospitalization: Unknown PCN reaction occurring within the last 10 years: No If all of the above answers are "NO", then may proceed with Cephalosporin use. Tolerated Cefepime, Ceftriaxone (06/2018)      Medication List    TAKE these medications   acetaminophen 325 MG tablet Commonly known as: TYLENOL Take 650 mg by mouth every 8 (eight) hours as needed (pain level 1-8 or temp greater than 100 deg F). What changed: Another medication with the same name was removed. Continue taking this medication, and follow the directions you see here.   aspirin EC 81 MG tablet Take 1 tablet (81 mg total) by mouth daily.     divalproex 125 MG capsule Commonly known as: DEPAKOTE SPRINKLE Take 250 mg by mouth 3 (three) times daily.   fluPHENAZine 1 MG tablet Commonly known as: PROLIXIN Take 1 tablet (1 mg total) by mouth at bedtime.   furosemide 20 MG tablet Commonly known as: Lasix Take 1 tablet (20 mg total) by mouth every other day.   guaifenesin 100 MG/5ML syrup Commonly known as: ROBITUSSIN Take 200 mg by mouth every 6 (six) hours as needed for cough.   loperamide 2 MG capsule Commonly known as: IMODIUM Take 2 mg by mouth as needed for diarrhea or loose stools. Do not exceed 8 doses in 24 hours   LORazepam 0.5 MG tablet Commonly known as: ATIVAN Take 1 tablet (0.5 mg total) by mouth every 8 (eight) hours as needed for anxiety. What changed:   when to take this  reasons to take this  additional instructions   magnesium hydroxide 400 MG/5ML suspension Commonly known as: MILK OF MAGNESIA Take 30 mLs by mouth at bedtime as needed for  mild constipation.   Melatonin 3 MG Tabs Take 3 mg by mouth at bedtime.   Mintox 009-381-82 MG/5ML suspension Generic drug: alum & mag hydroxide-simeth Take 30 mLs by mouth 4 (four) times daily as needed for indigestion or heartburn. Do not exceed 4 doses in 24 hours   mirtazapine 15 MG tablet Commonly known as: REMERON Take 15 mg by mouth at bedtime.   mupirocin ointment 2 % Commonly known as: BACTROBAN Place 1 application into the nose 2 (two) times daily.   Resource ThickenUp Clear Powd Take 125 g by mouth as needed.   rivastigmine 13.3 MG/24HR Commonly known as: Exelon Apply 1 patch (13.3 mg total) topically daily.   sertraline 25 MG tablet Commonly known as: ZOLOFT Take 75 mg by mouth daily.   Triple Antibiotic [phone] Oint Apply 1 application topically as needed (minor skin tears or abrasions).      Allergies  Allergen Reactions  . Penicillins Other (See Comments)    Has patient had a PCN reaction causing immediate rash,  facial/tongue/throat swelling, SOB or lightheadedness with hypotension: Unkown PCN reaction causing severe rash involving mucus membranes or skin necrosis: Unknown PCN reaction that required hospitalization: Unknown PCN reaction occurring within the last 10 years: No If all of the above answers are "NO", then may proceed with Cephalosporin use. Tolerated Cefepime, Ceftriaxone (06/2018)     The results of significant diagnostics from this hospitalization (including imaging, microbiology, ancillary and laboratory) are listed below for reference.    Significant Diagnostic Studies: DG Chest Port 1 View  Result Date: 09/29/2019 CLINICAL DATA:  Shortness of breath. EXAM: PORTABLE CHEST 1 VIEW COMPARISON:  July 24, 2018 FINDINGS: The lungs are hyperinflated. There is no evidence of acute infiltrate, pleural effusion or pneumothorax. The heart size and mediastinal contours are within normal limits. Chronic ninth and tenth right rib fractures are seen. The visualized skeletal structures are otherwise unremarkable. IMPRESSION: No active disease. Electronically Signed   By: Virgina Norfolk M.D.   On: 09/29/2019 20:49   ECHOCARDIOGRAM LIMITED  Result Date: 10/02/2019   ECHOCARDIOGRAM LIMITED REPORT   Patient Name:   Toni Sullivan Date of Exam: 10/02/2019 Medical Rec #:  [id number]        Height:       64.0 in Accession #:    [id number]       Weight:       120.0 lb Date of Birth:  September 05, 1950         BSA:          1.57 m Patient Age:    61 years         BP:           89/67 mmHg Patient Gender: F                HR:           67 bpm. Exam Location:  Inpatient  Procedure: Limited Echo Indications:    elevated troponin  History:        Patient has prior history of Echocardiogram examinations, most                 recent 07/22/2018.  Sonographer:    Jannett Celestine RDCS (AE) Referring Phys: [id number] Breindel Collier  Sonographer Comments: No parasternal window and suboptimal subcostal window. Image acquisition challenging  due to uncooperative patient. restricted mobility. see comments IMPRESSIONS  1. Left ventricular ejection fraction, by visual estimation, is 35 to 40%. Akinesis of the apex  and midventricle in a noncoronary distribution, with hyperdynamic base. Findings could be consistent with ischemia, however strongly suspect Takotsubo (stress) cardiomyopathy.  2. The left ventricle demonstrates regional wall motion abnormalities.  3. Evidence of LVOT obstruction with flow acceleration at the basal septum, with systolic anterior motion of the mitral valve, with posteriorly directed mitral regurgitation. Maximum instantanteous gradient 16 mmHg (velocity 2 m/s).  4. Global right ventricle has normal systolc function.The right ventricular size is normal.  5. Aortic valve has normal leaflet excursion, suggesting no significant aortic valve stenosis.  6. The inferior vena cava is dilated in size with >50% respiratory variability, suggesting right atrial pressure of 8 mmHg. FINDINGS  Left Ventricle: Left ventricular ejection fraction, by visual estimation, is 35 to 40%. The left ventricle demonstrates regional wall motion abnormalities. Right Ventricle: The right ventricular size is normal. Global RV systolic function is has normal systolic function. Aortic Valve: The aortic valve is tricuspid. Aortic valve regurgitation is trivial. Venous: The inferior vena cava is dilated in size with greater than 50% respiratory variability, suggesting right atrial pressure of 8 mmHg.  LEFT VENTRICLE          Normals PLAX 2D LVOT diam:     2.00 cm  2.0 cm LVOT Area:     3.14 cm 3.14 cm2   SHUNTS Systemic Diam: 2.00 cm  Weston Brass MD Electronically signed by Weston Brass MD Signature Date/Time: 10/02/2019/4:11:11 PM    Final     Microbiology: Recent Results (from the past 240 hour(s))  Blood Culture (routine x 2)     Status: None   Collection Time: 09/29/19  8:00 PM   Specimen: BLOOD LEFT WRIST  Result Value Ref Range Status    Specimen Description BLOOD LEFT WRIST  Final   Special Requests   Final    BOTTLES DRAWN AEROBIC AND ANAEROBIC Blood Culture results may not be optimal due to an inadequate volume of blood received in culture bottles   Culture   Final    NO GROWTH 5 DAYS Performed at [facility], [address] [place], [place] [address]    Report Status 10/04/2019 FINAL  Final  Blood Culture (routine x 2)     Status: None   Collection Time: 09/29/19  8:30 PM   Specimen: BLOOD RIGHT ARM  Result Value Ref Range Status   Specimen Description BLOOD RIGHT ARM  Final   Special Requests   Final    BOTTLES DRAWN AEROBIC AND ANAEROBIC Blood Culture adequate volume   Culture   Final    NO GROWTH 5 DAYS Performed at [facility], [address], [place], [place] [address]    Report Status 10/04/2019 FINAL  Final  Respiratory Panel by RT PCR (Flu A&B, Covid) - Nasopharyngeal Swab     Status: Abnormal   Collection Time: 09/29/19  8:56 PM   Specimen: Nasopharyngeal Swab  Result Value Ref Range Status   SARS Coronavirus 2 by RT PCR POSITIVE (A) NEGATIVE Final    Comment: RESULT CALLED TO, READ BACK BY AND VERIFIED WITH: A. CAIN,RN 2234 09/29/2019 T. TYSOR (NOTE) SARS-CoV-2 target nucleic acids are DETECTED. SARS-CoV-2 RNA is generally detectable in upper respiratory specimens  during the acute phase of infection. Positive results are indicative of the presence of the identified virus, but do not rule out bacterial infection or co-infection with other pathogens not detected by the test. Clinical correlation with patient history and other diagnostic information is necessary to determine patient infection  status. The expected result is Negative. Fact Sheet for Patients:  https://www.moore.com/ Fact Sheet for Healthcare Providers: https://www.young.biz/ This test is not yet approved or cleared by the [place] FDA and  has been authorized for detection  and/or diagnosis of SARS-CoV-2 by FDA under an Emergency Use Authorization (EUA).  This EUA will remain in effect (meaning this test can be used) f or the duration of  the COVID-19 declaration under Section 564(b)(1) of the Act, 21 U.S.C. section 360bbb-3(b)(1), unless the authorization is terminated or revoked sooner.    Influenza A by PCR NEGATIVE NEGATIVE Final   Influenza B by PCR NEGATIVE NEGATIVE Final    Comment: (NOTE) The Xpert Xpress SARS-CoV-2/FLU/RSV assay is intended as an aid in  the diagnosis of influenza from Nasopharyngeal swab specimens and  should not be used as a sole basis for treatment. Nasal washings and  aspirates are unacceptable for Xpert Xpress SARS-CoV-2/FLU/RSV  testing. Fact Sheet for Patients: https://www.moore.com/ Fact Sheet for Healthcare Providers: https://www.young.biz/ This test is not yet approved or cleared by the [place] FDA and  has been authorized for detection and/or diagnosis of SARS-CoV-2 by  FDA under an Emergency Use Authorization (EUA). This EUA will remain  in effect (meaning this test can be used) for the duration of the  Covid-19 declaration under Section 564(b)(1) of the Act, 21  U.S.C. section 360bbb-3(b)(1), unless the authorization is  terminated or revoked. Performed at [facility], [address] [place], [place] [address]   Urine culture     Status: Abnormal   Collection Time: 09/29/19  9:53 PM   Specimen: In/Out Cath Urine  Result Value Ref Range Status   Specimen Description IN/OUT CATH URINE  Final   Special Requests   Final    NONE Performed at [facility], [address], [place], [place] [address]    Culture (A)  Final    100 COLONIES/mL PROTEUS MIRABILIS 10,000 COLONIES/mL RAOULTELLA PLANTICOLA    Report Status 10/04/2019 FINAL  Final   Organism ID, Bacteria PROTEUS MIRABILIS (A)  Final   Organism ID, Bacteria RAOULTELLA PLANTICOLA (A)  Final       Susceptibility   Proteus mirabilis - MIC*    AMPICILLIN <=2 SENSITIVE Sensitive     CEFAZOLIN <=4 SENSITIVE Sensitive     CEFTRIAXONE <=0.25 SENSITIVE Sensitive     CIPROFLOXACIN <=0.25 SENSITIVE Sensitive     GENTAMICIN <=1 SENSITIVE Sensitive     IMIPENEM 1 SENSITIVE Sensitive     NITROFURANTOIN RESISTANT Resistant     TRIMETH/SULFA <=20 SENSITIVE Sensitive     AMPICILLIN/SULBACTAM <=2 SENSITIVE Sensitive     PIP/TAZO <=4 SENSITIVE Sensitive     * 100 COLONIES/mL PROTEUS MIRABILIS   Raoultella planticola - MIC*    AMPICILLIN >=32 RESISTANT Resistant     CEFAZOLIN <=4 SENSITIVE Sensitive     CEFTRIAXONE <=0.25 SENSITIVE Sensitive     CIPROFLOXACIN <=0.25 SENSITIVE Sensitive     GENTAMICIN <=1 SENSITIVE Sensitive     IMIPENEM <=0.25 SENSITIVE Sensitive     NITROFURANTOIN 32 SENSITIVE Sensitive     TRIMETH/SULFA <=20 SENSITIVE Sensitive     AMPICILLIN/SULBACTAM 4 SENSITIVE Sensitive     PIP/TAZO <=4 SENSITIVE Sensitive     * 10,000 COLONIES/mL RAOULTELLA PLANTICOLA  MRSA PCR Screening     Status: Abnormal   Collection Time: 09/30/19  7:27 AM   Specimen: Nasal Mucosa; Nasopharyngeal  Result Value Ref Range Status   MRSA by PCR POSITIVE (A) NEGATIVE Final  Comment:        The GeneXpert MRSA Assay (FDA approved for NASAL specimens only), is one component of a comprehensive MRSA colonization surveillance program. It is not intended to diagnose MRSA infection nor to guide or monitor treatment for MRSA infections. RESULT CALLED TO, READ BACK BY AND VERIFIED WITH: Tenna Child RN 11:50 09/30/19 ([email or web address]) Performed at [facility], [address] [place], [place] [address]      Labs: Basic Metabolic Panel: Recent Labs  Lab 09/29/19 2008 10/01/19 0239 10/02/19 0501 10/03/19 1838  NA 142 144 148* 143  K 3.8 3.2* 3.9 3.3*  CL 102 115* 118* 116*  CO2 24 21* 23 20*  GLUCOSE 171* 99 93 123*  BUN 19 13 17 15   CREATININE 0.83 0.53 0.61 0.59  CALCIUM 9.1 8.0* 8.1* 7.8*     Liver Function Tests: Recent Labs  Lab 09/29/19 2008  AST 50*  ALT 24  ALKPHOS 82  BILITOT 0.7  PROT 6.8  ALBUMIN 2.9*   No results for input(s): LIPASE, AMYLASE in the last 168 hours. No results for input(s): AMMONIA in the last 168 hours. CBC: Recent Labs  Lab 09/29/19 2008 10/01/19 0239 10/02/19 0501 10/04/19 0603  WBC 7.3 5.7 5.9 4.4  NEUTROABS 5.2 3.3 2.4 1.9  HGB 14.4 11.3* 11.6* 11.0*  HCT 43.3 33.1* 35.4* 32.9*  MCV 84.1 82.3 85.7 83.3  PLT 166 164 175 222   Cardiac Enzymes: No results for input(s): CKTOTAL, CKMB, CKMBINDEX, TROPONINI in the last 168 hours. BNP: BNP (last 3 results) Recent Labs    10/04/19 0602  BNP 1,353.8*    ProBNP (last 3 results) No results for input(s): PROBNP in the last 8760 hours.  CBG: No results for input(s): GLUCAP in the last 168 hours.  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  Signed:        Ziyad Dyar, DO [facility]  10/04/2019, 3:02 PM

## 2019-10-04 NOTE — Progress Notes (Signed)
CSW received consult that patient is from [facility] and spouse would like her to return there. CSW left voicemail for facility and will fax over FL2. Waiting to see if hospice is also needed.   Osborne Casco Sagrario Lineberry LCSW [phone]

## 2019-10-04 NOTE — TOC Progression Note (Signed)
Transition of Care [facility]) - Progression Note    Patient Details  Name: Toni Sullivan MRN: [id number] Date of Birth: [birth year]  Transition of [facility]) CM/SW Contact  Nonda Lou, [place] Phone Number: 10/04/2019, 2:23 PM  Clinical Narrative:    CSW contacted [facility] memory care [phone]) and spoke with Tammy. CSW informed memory care that patient is medically cleared to be discharged and the fl2 was sent. CSW asked if patient could return today, Tammy stated a discharge summary is needed. CSW informed her verification of placement is being requested, prior to discharging patient. She stated she first needs to review the discharge summary to provide an answer on whether patient can return.       Expected Discharge Plan and Services           Expected Discharge Date: 10/04/19                                     Social Determinants of Health (SDOH) Interventions    Readmission Risk Interventions No flowsheet data found.

## 2019-10-04 NOTE — Progress Notes (Signed)
CSW sent new fl2 and d/c summary to [facility]. Followed-up, unable to speak with someone.   [employer], [address]

## 2019-10-04 NOTE — Care Management Important Message (Signed)
Important Message  Patient Details  Name: Toni Sullivan MRN: [id number] Date of Birth: [birth year]   Medicare Important Message Given:  Yes - Important Message mailed due to current National Emergency  Verbal consent obtained due to current National Emergency  Relationship to patient: Spouse/Significant Other Contact Name: Breely Panik Call Date: 10/04/19  Time: 1102 Phone: [phone] Outcome: No Answer/Busy Important Message mailed to: Patient address on file    [name] 10/04/2019, 11:02 AM

## 2019-10-04 NOTE — NC FL2 (Addendum)
[place] MEDICAID FL2 LEVEL OF CARE SCREENING TOOL     IDENTIFICATION  Patient Name: Toni Sullivan Birthdate: Apr 13, 1951 Sex: female Admission Date (Current Location): 09/29/2019  [facility] and [place] Number:  Producer, television/film/video and Address:  [facility] [place]. [facility], [address] [place], [place] [address]      Provider Number: [id number]  Attending Physician Name and Address:  Fran Lowes, DO  Relative Name and Phone Number:  Earlene Plater, spouse, [phone]    Current Level of Care: Hospital Recommended Level of Care: Memory Care(SCU) Prior Approval Number:    Date Approved/Denied:   PASRR Number:    Discharge Plan: Other (Comment)(Memory care SCU)    Current Diagnoses: Patient Active Problem List   Diagnosis Date Noted  . COVID-19 virus infection 09/30/2019  . Elevated troponin 09/30/2019  . AF (paroxysmal atrial fibrillation) (HCC) 09/30/2019  . Anxiety 09/30/2019  . Depression 09/30/2019  . Hypotension 09/29/2019  . Sepsis secondary to UTI (HCC) 07/22/2018  . UTI (urinary tract infection) 06/12/2017  . Delirium 06/12/2017  . Aggressive behavior   . Urinary tract infection without hematuria   . Acute metabolic encephalopathy 02/03/2017  . Acute lower UTI 02/03/2017  . 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 History of CVA (cerebrovascular accident) 07/03/2013    Orientation RESPIRATION BLADDER Height & Weight     (Disoriented x4)  Normal Incontinent Weight: 120 lb (54.4 kg) Height:  5\' 4"  (162.6 cm)  BEHAVIORAL SYMPTOMS/MOOD NEUROLOGICAL BOWEL NUTRITION STATUS      Incontinent Diet(pureed foods and thickened liquids)  AMBULATORY STATUS COMMUNICATION OF NEEDS Skin   Extensive Assist Verbally Normal                       Personal Care Assistance Level of Assistance  Bathing, Feeding, Dressing Bathing Assistance: Maximum assistance Feeding assistance: Maximum  assistance Dressing Assistance: Maximum assistance     Functional Limitations Info  Speech     Speech Info: Impaired    SPECIAL CARE FACTORS FREQUENCY                       Contractures Contractures Info: Not present    Additional Factors Info  Code Status, Allergies, Psychotropic, Isolation Precautions Code Status Info: DNR Allergies Info: Penicillins Psychotropic Info: Depakote   Isolation Precautions Info: COVID+     Current Medications (10/04/2019):   Discharge Medications: TAKE these medications   acetaminophen 325 MG tablet Commonly known as: TYLENOL Take 650 mg by mouth every 8 (eight) hours as needed (pain level 1-8 or temp greater than 100 deg F). What changed: Another medication with the same name was removed. Continue taking this medication, and follow the directions you see here.   aspirin EC 81 MG tablet Take 1 tablet (81 mg total) by mouth daily.   divalproex 125 MG capsule Commonly known as: DEPAKOTE SPRINKLE Take 250 mg by mouth 3 (three) times daily.   fluPHENAZine 1 MG tablet Commonly known as: PROLIXIN Take 1 tablet (1 mg total) by mouth at bedtime.   furosemide 20 MG tablet Commonly known as: Lasix Take 1 tablet (20 mg total) by mouth every other day.   guaifenesin 100 MG/5ML syrup Commonly known as: ROBITUSSIN Take 200 mg by mouth every 6 (six) hours as needed for cough.   loperamide 2 MG capsule Commonly known as: IMODIUM Take 2 mg by mouth as needed for diarrhea or loose  stools. Do not exceed 8 doses in 24 hours   LORazepam 0.5 MG tablet Commonly known as: ATIVAN Take 1 tablet (0.5 mg total) by mouth every 8 (eight) hours as needed for anxiety. What changed:   when to take this  reasons to take this  additional instructions   magnesium hydroxide 400 MG/5ML suspension Commonly known as: MILK OF MAGNESIA Take 30 mLs by mouth at bedtime as needed for mild constipation.   Melatonin 3 MG Tabs Take 3 mg by mouth at  bedtime.   Mintox 098-119-14 MG/5ML suspension Generic drug: alum & mag hydroxide-simeth Take 30 mLs by mouth 4 (four) times daily as needed for indigestion or heartburn. Do not exceed 4 doses in 24 hours   mirtazapine 15 MG tablet Commonly known as: REMERON Take 15 mg by mouth at bedtime.   mupirocin ointment 2 % Commonly known as: BACTROBAN Place 1 application into the nose 2 (two) times daily.   Resource ThickenUp Clear Powd Take 125 g by mouth as needed.   rivastigmine 13.3 MG/24HR Commonly known as: Exelon Apply 1 patch (13.3 mg total) topically daily.   sertraline 25 MG tablet Commonly known as: ZOLOFT Take 75 mg by mouth daily.   Triple Antibiotic [id number] Oint Apply 1 application topically as needed (minor skin tears or abrasions).     Relevant Imaging Results:  Relevant Lab Results:   Additional Information [id number];  Benard Halsted, LCSW

## 2019-10-05 MED ORDER — ORAL CARE MOUTH RINSE
15.0000 mL | Freq: Two times a day (BID) | OROMUCOSAL | Status: DC
  Administered 2019-10-04 – 2019-10-07 (×6): 15 mL via OROMUCOSAL

## 2019-10-05 MED ORDER — SODIUM CHLORIDE 0.9 % IV BOLUS
500.0000 mL | Freq: Once | INTRAVENOUS | Status: AC
  Administered 2019-10-05: 500 mL via INTRAVENOUS

## 2019-10-05 NOTE — Progress Notes (Signed)
   10/05/19 0629  MEWS Score  BP (!) 82/56  Pulse Rate 73  ECG Heart Rate 73  Resp (!) 23  SpO2 94 %  O2 Device Room Air  MEWS Score  MEWS Temp 0  MEWS Systolic 1  MEWS Pulse 0  MEWS RR 1  MEWS LOC 0  MEWS Score 2  MEWS Score Color Yellow  MEWS Assessment  Is this an acute change? No  Provider Notification  Provider Name/Title Bodenheimer, NP  Date Provider Notified 10/05/19  Time Provider Notified [phone]  Notification Type Page  Notification Reason Other (Comment) (Hypotension. Resp maintaining 20-30.Temp 99.1 Ax.)  Response See new orders  Date of Provider Response 10/05/19  Time of Provider Response [phone]

## 2019-10-05 NOTE — Progress Notes (Signed)
   10/05/19 0011  MEWS Score  BP (!) 89/69  Pulse Rate 75  ECG Heart Rate 75  Resp (!) 23  SpO2 95 %  O2 Device Room Air  MEWS Score  MEWS Temp 0  MEWS Systolic 1  MEWS Pulse 0  MEWS RR 1  MEWS LOC 0  MEWS Score 2  MEWS Score Color Yellow  MEWS Assessment  Is this an acute change? No  Provider Notification  Provider Name/Title Bodenheimer, NP  Date Provider Notified 10/05/19  Time Provider Notified [phone]  Notification Type Page  Notification Reason Other (Comment) (Hypotension.Pt resting in bed,easy to wake,no distress noted)  Response No new orders   Edema noted on all four extremities. Will continue to monitor.

## 2019-10-05 NOTE — TOC Progression Note (Addendum)
Transition of Care [facility]) - Progression Note    Patient Details  Name: Toni Sullivan MRN: [id number] Date of Birth: August 20, 1951  [facility] of [facility] CM/SW Contact  Levada Schilling Phone Number: 10/05/2019, 9:52 AM  Clinical Narrative:    CSW spoke with Dorathy Daft, nurse receptionist. Dorathy Daft stated "pt will not be able to come to [facility] on the weekend due to not having staff on weekends to accept pt". CSW left a voice message for Lesli Albee at DSS to inform her of the pt not being able to discharge and return to [facility] on Saturday and will have to return on Monday.TOC team will continue to follow for disposition planning.        Expected Discharge Plan and Services           Expected Discharge Date: 10/04/19                                     Social Determinants of Health (SDOH) Interventions    Readmission Risk Interventions No flowsheet data found.

## 2019-10-05 NOTE — Progress Notes (Signed)
PROGRESS NOTE  Toni Sullivan BZJ:[id number] DOB: 1950/12/28 DOA: 09/29/2019 PCP: Jinny Sanders, MD  Brief History   Toni Sullivan is a 69 y.o. female with medical history significant Alzheimer's dementia, CVA, depression/anxiety who presented from nursing home for concerns of  cyanosis and responsiveness only to noxious stimuli. Found to have SBP of 76. Fever of 103.  Patient is unable to provide history.  Per husband she has incoherent speech at baseline and is bedbound/wheelchair bound.  He is unable to provide any more history regarding her symptoms since he has not seen her at the nursing facility for some time.   On arrival to ED, She was febrile up to 100.3 and hypotensive with systolic down to 01B. This improved to about systolic of 510 after 2.58N of IV fluid. Lactate was 2.9, Anion gap of 16, UA positive for UTI. She tested positive for COVID19. Troponin was elevated and continued to increase. This was discussed with cardiology who felt that it was due to demand ischemia in the setting of acute illness. They feel that the patient is not a good candidate for invasive procedures and recommend an echocardiogram if her troponins continue to increase. They increased to 1800 on 10/01/2019. Echo has been performed, and report is pending.   She was started on broad spectrum antibiotics with IV cefepime, vancomycin and flagyl prior to COVID results returning positive.  She has been admitted to a telemetry bed. She also has been found to have a UTI for which she is receiving ceftriaxone. She has shaking chills. Flagyl, Cefepime, and vancomycin have been discontinued.   There was a rapid response called on the evening of 10/01/2019 due to hypotension. She was given a fluid bolus. This is much improved following another bolus.  Report for echocardiogram performed on 10/02/2019 is available and reveals EF of 35-40% with likely takasubo's as the cause. She also has wall motion abnormalities and an  outflow tract obstruction. Cardiology was consulted. I have discussed the patient with Dr. Acie Fredrickson who states that due to her hypotension she is not a candidate for ACE I or beta blocker. Due to her advanced dementia and poor functional status she is not a candidate for intervention, ie LHC. I appreciate his assistance.  Consultants  . None  Procedures  . None  Antibiotics   Anti-infectives (From admission, onward)   Start     Dose/Rate Route Frequency Ordered Stop   09/30/19 2100  vancomycin (VANCOCIN) IVPB 1000 mg/200 mL premix  Status:  Discontinued     1,000 mg 200 mL/hr over 60 Minutes Intravenous Every 24 hours 09/29/19 2104 09/30/19 0013   09/30/19 1000  cefTRIAXone (ROCEPHIN) 1 g in sodium chloride 0.9 % 100 mL IVPB     1 g 200 mL/hr over 30 Minutes Intravenous Every 24 hours 09/30/19 0013     09/30/19 0830  ceFEPIme (MAXIPIME) 2 g in sodium chloride 0.9 % 100 mL IVPB  Status:  Discontinued     2 g 200 mL/hr over 30 Minutes Intravenous Every 12 hours 09/29/19 2104 09/30/19 0013   09/29/19 2015  ceFEPIme (MAXIPIME) 2 g in sodium chloride 0.9 % 100 mL IVPB     2 g 200 mL/hr over 30 Minutes Intravenous  Once 09/29/19 2009 09/29/19 2054   09/29/19 2015  metroNIDAZOLE (FLAGYL) IVPB 500 mg     500 mg 100 mL/hr over 60 Minutes Intravenous  Once 09/29/19 2009 09/29/19 2118   09/29/19 2015  vancomycin (VANCOCIN) IVPB 1000 mg/200  mL premix  Status:  Discontinued     1,000 mg 200 mL/hr over 60 Minutes Intravenous  Once 09/29/19 2009 09/29/19 2011   09/29/19 2015  vancomycin (VANCOREADY) IVPB 1250 mg/250 mL     1,250 mg 166.7 mL/hr over 90 Minutes Intravenous  Once 09/29/19 2011 09/29/19 2221      Subjective  The patient is lying in bed with her eyes open quietly.   Objective   Vitals:  Vitals:   10/05/19 0955 10/05/19 1000  BP: 101/71 96/66  Pulse: 61 66  Resp: 17 18  Temp:    SpO2: 94% 95%   Exam:  Constitutional:  The patient is lying in bed with her eyes open  quietly. She is in no acute distress.   Respiratory:  . No increased work of breathing. . No wheezes, rales, or rhonchi . No tactile fremitus Cardiovascular:  . Regular rate and rhythm . No murmurs, ectopy, or gallups. . No lateral PMI. No thrills. Abdomen:  . Abdomen is soft, non-tender, non-distended . No hernias, masses, or organomegaly . Normoactive bowel sounds.  Musculoskeletal:  . No cyanosis, clubbing, or edema Skin:  . No rashes, lesions, ulcers . palpation of skin: no induration or nodules Neurologic:  . CN 2-12 intact . Sensation all 4 extremities intact Psychiatric:  . Mental status o Mood, affect appropriate o Orientation to person, place, time  . judgment and insight appear intact  I have personally reviewed the following:   Today's Data  . Vitals, CMP, CBC, troponin . Echocardiagram  Scheduled Meds: . aspirin  325 mg Oral Daily  . divalproex  250 mg Oral TID  . enoxaparin (LOVENOX) injection  40 mg Subcutaneous Q24H  . fluPHENAZine  1 mg Oral QHS  . mouth rinse  15 mL Mouth Rinse BID  . Melatonin  3 mg Oral QHS  . mirtazapine  15 mg Oral QHS  . rivastigmine  13.3 mg Transdermal Q breakfast  . sertraline  75 mg Oral Daily   Continuous Infusions: . sodium chloride 75 mL/hr at 10/05/19 1000  . 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 Received 1.75 L in ED with improvement of BP. Blood pressures are improved today after starting a 1L bolus of NS. Mental status is also improved.  Monitor.  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 Pt is not a candidate for any invasive procedure or even use of beta blocker or ACE to treat cardiomyopathy as per Dr. Elease Hashimoto. I appreciate his help.   COVID viral infection: Not hypoxic on admit or today. LDH elevated at 271, Ferritin WNL at 199, CRP elevated at 4.7. D Dimer at 2.2. Monitor inflammatory markers and respiratory status.   Hypokalemia: Resolved. Monitor.  UTI vs asymptomatic bacteriuria: Pt unable to give symptoms due to dementia. Continue IV Rocephine. Urine culture has had no growth. Monitor.  Elevated anion gap: Resolved.  Elevated troponin:  Elevated from 714 to >1400 >1800. Down to 964 yesterday afternoon. This was discussed with cardiology fellow Dr. Tressie Ellis by the  admitting physician. It was felt most likely to be due to demand ischemic from COVID infection and hypotension. They were elevated again at 1800 on 09/30/2019, but are down to 964 last night.Echocardiogram has been performed, but report is pending. Pt is not a good candidate for invasive intervention given severe dementia, non functional at baseline.   Dementia with mood disorder: Per her husband at baseline she is not conversational and wheelchair bound. Appears to be at her baseline on admit. Continue rivastigmine patch. She is more alert today, but without intelligible speech. Per her husband, this is her baseline.  Dysphagia: Chronic. SLP has placed the patient on a DYS 1 diet with pureed foods and thickened liquids. However she has revisited the patient today. She feels that the patient's dysphagia is a poor prognosis. Her husband states that this is what she eats at the care center where she resides.   Paroxysmal A. Fib: Not on anticoagulation due to hx of falls  Chronic depression/anxiety: Resume outpatient medicationswhen able to swallow safely.  Prior history of CVA with resultant dysphagia as stated above. Per husband patient is  mostly bedbound/wheelchair .  I have seen and examined this patient myself.  I have spent 32 minutes in her evaluation and care.   DVT prophylaxis:.Lovenox Code Status: DNR- presented with [facility] and confirmed with husband Family Communication: I have discussed with the patient over the phone. Disposition Plan: To return to prior facility with outpatient palliative care. It seems that transfer back to this facility is unlikely to occur until Monday.  Olga Seyler, DO [facility] Direct contact: see [email or web address]  7PM-7AM contact night coverage as above 10/05/2019, 3:48 PM  LOS: 1 day

## 2019-10-05 NOTE — Progress Notes (Signed)
PROGRESS NOTE  Toni Sullivan BZJ:[id number] DOB: 1950/12/28 DOA: 09/29/2019 PCP: Jinny Sanders, MD  Brief History   Toni Sullivan is a 69 y.o. female with medical history significant Alzheimer's dementia, CVA, depression/anxiety who presented from nursing home for concerns of  cyanosis and responsiveness only to noxious stimuli. Found to have SBP of 76. Fever of 103.  Patient is unable to provide history.  Per husband she has incoherent speech at baseline and is bedbound/wheelchair bound.  He is unable to provide any more history regarding her symptoms since he has not seen her at the nursing facility for some time.   On arrival to ED, She was febrile up to 100.3 and hypotensive with systolic down to 01B. This improved to about systolic of 510 after 2.58N of IV fluid. Lactate was 2.9, Anion gap of 16, UA positive for UTI. She tested positive for COVID19. Troponin was elevated and continued to increase. This was discussed with cardiology who felt that it was due to demand ischemia in the setting of acute illness. They feel that the patient is not a good candidate for invasive procedures and recommend an echocardiogram if her troponins continue to increase. They increased to 1800 on 10/01/2019. Echo has been performed, and report is pending.   She was started on broad spectrum antibiotics with IV cefepime, vancomycin and flagyl prior to COVID results returning positive.  She has been admitted to a telemetry bed. She also has been found to have a UTI for which she is receiving ceftriaxone. She has shaking chills. Flagyl, Cefepime, and vancomycin have been discontinued.   There was a rapid response called on the evening of 10/01/2019 due to hypotension. She was given a fluid bolus. This is much improved following another bolus.  Report for echocardiogram performed on 10/02/2019 is available and reveals EF of 35-40% with likely takasubo's as the cause. She also has wall motion abnormalities and an  outflow tract obstruction. Cardiology was consulted. I have discussed the patient with Dr. Acie Fredrickson who states that due to her hypotension she is not a candidate for ACE I or beta blocker. Due to her advanced dementia and poor functional status she is not a candidate for intervention, ie LHC. I appreciate his assistance.  Consultants  . None  Procedures  . None  Antibiotics   Anti-infectives (From admission, onward)   Start     Dose/Rate Route Frequency Ordered Stop   09/30/19 2100  vancomycin (VANCOCIN) IVPB 1000 mg/200 mL premix  Status:  Discontinued     1,000 mg 200 mL/hr over 60 Minutes Intravenous Every 24 hours 09/29/19 2104 09/30/19 0013   09/30/19 1000  cefTRIAXone (ROCEPHIN) 1 g in sodium chloride 0.9 % 100 mL IVPB     1 g 200 mL/hr over 30 Minutes Intravenous Every 24 hours 09/30/19 0013     09/30/19 0830  ceFEPIme (MAXIPIME) 2 g in sodium chloride 0.9 % 100 mL IVPB  Status:  Discontinued     2 g 200 mL/hr over 30 Minutes Intravenous Every 12 hours 09/29/19 2104 09/30/19 0013   09/29/19 2015  ceFEPIme (MAXIPIME) 2 g in sodium chloride 0.9 % 100 mL IVPB     2 g 200 mL/hr over 30 Minutes Intravenous  Once 09/29/19 2009 09/29/19 2054   09/29/19 2015  metroNIDAZOLE (FLAGYL) IVPB 500 mg     500 mg 100 mL/hr over 60 Minutes Intravenous  Once 09/29/19 2009 09/29/19 2118   09/29/19 2015  vancomycin (VANCOCIN) IVPB 1000 mg/200  mL premix  Status:  Discontinued     1,000 mg 200 mL/hr over 60 Minutes Intravenous  Once 09/29/19 2009 09/29/19 2011   09/29/19 2015  vancomycin (VANCOREADY) IVPB 1250 mg/250 mL     1,250 mg 166.7 mL/hr over 90 Minutes Intravenous  Once 09/29/19 2011 09/29/19 2221      Subjective  The patient is lying in bed with her eyes open and phonating. She is not interactive with me.   Objective   Vitals:  Vitals:   10/05/19 0955 10/05/19 1000  BP: 101/71 96/66  Pulse: 61 66  Resp: 17 18  Temp:    SpO2: 94% 95%   Exam:  Constitutional:  The patient is  lying in bed with her eyes and phonating.  .  No acute distress. Respiratory:  . No increased work of breathing. . No wheezes, rales, or rhonchi . No tactile fremitus Cardiovascular:  . Regular rate and rhythm . No murmurs, ectopy, or gallups. . No lateral PMI. No thrills. Abdomen:  . Abdomen is soft, non-tender, non-distended . No hernias, masses, or organomegaly . Normoactive bowel sounds.  Musculoskeletal:  . No cyanosis, clubbing, or edema Skin:  . No rashes, lesions, ulcers . palpation of skin: no induration or nodules Neurologic:  . CN 2-12 intact . Sensation all 4 extremities intact Psychiatric:  . Mental status o Mood, affect appropriate o Orientation to person, place, time  . judgment and insight appear intact  I have personally reviewed the following:   Today's Data  . Vitals, CMP, CBC, troponin . Echocardiagram  Scheduled Meds: . aspirin  325 mg Oral Daily  . divalproex  250 mg Oral TID  . enoxaparin (LOVENOX) injection  40 mg Subcutaneous Q24H  . fluPHENAZine  1 mg Oral QHS  . mouth rinse  15 mL Mouth Rinse BID  . Melatonin  3 mg Oral QHS  . mirtazapine  15 mg Oral QHS  . rivastigmine  13.3 mg Transdermal Q breakfast  . sertraline  75 mg Oral Daily   Continuous Infusions: . sodium chloride 75 mL/hr at 10/05/19 1000  . 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 Received 1.75 L in ED with improvement of BP. Blood pressures are improved today after starting a 1L bolus of NS. Mental status  is also improved. Monitor.  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 Pt is not a candidate for any invasive procedure or even use of beta blocker or ACE to treat cardiomyopathy as per Dr. Acie Fredrickson. I appreciate his help.   COVID viral infection: Not hypoxic on admit or today. LDH elevated at 271, Ferritin WNL at 199, CRP elevated at 4.7. D Dimer at 2.2. Monitor inflammatory markers and respiratory status.   Hypokalemia: Resolved. Monitor.  UTI vs asymptomatic bacteriuria: Pt unable to give symptoms due to dementia. Continue IV Rocephine. Urine culture has had no growth. Monitor.  Elevated anion gap: Resolved.  Elevated troponin:  Elevated from 714 to >1400 >1800. Down to 964 yesterday afternoon. This was discussed with cardiology  fellow Dr. Tressie Ellis by the admitting physician. It was felt most likely to be due to demand ischemic from COVID infection and hypotension. They were elevated again at 1800 on 09/30/2019, but are down to 964 last night.Echocardiogram has been performed, but report is pending. Pt is not a good candidate for invasive intervention given severe dementia, non functional at baseline.   Dementia with mood disorder: Per her husband at baseline she is not conversational and wheelchair bound. Appears to be at her baseline on admit. Continue rivastigmine patch. She is more alert today, but without intelligible speech. Per her husband, this is her baseline.  Dysphagia: Chronic. SLP has placed the patient on a DYS 1 diet with pureed foods and thickened liquids. However she has revisited the patient today. She feels that the patient's dysphagia is a poor prognosis. Her husband states that this is what she eats at the care center where she resides.   Paroxysmal A. Fib: Not on anticoagulation due to hx of falls  Chronic depression/anxiety: Resume outpatient medicationswhen able to swallow safely.  Prior history of CVA with resultant dysphagia as stated above. Per  husband patient is mostly bedbound/wheelchair .  I have seen and examined this patient myself.  I have spent 32 minutes in her evaluation and care.   DVT prophylaxis:.Lovenox Code Status: DNR- presented with [facility] and confirmed with husband Family Communication: I have discussed with the patient over the phone. Disposition Plan: To return to prior facility with outpatient palliative care.  Toni Mcgrory, DO [facility] Direct contact: see [email or web address]  7PM-7AM contact night coverage as above 10/04/2019, 6:42 PM  LOS: 1 day

## 2019-10-06 MED ORDER — BISACODYL 10 MG RE SUPP
10.0000 mg | Freq: Once | RECTAL | Status: AC
  Administered 2019-10-06: 10 mg via RECTAL
  Filled 2019-10-06: qty 1

## 2019-10-06 MED ORDER — DOCUSATE SODIUM 100 MG PO CAPS
200.0000 mg | ORAL_CAPSULE | Freq: Once | ORAL | Status: AC
  Administered 2019-10-06: 200 mg via ORAL
  Filled 2019-10-06: qty 2

## 2019-10-06 MED ORDER — WHITE PETROLATUM EX OINT
TOPICAL_OINTMENT | CUTANEOUS | Status: AC
  Filled 2019-10-06: qty 28.35

## 2019-10-06 NOTE — Progress Notes (Signed)
PROGRESS NOTE  Toni Sullivan WER:[id number] DOB: Sep 28, 1950 DOA: 09/29/2019 PCP: Excell Seltzer, MD  Brief History   Toni Sullivan is a 69 y.o. female with medical history significant Alzheimer's dementia, CVA, depression/anxiety who presented from nursing home for concerns of  cyanosis and responsiveness only to noxious stimuli. Found to have SBP of 76. Fever of 103.  Patient is unable to provide history.  Per husband she has incoherent speech at baseline and is bedbound/wheelchair bound.  He is unable to provide any more history regarding her symptoms since he has not seen her at the nursing facility for some time.   On arrival to ED, She was febrile up to 100.3 and hypotensive with systolic down to 19J. This improved to about systolic of 102 after 1.75L of IV fluid. Lactate was 2.9, Anion gap of 16, UA positive for UTI. She tested positive for COVID19. Troponin was elevated and continued to increase. This was discussed with cardiology who felt that it was due to demand ischemia in the setting of acute illness. They feel that the patient is not a good candidate for invasive procedures and recommend an echocardiogram if her troponins continue to increase. They increased to 1800 on 10/01/2019. Echo has been performed, and report is pending.   She was started on broad spectrum antibiotics with IV cefepime, vancomycin and flagyl prior to COVID results returning positive.  She has been admitted to a telemetry bed. She also has been found to have a UTI for which she is receiving ceftriaxone. She has shaking chills. Flagyl, Cefepime, and vancomycin have been discontinued.   There was a rapid response called on the evening of 10/01/2019 due to hypotension. She was given a fluid bolus. This is much improved following another bolus.  Report for echocardiogram performed on 10/02/2019 is available and reveals EF of 35-40% with likely takasubo's as the cause. She also has wall motion abnormalities and an  outflow tract obstruction. Cardiology was consulted. I have discussed the patient with Dr. Elease Hashimoto who states that due to her hypotension she is not a candidate for ACE I or beta blocker. Due to her advanced dementia and poor functional status she is not a candidate for intervention, ie LHC. I appreciate his assistance.  The patient is awaiting discharge to the facility in which she resided prior to admission.  Consultants  . None  Procedures  . None  Antibiotics   Anti-infectives (From admission, onward)   Start     Dose/Rate Route Frequency Ordered Stop   09/30/19 2100  vancomycin (VANCOCIN) IVPB 1000 mg/200 mL premix  Status:  Discontinued     1,000 mg 200 mL/hr over 60 Minutes Intravenous Every 24 hours 09/29/19 2104 09/30/19 0013   09/30/19 1000  cefTRIAXone (ROCEPHIN) 1 g in sodium chloride 0.9 % 100 mL IVPB     1 g 200 mL/hr over 30 Minutes Intravenous Every 24 hours 09/30/19 0013     09/30/19 0830  ceFEPIme (MAXIPIME) 2 g in sodium chloride 0.9 % 100 mL IVPB  Status:  Discontinued     2 g 200 mL/hr over 30 Minutes Intravenous Every 12 hours 09/29/19 2104 09/30/19 0013   09/29/19 2015  ceFEPIme (MAXIPIME) 2 g in sodium chloride 0.9 % 100 mL IVPB     2 g 200 mL/hr over 30 Minutes Intravenous  Once 09/29/19 2009 09/29/19 2054   09/29/19 2015  metroNIDAZOLE (FLAGYL) IVPB 500 mg     500 mg 100 mL/hr over 60 Minutes Intravenous  Once 09/29/19 2009 09/29/19 2118   09/29/19 2015  vancomycin (VANCOCIN) IVPB 1000 mg/200 mL premix  Status:  Discontinued     1,000 mg 200 mL/hr over 60 Minutes Intravenous  Once 09/29/19 2009 09/29/19 2011   09/29/19 2015  vancomycin (VANCOREADY) IVPB 1250 mg/250 mL     1,250 mg 166.7 mL/hr over 90 Minutes Intravenous  Once 09/29/19 2011 09/29/19 2221      Subjective  The patient is lying in bed with her eyes open quietly. She is badly constipated per nursing.  Objective   Vitals:  Vitals:   10/06/19 0730 10/06/19 1221  BP: (!) 91/59 99/72   Pulse: 68 75  Resp: 16 14  Temp: 99 F (37.2 C) 98.8 F (37.1 C)  SpO2: 95% 95%   Exam:  Constitutional:  The patient is lying in bed with her eyes open quietly. She is in no acute distress.   Respiratory:  . No increased work of breathing. . No wheezes, rales, or rhonchi . No tactile fremitus Cardiovascular:  . Regular rate and rhythm . No murmurs, ectopy, or gallups. . No lateral PMI. No thrills. Abdomen:  . Abdomen is soft, non-tender, non-distended . No hernias, masses, or organomegaly . Normoactive bowel sounds.  Musculoskeletal:  . No cyanosis, clubbing, or edema Skin:  . No rashes, lesions, ulcers . palpation of skin: no induration or nodules Neurologic:  . CN 2-12 intact . Sensation all 4 extremities intact Psychiatric:  . Mental status o Mood, affect appropriate o Orientation to person, place, time  . judgment and insight appear intact  I have personally reviewed the following:   Today's Data  . Vitals, CMP, CBC, troponin . Echocardiagram  Scheduled Meds: . aspirin  325 mg Oral Daily  . bisacodyl  10 mg Rectal Once  . divalproex  250 mg Oral TID  . docusate sodium  200 mg Oral Once  . enoxaparin (LOVENOX) injection  40 mg Subcutaneous Q24H  . fluPHENAZine  1 mg Oral QHS  . mouth rinse  15 mL Mouth Rinse BID  . Melatonin  3 mg Oral QHS  . mirtazapine  15 mg Oral QHS  . rivastigmine  13.3 mg Transdermal Q breakfast  . sertraline  75 mg Oral Daily  . white petrolatum       Continuous Infusions: . sodium chloride 75 mL/hr at 10/05/19 1800  . 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 Received 1.75 L in ED with improvement of BP. Blood pressures are improved today after starting a 1L bolus of NS. Mental status is also improved. Monitor.  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 Pt is not a candidate for any invasive procedure or even use of beta blocker or ACE to treat cardiomyopathy as per Dr. Elease Hashimoto. I appreciate his help.   COVID viral infection: Not hypoxic on admit or today. LDH elevated at 271, Ferritin WNL at 199, CRP elevated at 4.7. D Dimer at 2.2. Monitor inflammatory markers and respiratory status.   Hypokalemia: Resolved. Monitor.  Constipation: The patient will receive docusate sodium and a bisacodyl suppository. If this does not work we will try lactulose. Monitor for results.  UTI vs asymptomatic bacteriuria: Pt unable to give symptoms due to dementia. Continue IV Rocephine. Urine culture has had no growth. Monitor.  Elevated anion gap: Resolved.  Elevated troponin:  Elevated from 714 to >1400 >1800. Down to 964 yesterday afternoon. This was discussed with cardiology fellow Dr. Larence Penning by the admitting physician. It was felt most likely to be due to demand ischemic from COVID infection and hypotension. They were elevated again at 1800 on 09/30/2019, but are down to 964 last night.Echocardiogram has been performed, but report is pending. Pt is not a good candidate for invasive intervention given severe dementia, non functional at baseline.   Dementia with mood disorder: Per her husband at baseline she is not conversational and wheelchair bound. Appears to be at her baseline on admit. Continue rivastigmine patch. She is more alert today, but without intelligible speech. Per her husband, this is her baseline.  Dysphagia: Chronic. SLP has placed the patient on a DYS 1 diet with pureed foods and thickened liquids. However she has revisited the  patient today. She feels that the patient's dysphagia is a poor prognosis. Her husband states that this is what she eats at the care center where she resides.   Paroxysmal A. Fib: Not on anticoagulation due to hx of falls  Chronic depression/anxiety: Resume outpatient medicationswhen able to swallow safely.  Prior history of CVA with resultant dysphagia as stated above. Per husband patient is mostly bedbound/wheelchair .  I have seen and examined this patient myself.  I have spent 30 minutes in her evaluation and care.   DVT prophylaxis:.Lovenox Code Status: DNR- presented with [facility] and confirmed with husband Family Communication: I have discussed with the patient's husband over the phone. All questions answered. Disposition Plan: To return to prior facility with outpatient palliative care. It seems that transfer back to this facility is unlikely to occur until Monday.  Noralee Dutko, DO [facility] Direct contact: see [email or web address]  7PM-7AM contact night coverage as above 10/06/2019, 2:49 PM  LOS: 1 day

## 2019-10-06 NOTE — Progress Notes (Signed)
Updated pt husband

## 2019-10-07 NOTE — Progress Notes (Signed)
Called [facility] at [phone] to give report 415pm and 432 PM. Spoke with DEDE aand gave report at 436

## 2019-10-07 NOTE — TOC Transition Note (Signed)
Transition of Care [facility]) - CM/SW Discharge Note   Patient Details  Name: Toni Sullivan MRN: [id number] Date of Birth: [birth year]  [facility] CM/SW Contact:  Mearl Latin, LCSW Phone Number: 10/07/2019, 4:11 PM   Clinical Narrative:    Patient will DC to: [facility]  Anticipated DC date: 10/07/19 Family notified: Spouse, Education officer, museum by: Sharin Mons   Per MD patient ready for DC to [facility]. RN, patient, patient's family, and facility notified of DC. Discharge Summary and FL2 sent to facility. RN to call report prior to discharge [phone] ask for RN). DC packet on chart. Ambulance transport requested for patient.   CSW will sign off for now as social work intervention is no longer needed. Please consult [place] again if new needs arise.  Cristobal Goldmann, LCSW Clinical Social Worker [phone]    Final next level of care: Memory Care Barriers to Discharge: No Barriers Identified   Patient Goals and CMS Choice Patient states their goals for this hospitalization and ongoing recovery are:: Return to memory care CMS Medicare.gov Compare Post Acute Care list provided to:: Patient Represenative (must comment)(Spouse) Choice offered to / list presented to : Spouse  Discharge Placement              Patient chooses bed at: [facility] Patient to be transferred to facility by: [facility] Name of family member notified: Spouse, Earlene Plater Patient and family notified of of transfer: 10/07/19  Discharge Plan and Services In-house Referral: Clinical Social Work   Post Acute Care Choice: Nursing Home                               Social Determinants of Health (SDOH) Interventions     Readmission Risk Interventions No flowsheet data found.

## 2019-10-07 NOTE — Plan of Care (Signed)
Pt alert and oriented to self. Fatigued. Unable to express needs CPOT used to score pain of zero. Remains on Room air. No acute distress noted. Total assist for all needs including feeding. Appetite fair. Incontinent of bowel an bladder purwick in place with amber urine in container. Pt is bed bound with q 2 turns. No skin issues. Will continue to monitor

## 2019-10-07 NOTE — Discharge Summary (Addendum)
Physician Discharge Summary  Toni Sullivan OIZ:[id number] DOB: June 28, 1951 DOA: 09/29/2019  PCP: Excell Seltzer, MD  Admit date: 09/29/2019 Discharge date: 10/07/2019  Recommendations for Outpatient Follow-up:  1. DC back to facility of origin with outpatient palliative care consult. 2. Dys 1 pureed diet with honey thick liquids.  Discharge Diagnoses: Principal diagnosis is #1 1. Sepsis 2. Takotsubo's Cardiomyopathy 3. COVID-19 infection 4. Hypokalemia 5. UTI 6. Elevated anion gap 7. Elevated troponin 8. Dementia 9. Dysphagia 10. Paroxysmal atrial fibrillation 11. Chronic depression/anxiety 12. History of CVA  Discharge Condition: Fair  Disposition: Pt to return to long term care facility of origin.  Diet recommendation: Heart healthy - Dys 1 pureed diet with honey thick liquids.  Filed Weights   09/29/19 1947  Weight: 54.4 kg    History of present illness:  Toni Sullivan is a 69 y.o. female with medical history significant Alzheimer's dementia, CVA, depression/anxiety who presented from nursing home for concerns of  cyanosis and responsiveness only to noxious stimuli. Found to have SBP of 76. Fever of 103.  Patient is unable to provide history.  Per husband she has incoherent speech at baseline and is bedbound/wheelchair bound.  He is unable to provide any more history regarding her symptoms since he has not seen her at the nursing facility for some time.   On arrival to ED, She was febrile up to 100.3 and hypotensive with systolic down to 33A. This improved to about systolic of 102 after 1.75L of IV fluid.   WBC of 7.3. Lactate of 2.9. Glucose of 171, creatinine normal at 0.83, anion gap of 16 UA had positive leukocytes and nitrates and few bacteria with >50 WBC. Positive POC COVID test. CXR negative.  Troponin of 714.  She was started on broad spectrum antibiotics with IV cefepime, vancomycin and flagyl prior to COVID results returning positive.  Hospital  Course:  She has been admitted to a telemetry bed. She also has been found to have a UTI for which she is receiving ceftriaxone. She has shaking chills. Flagyl, Cefepime, and vancomycin have been discontinued.   There was a rapid response called on the evening of 10/01/2019 due to hypotension. She was given a fluid bolus. This is much improved following another bolus.  Report for echocardiogram performed on 10/02/2019 is available and reveals EF of 35-40% with likely takasubo's as the cause. She also has wall motion abnormalities and an outflow tract obstruction. Cardiology was consulted. I have discussed the patient with Dr. Elease Hashimoto who states that due to her hypotension she is not a candidate for ACE I or beta blocker. Due to her advanced dementia and poor functional status she is not a candidate for intervention, ie LHC. I appreciate his assistance.  Today the patient is stable in terms of blood pressures and oxygen saturation. She is appropriate to return to the facility in which she resided prior to presentation. I have asked that she follow up with palliative care as outpatient.  Today's assessment: S: The patient is lying quietly. No new complaints. O: Vitals:  Vitals:   10/06/19 2020 10/07/19 0430  BP: 110/76 92/69  Pulse: 80 66  Resp:  16  Temp: 98.3 F (36.8 C) 98.7 F (37.1 C)  SpO2: 95% 96%   Exam:  Constitutional:  The patient is lying in bed with her eyes open. She is not phonating today. She is not interactive with me.   No acute distress. Respiratory:   No increased work of breathing.  No wheezes, rales, or rhonchi  No tactile fremitus Cardiovascular:   Regular rate and rhythm  No murmurs, ectopy, or gallups.  No lateral PMI. No thrills. Abdomen:   Abdomen is soft, non-tender, non-distended  No hernias, masses, or organomegaly  Normoactive bowel sounds.  Musculoskeletal:   No cyanosis, clubbing, or edema Skin:   No rashes, lesions,  ulcers  palpation of skin: no induration or nodules Neurologic:   CN 2-12 intact  Sensation all 4 extremities intact Psychiatric:   Mental status ? Mood, affect appropriate ? Orientation to person, place, time   judgment and insight appear intact Discharge Instructions  Discharge Instructions    MyChart COVID-19 home monitoring program   Complete by: Oct 04, 2019    Is the patient willing to use the MyChart Mobile App for home monitoring?: No   Activity as tolerated - No restrictions   Complete by: As directed    Amb Referral to Palliative Care   Complete by: As directed    Call MD for:  difficulty breathing, headache or visual disturbances   Complete by: As directed    Call MD for:  severe uncontrolled pain   Complete by: As directed    Call MD for:  temperature >100.4   Complete by: As directed    Diet - low sodium heart healthy   Complete by: As directed    Discharge instructions   Complete by: As directed    DC back to facility of origin with outpatient palliative care consult.   Increase activity slowly   Complete by: As directed      Allergies as of 10/07/2019      Reactions   Penicillins Other (See Comments)   Has patient had a PCN reaction causing immediate rash, facial/tongue/throat swelling, SOB or lightheadedness with hypotension: Unkown PCN reaction causing severe rash involving mucus membranes or skin necrosis: Unknown PCN reaction that required hospitalization: Unknown PCN reaction occurring within the last 10 years: No If all of the above answers are "NO", then may proceed with Cephalosporin use. Tolerated Cefepime, Ceftriaxone (06/2018)      Medication List    TAKE these medications   acetaminophen 325 MG tablet Commonly known as: TYLENOL Take 650 mg by mouth every 8 (eight) hours as needed (pain level 1-8 or temp greater than 100 deg F). What changed: Another medication with the same name was removed. Continue taking this medication, and follow  the directions you see here.   aspirin EC 81 MG tablet Take 1 tablet (81 mg total) by mouth daily.   divalproex 125 MG capsule Commonly known as: DEPAKOTE SPRINKLE Take 250 mg by mouth 3 (three) times daily.   fluPHENAZine 1 MG tablet Commonly known as: PROLIXIN Take 1 tablet (1 mg total) by mouth at bedtime.   furosemide 20 MG tablet Commonly known as: Lasix Take 1 tablet (20 mg total) by mouth every other day.   guaifenesin 100 MG/5ML syrup Commonly known as: ROBITUSSIN Take 200 mg by mouth every 6 (six) hours as needed for cough.   loperamide 2 MG capsule Commonly known as: IMODIUM Take 2 mg by mouth as needed for diarrhea or loose stools. Do not exceed 8 doses in 24 hours   LORazepam 0.5 MG tablet Commonly known as: ATIVAN Take 1 tablet (0.5 mg total) by mouth every 8 (eight) hours as needed for anxiety. What changed:   when to take this  reasons to take this  additional instructions   magnesium hydroxide 400 MG/5ML  suspension Commonly known as: MILK OF MAGNESIA Take 30 mLs by mouth at bedtime as needed for mild constipation.   Melatonin 3 MG Tabs Take 3 mg by mouth at bedtime.   Mintox 209-470-96 MG/5ML suspension Generic drug: alum & mag hydroxide-simeth Take 30 mLs by mouth 4 (four) times daily as needed for indigestion or heartburn. Do not exceed 4 doses in 24 hours   mirtazapine 15 MG tablet Commonly known as: REMERON Take 15 mg by mouth at bedtime.   mupirocin ointment 2 % Commonly known as: BACTROBAN Place 1 application into the nose 2 (two) times daily.   Resource ThickenUp Clear Powd Take 125 g by mouth as needed.   rivastigmine 13.3 MG/24HR Commonly known as: Exelon Apply 1 patch (13.3 mg total) topically daily.   sertraline 25 MG tablet Commonly known as: ZOLOFT Take 75 mg by mouth daily.   Triple Antibiotic [phone] Oint Apply 1 application topically as needed (minor skin tears or abrasions).      Allergies  Allergen  Reactions  . Penicillins Other (See Comments)    Has patient had a PCN reaction causing immediate rash, facial/tongue/throat swelling, SOB or lightheadedness with hypotension: Unkown PCN reaction causing severe rash involving mucus membranes or skin necrosis: Unknown PCN reaction that required hospitalization: Unknown PCN reaction occurring within the last 10 years: No If all of the above answers are "NO", then may proceed with Cephalosporin use. Tolerated Cefepime, Ceftriaxone (06/2018)     The results of significant diagnostics from this hospitalization (including imaging, microbiology, ancillary and laboratory) are listed below for reference.    Significant Diagnostic Studies: DG Chest Port 1 View  Result Date: 09/29/2019 CLINICAL DATA:  Shortness of breath. EXAM: PORTABLE CHEST 1 VIEW COMPARISON:  July 24, 2018 FINDINGS: The lungs are hyperinflated. There is no evidence of acute infiltrate, pleural effusion or pneumothorax. The heart size and mediastinal contours are within normal limits. Chronic ninth and tenth right rib fractures are seen. The visualized skeletal structures are otherwise unremarkable. IMPRESSION: No active disease. Electronically Signed   By: Virgina Norfolk M.D.   On: 09/29/2019 20:49   ECHOCARDIOGRAM LIMITED  Result Date: 10/02/2019   ECHOCARDIOGRAM LIMITED REPORT   Patient Name:   Toni Sullivan Date of Exam: 10/02/2019 Medical Rec #:  [id number]        Height:       64.0 in Accession #:    [id number]       Weight:       120.0 lb Date of Birth:  Apr 18, 1951         BSA:          1.57 m Patient Age:    39 years         BP:           89/67 mmHg Patient Gender: F                HR:           67 bpm. Exam Location:  Inpatient  Procedure: Limited Echo Indications:    elevated troponin  History:        Patient has prior history of Echocardiogram examinations, most                 recent 07/22/2018.  Sonographer:    Jannett Celestine RDCS (AE) Referring Phys: [id number] Telisa Ohlsen   Sonographer Comments: No parasternal window and suboptimal subcostal window. Image acquisition challenging due to uncooperative patient. restricted mobility. see comments IMPRESSIONS  1. Left ventricular ejection fraction, by visual estimation, is 35 to 40%. Akinesis of the apex and midventricle in a noncoronary distribution, with hyperdynamic base. Findings could be consistent with ischemia, however strongly suspect Takotsubo (stress) cardiomyopathy.  2. The left ventricle demonstrates regional wall motion abnormalities.  3. Evidence of LVOT obstruction with flow acceleration at the basal septum, with systolic anterior motion of the mitral valve, with posteriorly directed mitral regurgitation. Maximum instantanteous gradient 16 mmHg (velocity 2 m/s).  4. Global right ventricle has normal systolc function.The right ventricular size is normal.  5. Aortic valve has normal leaflet excursion, suggesting no significant aortic valve stenosis.  6. The inferior vena cava is dilated in size with >50% respiratory variability, suggesting right atrial pressure of 8 mmHg. FINDINGS  Left Ventricle: Left ventricular ejection fraction, by visual estimation, is 35 to 40%. The left ventricle demonstrates regional wall motion abnormalities. Right Ventricle: The right ventricular size is normal. Global RV systolic function is has normal systolic function. Aortic Valve: The aortic valve is tricuspid. Aortic valve regurgitation is trivial. Venous: The inferior vena cava is dilated in size with greater than 50% respiratory variability, suggesting right atrial pressure of 8 mmHg.  LEFT VENTRICLE          Normals PLAX 2D LVOT diam:     2.00 cm  2.0 cm LVOT Area:     3.14 cm 3.14 cm2   SHUNTS Systemic Diam: 2.00 cm  Weston Brass MD Electronically signed by Weston Brass MD Signature Date/Time: 10/02/2019/4:11:11 PM    Final     Microbiology: Recent Results (from the past 240 hour(s))  Blood Culture (routine x 2)     Status: None    Collection Time: 09/29/19  8:00 PM   Specimen: BLOOD LEFT WRIST  Result Value Ref Range Status   Specimen Description BLOOD LEFT WRIST  Final   Special Requests   Final    BOTTLES DRAWN AEROBIC AND ANAEROBIC Blood Culture results may not be optimal due to an inadequate volume of blood received in culture bottles   Culture   Final    NO GROWTH 5 DAYS Performed at [facility], [address], [place], [place] [address]    Report Status 10/04/2019 FINAL  Final  Blood Culture (routine x 2)     Status: None   Collection Time: 09/29/19  8:30 PM   Specimen: BLOOD RIGHT ARM  Result Value Ref Range Status   Specimen Description BLOOD RIGHT ARM  Final   Special Requests   Final    BOTTLES DRAWN AEROBIC AND ANAEROBIC Blood Culture adequate volume   Culture   Final    NO GROWTH 5 DAYS Performed at [facility], [address] [place], [place] [address]    Report Status 10/04/2019 FINAL  Final  Respiratory Panel by RT PCR (Flu A&B, Covid) - Nasopharyngeal Swab     Status: Abnormal   Collection Time: 09/29/19  8:56 PM   Specimen: Nasopharyngeal Swab  Result Value Ref Range Status   SARS Coronavirus 2 by RT PCR POSITIVE (A) NEGATIVE Final    Comment: RESULT CALLED TO, READ BACK BY AND VERIFIED WITH: A. CAIN,RN [id number] 09/29/2019 T. TYSOR (NOTE) SARS-CoV-2 target nucleic acids are DETECTED. SARS-CoV-2 RNA is generally detectable in upper respiratory specimens  during the acute phase of infection. Positive results are indicative of the presence of the identified virus, but do not rule out bacterial infection or co-infection with other pathogens not detected by the  test. Clinical correlation with patient history and other diagnostic information is necessary to determine patient infection status. The expected result is Negative. Fact Sheet for Patients:  https://www.moore.com/ Fact Sheet for Healthcare Providers: https://www.young.biz/ This  test is not yet approved or cleared by the [place] FDA and  has been authorized for detection and/or diagnosis of SARS-CoV-2 by FDA under an Emergency Use Authorization (EUA).  This EUA will remain in effect (meaning this test can be used) f or the duration of  the COVID-19 declaration under Section 564(b)(1) of the Act, 21 U.S.C. section 360bbb-3(b)(1), unless the authorization is terminated or revoked sooner.    Influenza A by PCR NEGATIVE NEGATIVE Final   Influenza B by PCR NEGATIVE NEGATIVE Final    Comment: (NOTE) The Xpert Xpress SARS-CoV-2/FLU/RSV assay is intended as an aid in  the diagnosis of influenza from Nasopharyngeal swab specimens and  should not be used as a sole basis for treatment. Nasal washings and  aspirates are unacceptable for Xpert Xpress SARS-CoV-2/FLU/RSV  testing. Fact Sheet for Patients: https://www.moore.com/ Fact Sheet for Healthcare Providers: https://www.young.biz/ This test is not yet approved or cleared by the [place] FDA and  has been authorized for detection and/or diagnosis of SARS-CoV-2 by  FDA under an Emergency Use Authorization (EUA). This EUA will remain  in effect (meaning this test can be used) for the duration of the  Covid-19 declaration under Section 564(b)(1) of the Act, 21  U.S.C. section 360bbb-3(b)(1), unless the authorization is  terminated or revoked. Performed at [facility], [address], [place], [place] [address]   Urine culture     Status: Abnormal   Collection Time: 09/29/19  9:53 PM   Specimen: In/Out Cath Urine  Result Value Ref Range Status   Specimen Description IN/OUT CATH URINE  Final   Special Requests   Final    NONE Performed at [facility], [address] [place], [place] [address]    Culture (A)  Final    100 COLONIES/mL PROTEUS MIRABILIS 10,000 COLONIES/mL RAOULTELLA PLANTICOLA    Report Status 10/04/2019 FINAL  Final   Organism ID,  Bacteria PROTEUS MIRABILIS (A)  Final   Organism ID, Bacteria RAOULTELLA PLANTICOLA (A)  Final      Susceptibility   Proteus mirabilis - MIC*    AMPICILLIN <=2 SENSITIVE Sensitive     CEFAZOLIN <=4 SENSITIVE Sensitive     CEFTRIAXONE <=0.25 SENSITIVE Sensitive     CIPROFLOXACIN <=0.25 SENSITIVE Sensitive     GENTAMICIN <=1 SENSITIVE Sensitive     IMIPENEM 1 SENSITIVE Sensitive     NITROFURANTOIN RESISTANT Resistant     TRIMETH/SULFA <=20 SENSITIVE Sensitive     AMPICILLIN/SULBACTAM <=2 SENSITIVE Sensitive     PIP/TAZO <=4 SENSITIVE Sensitive     * 100 COLONIES/mL PROTEUS MIRABILIS   Raoultella planticola - MIC*    AMPICILLIN >=32 RESISTANT Resistant     CEFAZOLIN <=4 SENSITIVE Sensitive     CEFTRIAXONE <=0.25 SENSITIVE Sensitive     CIPROFLOXACIN <=0.25 SENSITIVE Sensitive     GENTAMICIN <=1 SENSITIVE Sensitive     IMIPENEM <=0.25 SENSITIVE Sensitive     NITROFURANTOIN 32 SENSITIVE Sensitive     TRIMETH/SULFA <=20 SENSITIVE Sensitive     AMPICILLIN/SULBACTAM 4 SENSITIVE Sensitive     PIP/TAZO <=4 SENSITIVE Sensitive     * 10,000 COLONIES/mL RAOULTELLA PLANTICOLA  MRSA PCR Screening     Status: Abnormal   Collection Time: 09/30/19  7:27 AM   Specimen: Nasal Mucosa;  Nasopharyngeal  Result Value Ref Range Status   MRSA by PCR POSITIVE (A) NEGATIVE Final    Comment:        The GeneXpert MRSA Assay (FDA approved for NASAL specimens only), is one component of a comprehensive MRSA colonization surveillance program. It is not intended to diagnose MRSA infection nor to guide or monitor treatment for MRSA infections. RESULT CALLED TO, READ BACK BY AND VERIFIED WITH: Tenna ChildE. Fowler RN 11:50 09/30/19 ([email or web address]) Performed at [facility], [address] [place], [place] [id number]      Labs: Basic Metabolic Panel: Recent Labs  Lab 10/01/19 0239 10/02/19 0501 10/03/19 1838  NA 144 148* 143  K 3.2* 3.9 3.3*  CL 115* 118* 116*  CO2 21* 23 20*  GLUCOSE 99 93 123*  BUN 13 17  15   CREATININE 0.53 0.61 0.59  CALCIUM 8.0* 8.1* 7.8*   Liver Function Tests: No results for input(s): AST, ALT, ALKPHOS, BILITOT, PROT, ALBUMIN in the last 168 hours. No results for input(s): LIPASE, AMYLASE in the last 168 hours. No results for input(s): AMMONIA in the last 168 hours. CBC: Recent Labs  Lab 10/01/19 0239 10/02/19 0501 10/04/19 0603  WBC 5.7 5.9 4.4  NEUTROABS 3.3 2.4 1.9  HGB 11.3* 11.6* 11.0*  HCT 33.1* 35.4* 32.9*  MCV 82.3 85.7 83.3  PLT 164 175 222   Cardiac Enzymes: No results for input(s): CKTOTAL, CKMB, CKMBINDEX, TROPONINI in the last 168 hours. BNP: BNP (last 3 results) Recent Labs    10/04/19 0602  BNP 1,353.8*    ProBNP (last 3 results) No results for input(s): PROBNP in the last 8760 hours.  CBG: No results for input(s): GLUCAP in the last 168 hours.  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  Signed:        Sendy Pluta, DO [facility]  10/07/2019, 8:40 AM

## 2019-10-07 NOTE — TOC Progression Note (Signed)
Transition of Care [facility]) - Progression Note    Patient Details  Name: CARINA CHAPLIN MRN: [id number] Date of Birth: [birth year]  Transition of Care [facility]) CM/SW Contact  Mearl Latin, LCSW Phone Number: 10/07/2019, 4:10 PM  Clinical Narrative:    [facility] able to accept patient back after reviewing DC Summary and FL2. Patient's spouse aware of plan and requests PTAR for transport.    Expected Discharge Plan: Memory Care Barriers to Discharge: No Barriers Identified  Expected Discharge Plan and Services Expected Discharge Plan: Memory Care In-house Referral: Clinical Social Work   Post Acute Care Choice: Nursing Home Living arrangements for the past 2 months: Assisted Living Facility(memory care) Expected Discharge Date: 10/07/19                                     Social Determinants of Health (SDOH) Interventions    Readmission Risk Interventions No flowsheet data found.

## 2019-10-14 DIAGNOSIS — I480 Paroxysmal atrial fibrillation: Secondary | ICD-10-CM | POA: Diagnosis not present

## 2019-10-14 DIAGNOSIS — F0151 Vascular dementia with behavioral disturbance: Secondary | ICD-10-CM | POA: Diagnosis not present

## 2019-10-14 DIAGNOSIS — J1282 Pneumonia due to coronavirus disease 2019: Secondary | ICD-10-CM | POA: Diagnosis not present

## 2019-10-14 DIAGNOSIS — N390 Urinary tract infection, site not specified: Secondary | ICD-10-CM | POA: Diagnosis not present

## 2019-10-14 DIAGNOSIS — G300 Alzheimer's disease with early onset: Secondary | ICD-10-CM | POA: Diagnosis not present

## 2019-10-14 DIAGNOSIS — N179 Acute kidney failure, unspecified: Secondary | ICD-10-CM | POA: Diagnosis not present

## 2019-10-16 DIAGNOSIS — D631 Anemia in chronic kidney disease: Secondary | ICD-10-CM | POA: Diagnosis not present

## 2019-10-16 DIAGNOSIS — E119 Type 2 diabetes mellitus without complications: Secondary | ICD-10-CM | POA: Diagnosis not present

## 2019-10-16 DIAGNOSIS — E034 Atrophy of thyroid (acquired): Secondary | ICD-10-CM | POA: Diagnosis not present

## 2019-10-16 DIAGNOSIS — D649 Anemia, unspecified: Secondary | ICD-10-CM | POA: Diagnosis not present

## 2019-10-22 DIAGNOSIS — I480 Paroxysmal atrial fibrillation: Secondary | ICD-10-CM | POA: Diagnosis not present

## 2019-10-22 DIAGNOSIS — R1310 Dysphagia, unspecified: Secondary | ICD-10-CM | POA: Diagnosis not present

## 2019-10-22 DIAGNOSIS — F0151 Vascular dementia with behavioral disturbance: Secondary | ICD-10-CM | POA: Diagnosis not present

## 2019-10-22 DIAGNOSIS — R54 Age-related physical debility: Secondary | ICD-10-CM | POA: Diagnosis not present

## 2019-10-22 DIAGNOSIS — G300 Alzheimer's disease with early onset: Secondary | ICD-10-CM | POA: Diagnosis not present

## 2019-12-30 DIAGNOSIS — I480 Paroxysmal atrial fibrillation: Secondary | ICD-10-CM | POA: Diagnosis not present

## 2019-12-30 DIAGNOSIS — W050XXA Fall from non-moving wheelchair, initial encounter: Secondary | ICD-10-CM | POA: Diagnosis not present

## 2019-12-30 DIAGNOSIS — R54 Age-related physical debility: Secondary | ICD-10-CM | POA: Diagnosis not present

## 2019-12-30 DIAGNOSIS — F0151 Vascular dementia with behavioral disturbance: Secondary | ICD-10-CM | POA: Diagnosis not present

## 2020-02-27 DEATH — deceased
